# Patient Record
Sex: Male | Born: 1965 | Race: Black or African American | Hispanic: No | Marital: Single | Smoking: Never smoker
Health system: Southern US, Community
[De-identification: ages and names within clinical notes are randomized; demographics above are authoritative.]

## PROBLEM LIST (undated history)

## (undated) DIAGNOSIS — K219 Gastro-esophageal reflux disease without esophagitis: Secondary | ICD-10-CM

## (undated) DIAGNOSIS — Z9889 Other specified postprocedural states: Secondary | ICD-10-CM

## (undated) DIAGNOSIS — R109 Unspecified abdominal pain: Secondary | ICD-10-CM

## (undated) DIAGNOSIS — R9431 Abnormal electrocardiogram [ECG] [EKG]: Secondary | ICD-10-CM

## (undated) DIAGNOSIS — A048 Other specified bacterial intestinal infections: Secondary | ICD-10-CM

## (undated) DIAGNOSIS — M722 Plantar fascial fibromatosis: Secondary | ICD-10-CM

## (undated) DIAGNOSIS — E119 Type 2 diabetes mellitus without complications: Secondary | ICD-10-CM

## (undated) DIAGNOSIS — T7840XA Allergy, unspecified, initial encounter: Secondary | ICD-10-CM

## (undated) DIAGNOSIS — J329 Chronic sinusitis, unspecified: Secondary | ICD-10-CM

## (undated) DIAGNOSIS — R112 Nausea with vomiting, unspecified: Secondary | ICD-10-CM

## (undated) DIAGNOSIS — M1990 Unspecified osteoarthritis, unspecified site: Secondary | ICD-10-CM

## (undated) HISTORY — DX: Other specified bacterial intestinal infections: A04.8

## (undated) HISTORY — DX: Chronic sinusitis, unspecified: J32.9

## (undated) HISTORY — DX: Unspecified abdominal pain: R10.9

## (undated) HISTORY — PX: KNEE ARTHROSCOPY W/ MENISCAL REPAIR: SHX1877

## (undated) HISTORY — DX: Plantar fascial fibromatosis: M72.2

## (undated) HISTORY — DX: Allergy, unspecified, initial encounter: T78.40XA

## (undated) HISTORY — DX: Nausea with vomiting, unspecified: R11.2

## (undated) HISTORY — DX: Other specified postprocedural states: Z98.890

## (undated) HISTORY — DX: Abnormal electrocardiogram (ECG) (EKG): R94.31

## (undated) HISTORY — PX: WISDOM TOOTH EXTRACTION: SHX21

## (undated) HISTORY — DX: Unspecified osteoarthritis, unspecified site: M19.90

## (undated) HISTORY — DX: Gastro-esophageal reflux disease without esophagitis: K21.9

---

## 2008-02-10 ENCOUNTER — Ambulatory Visit: Payer: Self-pay | Admitting: Internal Medicine

## 2008-02-17 ENCOUNTER — Ambulatory Visit: Payer: Self-pay | Admitting: Internal Medicine

## 2008-12-23 ENCOUNTER — Encounter: Payer: Self-pay | Admitting: Cardiovascular Disease

## 2008-12-27 ENCOUNTER — Encounter: Payer: Self-pay | Admitting: Cardiovascular Disease

## 2008-12-28 ENCOUNTER — Ambulatory Visit: Payer: Self-pay | Admitting: Cardiovascular Disease

## 2008-12-28 DIAGNOSIS — K219 Gastro-esophageal reflux disease without esophagitis: Secondary | ICD-10-CM

## 2008-12-28 DIAGNOSIS — R9431 Abnormal electrocardiogram [ECG] [EKG]: Secondary | ICD-10-CM

## 2008-12-28 HISTORY — DX: Abnormal electrocardiogram (ECG) (EKG): R94.31

## 2009-01-11 ENCOUNTER — Ambulatory Visit: Payer: Self-pay

## 2009-01-11 ENCOUNTER — Ambulatory Visit: Payer: Self-pay | Admitting: Cardiovascular Disease

## 2009-01-11 ENCOUNTER — Ambulatory Visit (HOSPITAL_COMMUNITY): Admission: RE | Admit: 2009-01-11 | Discharge: 2009-01-11 | Payer: Self-pay | Admitting: Internal Medicine

## 2009-01-11 ENCOUNTER — Encounter: Payer: Self-pay | Admitting: Cardiovascular Disease

## 2012-04-17 ENCOUNTER — Emergency Department (HOSPITAL_BASED_OUTPATIENT_CLINIC_OR_DEPARTMENT_OTHER)
Admission: EM | Admit: 2012-04-17 | Discharge: 2012-04-17 | Disposition: A | Discharge status: Home / Self Care | Payer: 59 | Attending: Emergency Medicine | Admitting: Emergency Medicine

## 2012-04-17 ENCOUNTER — Emergency Department (HOSPITAL_BASED_OUTPATIENT_CLINIC_OR_DEPARTMENT_OTHER): Payer: 59

## 2012-04-17 ENCOUNTER — Encounter (HOSPITAL_BASED_OUTPATIENT_CLINIC_OR_DEPARTMENT_OTHER): Payer: Self-pay | Admitting: *Deleted

## 2012-04-17 DIAGNOSIS — M25529 Pain in unspecified elbow: Secondary | ICD-10-CM

## 2012-04-17 DIAGNOSIS — Y9389 Activity, other specified: Secondary | ICD-10-CM | POA: Insufficient documentation

## 2012-04-17 DIAGNOSIS — S139XXA Sprain of joints and ligaments of unspecified parts of neck, initial encounter: Secondary | ICD-10-CM | POA: Insufficient documentation

## 2012-04-17 DIAGNOSIS — S59909A Unspecified injury of unspecified elbow, initial encounter: Secondary | ICD-10-CM | POA: Insufficient documentation

## 2012-04-17 DIAGNOSIS — M25569 Pain in unspecified knee: Secondary | ICD-10-CM

## 2012-04-17 DIAGNOSIS — S6990XA Unspecified injury of unspecified wrist, hand and finger(s), initial encounter: Secondary | ICD-10-CM | POA: Insufficient documentation

## 2012-04-17 DIAGNOSIS — S8990XA Unspecified injury of unspecified lower leg, initial encounter: Secondary | ICD-10-CM | POA: Insufficient documentation

## 2012-04-17 DIAGNOSIS — S0003XA Contusion of scalp, initial encounter: Secondary | ICD-10-CM | POA: Insufficient documentation

## 2012-04-17 DIAGNOSIS — S161XXA Strain of muscle, fascia and tendon at neck level, initial encounter: Secondary | ICD-10-CM

## 2012-04-17 DIAGNOSIS — Y92410 Unspecified street and highway as the place of occurrence of the external cause: Secondary | ICD-10-CM | POA: Insufficient documentation

## 2012-04-17 DIAGNOSIS — T148XXA Other injury of unspecified body region, initial encounter: Secondary | ICD-10-CM

## 2012-04-17 MED ORDER — HYDROCODONE-ACETAMINOPHEN 5-325 MG PO TABS: 2.0000 | ORAL_TABLET | ORAL | Status: DC | PRN

## 2012-04-17 MED ORDER — CYCLOBENZAPRINE HCL 5 MG PO TABS: 5.0000 mg | ORAL_TABLET | Freq: Two times a day (BID) | ORAL | Status: DC | PRN
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
## 2012-04-17 NOTE — ED Provider Notes (Signed)
Medical screening examination/treatment/procedure(s) were performed by non-physician practitioner and as supervising physician I was immediately available for consultation/collaboration.   Yuvonne Lanahan, MD 04/17/12 2333 

## 2013-01-27 ENCOUNTER — Ambulatory Visit: Payer: Self-pay | Admitting: Podiatry

## 2013-02-23 ENCOUNTER — Encounter: Payer: Self-pay | Admitting: Internal Medicine

## 2013-07-08 ENCOUNTER — Encounter: Payer: Self-pay | Admitting: Internal Medicine

## 2013-07-28 ENCOUNTER — Ambulatory Visit (INDEPENDENT_AMBULATORY_CARE_PROVIDER_SITE_OTHER): Payer: PRIVATE HEALTH INSURANCE | Admitting: Family Medicine

## 2013-07-28 ENCOUNTER — Encounter: Payer: Self-pay | Admitting: Family Medicine

## 2013-07-28 VITALS — BP 124/80 | HR 85 | Temp 98.1°F | Ht 73.0 in | Wt 236.5 lb

## 2013-07-28 DIAGNOSIS — M170 Bilateral primary osteoarthritis of knee: Secondary | ICD-10-CM

## 2013-07-28 DIAGNOSIS — R109 Unspecified abdominal pain: Secondary | ICD-10-CM

## 2013-07-28 DIAGNOSIS — M1710 Unilateral primary osteoarthritis, unspecified knee: Secondary | ICD-10-CM

## 2013-07-28 DIAGNOSIS — K219 Gastro-esophageal reflux disease without esophagitis: Secondary | ICD-10-CM

## 2013-07-28 DIAGNOSIS — M722 Plantar fascial fibromatosis: Secondary | ICD-10-CM

## 2013-07-28 DIAGNOSIS — IMO0002 Reserved for concepts with insufficient information to code with codable children: Secondary | ICD-10-CM

## 2013-07-28 DIAGNOSIS — A048 Other specified bacterial intestinal infections: Secondary | ICD-10-CM

## 2013-07-28 NOTE — Progress Notes (Signed)
No chief complaint on file.   HPI:  Marcus Wilkins is here to establish care.  Last PCP and physical:  Has the following chronic problems and concerns today:  Patient Active Problem List   Diagnosis Date Noted  . Plantar fasciitis 07/28/2013  . Osteoarthritis of both knees 07/28/2013  . H. pylori infection 07/28/2013  . GERD 12/28/2008   R upper abd pain: -started several months ago, then started hurting in LRQ -on and off -burning pain -[facility] and reports then did labs and reported told infectionand ulcer and treated with a month of antibiotics -reports feeling better but still has occ burning in specific area and has "knot" there -denies: fevers, diarrhea, vomiting, nausea, vomiting, blood in stool, melena, weight loss -does have acid reflux -reports has colonoscopy set up for next month - he wants to see Dr. Leone PayorGessner for appt for this pain -when his abd pain is bad his chest hurts as well - hx stress test 1 year ago per his report  Health Maintenance: -will set up physical  ROS: See pertinent positives and negatives per HPI.  Past Medical History  Diagnosis Date  . Arthritis   . GERD (gastroesophageal reflux disease)   . Ulcer   . Allergy   . Plantar fasciitis   . ELECTROCARDIOGRAM, ABNORMAL 12/28/2008    Qualifier: Diagnosis of  By: Eden EmmsNishan, MD, Harrington ChallengerFACC, Peter Charles     Family History  Problem Relation Age of Onset  . Stroke Mother   . Heart disease Mother     valvular  . Stroke Father   . Diabetes Father   . Hypertension Father     History   Social History  . Marital Status: Single    Spouse Name: N/A    Number of Children: N/A  . Years of Education: N/A   Social History Main Topics  . Smoking status: Never Smoker   . Smokeless tobacco: None  . Alcohol Use: Yes     Comment: 1-2 drinks a few times per year  . Drug Use: No  . Sexual Activity: No   Other Topics Concern  . 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  GENERAL: vitals reviewed and listed above, alert, oriented, appears well hydrated and in no acute distress  HEENT: atraumatic, conjunttiva clear, no obvious abnormalities on inspection of external nose and ears  NECK: no obvious masses on inspection  LUNGS: clear to auscultation bilaterally, no wheezes, rales or rhonchi, good air movement  CV: HRRR, no peripheral edema  MS: moves all extremities without noticeable abnormality  PSYCH: pleasant and cooperative, no obvious depression or anxiety  ASSESSMENT AND PLAN:  Discussed the following assessment and plan:  GERD - Plan: Ambulatory referral to Gastroenterology  H. pylori infection - Plan: Ambulatory  referral to Gastroenterology  Abdominal pain - Plan: Ambulatory referral to Gastroenterology  Plantar fasciitis  Osteoarthritis of both knees  -We reviewed the PMH, PSH, FH, SH, Meds and Allergies. -We provided refills for any medications we will prescribe as needed. -We addressed current concerns per orders and patient instructions. -We have asked for records for pertinent exams, studies, vaccines and notes from previous providers. -We have advised patient to follow up per instructions below. -advised assistant to get records from urgent care to review -he will complete tx for likely h. Pylori, referral per his request -follow up 1 month for CPE and labs -offered tdap - declined today but wants to get with his  physical  -Patient advised to return or notify a doctor immediately if symptoms worsen or persist or new concerns arise.  Patient Instructions  -We placed a referral for you as discussed to the gastroenterologist. It usually takes about 1-2 weeks to process and schedule this referral. If you have not heard from us regarding this appointment in 2 weeks please contact our office.  -Set up appointment for physical exam and come fasting - drink plenty of water that morning     Terressa KoyanagiHannah R. Kylyn Sookram

## 2013-07-28 NOTE — Progress Notes (Signed)
Pre visit review using our clinic review tool, if applicable. No additional management support is needed unless otherwise documented below in the visit note. 

## 2013-07-28 NOTE — Patient Instructions (Signed)
-  We placed a referral for you as discussed to the gastroenterologist. It usually takes about 1-2 weeks to process and schedule this referral. If you have not heard from us regarding this appointment in 2 weeks please contact our office.  -Set up appointment for physical exam and come fasting - drink plenty of water that morning

## 2013-07-30 ENCOUNTER — Encounter: Payer: Self-pay | Admitting: Family Medicine

## 2013-07-31 ENCOUNTER — Ambulatory Visit: Payer: 59 | Admitting: Internal Medicine

## 2013-08-18 ENCOUNTER — Ambulatory Visit (AMBULATORY_SURGERY_CENTER): Payer: PRIVATE HEALTH INSURANCE

## 2013-08-18 VITALS — Ht 73.5 in | Wt 236.0 lb

## 2013-08-18 DIAGNOSIS — Z800 Family history of malignant neoplasm of digestive organs: Secondary | ICD-10-CM

## 2013-08-18 MED ORDER — NA SULFATE-K SULFATE-MG SULF 17.5-3.13-1.6 GM/177ML PO SOLN: ORAL | Status: DC

## 2013-08-18 NOTE — Progress Notes (Signed)
Per pt, no allergies to soy or egg products.Pt not taking any weight loss meds or using  O2 at home.Pt states he has had post-op nausea and vomiting 1 time

## 2013-08-21 ENCOUNTER — Other Ambulatory Visit (INDEPENDENT_AMBULATORY_CARE_PROVIDER_SITE_OTHER): Payer: PRIVATE HEALTH INSURANCE

## 2013-08-21 DIAGNOSIS — R17 Unspecified jaundice: Secondary | ICD-10-CM

## 2013-08-21 DIAGNOSIS — E785 Hyperlipidemia, unspecified: Secondary | ICD-10-CM

## 2013-08-21 DIAGNOSIS — Z0000 Encounter for general adult medical examination without abnormal findings: Secondary | ICD-10-CM

## 2013-08-21 LAB — TSH: TSH: 1.26 u[IU]/mL (ref 0.35–4.50)

## 2013-08-21 LAB — POCT URINALYSIS DIPSTICK
Bilirubin, UA: NEGATIVE
Blood, UA: NEGATIVE
Glucose, UA: NEGATIVE
Ketones, UA: NEGATIVE
LEUKOCYTES UA: NEGATIVE
NITRITE UA: NEGATIVE
PH UA: 6
PROTEIN UA: NEGATIVE
Spec Grav, UA: 1.025
UROBILINOGEN UA: 1

## 2013-08-21 LAB — BASIC METABOLIC PANEL
BUN: 13 mg/dL (ref 6–23)
CALCIUM: 9.3 mg/dL (ref 8.4–10.5)
CO2: 27 meq/L (ref 19–32)
Chloride: 103 mEq/L (ref 96–112)
Creatinine, Ser: 1 mg/dL (ref 0.4–1.5)
GFR: 102.84 mL/min (ref >60.00)
GLUCOSE: 86 mg/dL (ref 70–99)
POTASSIUM: 4.2 meq/L (ref 3.5–5.1)
SODIUM: 139 meq/L (ref 135–145)

## 2013-08-21 LAB — LIPID PANEL
CHOL/HDL RATIO: 5
Cholesterol: 179 mg/dL (ref 0–200)
HDL: 36.3 mg/dL — AB (ref >39.00)
LDL Cholesterol: 110 mg/dL — ABNORMAL HIGH (ref 0–99)
Triglycerides: 165 mg/dL — ABNORMAL HIGH (ref 0.0–149.0)
VLDL: 33 mg/dL (ref 0.0–40.0)

## 2013-08-21 LAB — CBC WITH DIFFERENTIAL/PLATELET
BASOS ABS: 0 10*3/uL (ref 0.0–0.1)
BASOS PCT: 0.4 % (ref 0.0–3.0)
Eosinophils Absolute: 0.4 10*3/uL (ref 0.0–0.7)
Eosinophils Relative: 6.2 % — ABNORMAL HIGH (ref 0.0–5.0)
HEMATOCRIT: 48.7 % (ref 39.0–52.0)
HEMOGLOBIN: 16.1 g/dL (ref 13.0–17.0)
LYMPHS ABS: 2.1 10*3/uL (ref 0.7–4.0)
LYMPHS PCT: 29.6 % (ref 12.0–46.0)
MCHC: 33 g/dL (ref 30.0–36.0)
MCV: 88.6 fl (ref 78.0–100.0)
MONOS PCT: 7.7 % (ref 3.0–12.0)
Monocytes Absolute: 0.6 10*3/uL (ref 0.1–1.0)
NEUTROS ABS: 4 10*3/uL (ref 1.4–7.7)
Neutrophils Relative %: 56.1 % (ref 43.0–77.0)
Platelets: 215 10*3/uL (ref 150.0–400.0)
RBC: 5.5 Mil/uL (ref 4.22–5.81)
RDW: 14.5 % (ref 11.5–15.5)
WBC: 7.2 10*3/uL (ref 4.0–10.5)

## 2013-08-21 LAB — HEPATIC FUNCTION PANEL
ALBUMIN: 3.9 g/dL (ref 3.5–5.2)
ALT: 30 U/L (ref 0–53)
AST: 23 U/L (ref 0–37)
Alkaline Phosphatase: 74 U/L (ref 39–117)
Bilirubin, Direct: 0.3 mg/dL (ref 0.0–0.3)
TOTAL PROTEIN: 7.1 g/dL (ref 6.0–8.3)
Total Bilirubin: 1.8 mg/dL — ABNORMAL HIGH (ref 0.2–1.2)

## 2013-08-21 LAB — PSA: PSA: 1.17 ng/mL (ref 0.10–4.00)

## 2013-08-28 ENCOUNTER — Ambulatory Visit (INDEPENDENT_AMBULATORY_CARE_PROVIDER_SITE_OTHER): Payer: PRIVATE HEALTH INSURANCE | Admitting: Family Medicine

## 2013-08-28 ENCOUNTER — Encounter: Payer: Self-pay | Admitting: Family Medicine

## 2013-08-28 VITALS — BP 126/82 | HR 67 | Temp 98.0°F | Ht 73.75 in | Wt 237.0 lb

## 2013-08-28 DIAGNOSIS — Z0000 Encounter for general adult medical examination without abnormal findings: Secondary | ICD-10-CM

## 2013-08-28 DIAGNOSIS — R17 Unspecified jaundice: Secondary | ICD-10-CM

## 2013-08-28 DIAGNOSIS — Z23 Encounter for immunization: Secondary | ICD-10-CM

## 2013-08-28 DIAGNOSIS — R1011 Right upper quadrant pain: Secondary | ICD-10-CM

## 2013-08-28 DIAGNOSIS — A048 Other specified bacterial intestinal infections: Secondary | ICD-10-CM

## 2013-08-28 DIAGNOSIS — E785 Hyperlipidemia, unspecified: Secondary | ICD-10-CM

## 2013-08-28 NOTE — Progress Notes (Signed)
No chief complaint on file.   HPI:  Annual Exam:  Follow up and/or new issues:  1) GERD/Hx H. Pylori Infection 06/2013: -referred to GI for ongoing symptoms after tx on 07/28/13 -reports:is doing better, has colonoscopy this Tuesday, intermittent pain in RUQ daily, mild-mod, sharp - not sure if related to meals - reports he is just going to get the colonscopy and talk to the doctor at his visit per his discussion with GI office -denies: fevers, malaise, anorexia, NVD, constipation, hematochezia, melena, did have elevated bili on labs, reflux, heartburn, trouble swallowing, alcohol use  2) HLD: -mild, discussed -he prefers to work on diet and exercise  HM: -diet and exercise: no CV, diet is healthier - cutting back on fast food and red meat -vaccines: getting tdap today -lipids/diabetes screening: done and cholesterol a little elevated -STI screening: declined  -see social hx for substance use  Review of Systems  Constitutional: Negative for fever, chills, weight loss and malaise/fatigue.  HENT: Negative for ear pain and hearing loss.   Eyes: Negative for blurred vision and pain.  Respiratory: Negative for cough and shortness of breath.   Cardiovascular: Negative for chest pain, palpitations and leg swelling.  Gastrointestinal: Negative for diarrhea, blood in stool and melena.  Genitourinary: Negative for dysuria.  Musculoskeletal: Negative for falls and joint pain.  Skin: Negative for rash.  Neurological: Negative for dizziness and loss of consciousness.  Endo/Heme/Allergies: Does not bruise/bleed easily.  Psychiatric/Behavioral: Negative for depression. The patient is not nervous/anxious.      Past Medical History  Diagnosis Date  . Arthritis   . GERD (gastroesophageal reflux disease)   . Ulcer   . Allergy   . Plantar fasciitis   . ELECTROCARDIOGRAM, ABNORMAL 12/28/2008    Qualifier: Diagnosis of  By: Eden EmmsNishan, MD, Harrington ChallengerFACC, Peter Charles   . Abdominal pain     right side  radiating to left side  . Post-operative nausea and vomiting     1 time    Past Surgical History  Procedure Laterality Date  . Knee arthroscopy w/ meniscal repair      bilateral  . Wisdom tooth extraction      Family History  Problem Relation Age of Onset  . Stroke Mother   . Heart disease Mother     valvular  . Colon cancer Mother   . Stroke Father   . Diabetes Father   . Hypertension Father     History   Social History  . Marital Status: Single    Spouse Name: N/A    Number of Children: N/A  . Years of Education: N/A   Social History Main Topics  . Smoking status: Never Smoker   . Smokeless tobacco: Never Used  . Alcohol Use: No     Comment: 1-2 drinks a few times per year  . Drug Use: No  . Sexual Activity: No   Other Topics Concern  . 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  EXAM:  Filed Vitals:   08/28/13 1446  BP: 126/82  Pulse: 67  Temp: 98 F (36.7 C)    Body mass index is 30.64 kg/(m^2).  GENERAL: vitals reviewed and listed above, alert, oriented, appears well hydrated and in no acute distress  HEENT: atraumatic, conjunttiva clear, no obvious abnormalities on inspection of external nose and ears  NECK: no obvious masses on inspection  LUNGS: clear to auscultation bilaterally, no wheezes, rales or rhonchi, good air movement  CV: HRRR, no peripheral edema  ABD: BS+, soft, NTTP, no rebound or guarding, no appreciable organomegaly  MS: moves all extremities without noticeable abnormality  PSYCH: pleasant and cooperative, no obvious depression or anxiety  ASSESSMENT AND PLAN:  Discussed the following assessment and plan:  Visit for preventive health examination  Hyperlipemia  RUQ pain  Elevated  bilirubin  H. pylori infection  -USPSTF level A and B recs discussed -LABS including PSA done and discussed today -offered tdap: getting today -advised he see GI for his intermittent persistent daily RUQ pain and elevated t. Bili (referral placed last visit) -  will have office staff check on status of this and see if they wish to see him prior to doing colonoscopy or reschedule colonoscopy in case EGD is warranted to avoid sedation twice -discussed options for HLD, pt will do trial of diet and exercise and follow up in 4-6 months for this -Patient advised to return or notify a doctor immediately if symptoms worsen or persist or new concerns arise.  There are no Patient Instructions on file for this visit.   Terressa Koyanagi

## 2013-08-28 NOTE — Progress Notes (Signed)
Pre visit review using our clinic review tool, if applicable. No additional management support is needed unless otherwise documented below in the visit note. 

## 2013-09-01 ENCOUNTER — Ambulatory Visit (AMBULATORY_SURGERY_CENTER): Payer: PRIVATE HEALTH INSURANCE | Admitting: Internal Medicine

## 2013-09-01 ENCOUNTER — Encounter: Payer: Self-pay | Admitting: Internal Medicine

## 2013-09-01 ENCOUNTER — Telehealth: Payer: Self-pay

## 2013-09-01 VITALS — BP 118/85 | HR 69 | Temp 97.6°F | Resp 26 | Ht 73.5 in | Wt 236.0 lb

## 2013-09-01 DIAGNOSIS — Z800 Family history of malignant neoplasm of digestive organs: Secondary | ICD-10-CM

## 2013-09-01 DIAGNOSIS — R1011 Right upper quadrant pain: Secondary | ICD-10-CM

## 2013-09-01 DIAGNOSIS — Z1211 Encounter for screening for malignant neoplasm of colon: Secondary | ICD-10-CM

## 2013-09-01 MED ORDER — SODIUM CHLORIDE 0.9 % IV SOLN: 500.0000 mL | INTRAVENOUS | Status: DC

## 2013-09-01 NOTE — Patient Instructions (Addendum)
Your colonoscopy was normal. Next routine colonoscopy in 5 years - 2020  My RN will arrange an ultrasound to evaluate your abdominal pain.  I appreciate the opportunity to care for you. Iva Boop, MD, [facility]   Discharge instructions given with verbal understanding. Normal exam. Office will schedule ultrasound. Resume previous medications. YOU HAD AN ENDOSCOPIC PROCEDURE TODAY AT THE [facility]: Refer to the procedure report that was given to you for any specific questions about what was found during the examination.  If the procedure report does not answer your questions, please call your gastroenterologist to clarify.  If you requested that your care partner not be given the details of your procedure findings, then the procedure report has been included in a sealed envelope for you to review at your convenience later.  YOU SHOULD EXPECT: Some feelings of bloating in the abdomen. Passage of more gas than usual.  Walking can help get rid of the air that was put into your GI tract during the procedure and reduce the bloating. If you had a lower endoscopy (such as a colonoscopy or flexible sigmoidoscopy) you may notice spotting of blood in your stool or on the toilet paper. If you underwent a bowel prep for your procedure, then you may not have a normal bowel movement for a few days.  DIET: Your first meal following the procedure should be a light meal and then it is ok to progress to your normal diet.  A half-sandwich or bowl of soup is an example of a good first meal.  Heavy or fried foods are harder to digest and may make you feel nauseous or bloated.  Likewise meals heavy in dairy and vegetables can cause extra gas to form and this can also increase the bloating.  Drink plenty of fluids but you should avoid alcoholic beverages for 24 hours.  ACTIVITY: Your care partner should take you home directly after the procedure.  You should plan to take it easy, moving slowly for the rest  of the day.  You can resume normal activity the day after the procedure however you should NOT DRIVE or use heavy machinery for 24 hours (because of the sedation medicines used during the test).    SYMPTOMS TO REPORT IMMEDIATELY: A gastroenterologist can be reached at any hour.  During normal business hours, 8:30 AM to 5:00 PM Monday through Friday, call [phone].  After hours and on weekends, please call the GI answering service at [phone] who will take a message and have the physician on call contact you.   Following lower endoscopy (colonoscopy or flexible sigmoidoscopy):  Excessive amounts of blood in the stool  Significant tenderness or worsening of abdominal pains  Swelling of the abdomen that is new, acute  Fever of 100F or higher  FOLLOW UP: If any biopsies were taken you will be contacted by phone or by letter within the next 1-3 weeks.  Call your gastroenterologist if you have not heard about the biopsies in 3 weeks.  Our staff will call the home number listed on your records the next business day following your procedure to check on you and address any questions or concerns that you may have at that time regarding the information given to you following your procedure. This is a courtesy call and so if there is no answer at the home number and we have not heard from you through the emergency physician on call, we will assume that you have returned to your  regular daily activities without incident.  SIGNATURES/CONFIDENTIALITY: You and/or your care partner have signed paperwork which will be entered into your electronic medical record.  These signatures attest to the fact that that the information above on your After Visit Summary has been reviewed and is understood.  Full responsibility of the confidentiality of this discharge information lies with you and/or your care-partner.

## 2013-09-01 NOTE — Progress Notes (Signed)
Pt stable to RR 

## 2013-09-01 NOTE — Op Note (Signed)
[facility] [address] [place], [address]   COLONOSCOPY PROCEDURE REPORT  PATIENT: Marcus Wilkins, Marcus Wilkins  MR#: [id number] BIRTHDATE: 1966/01/17 , 48  yrs. old GENDER: Male ENDOSCOPIST: Iva Boop, MD, [facility] PROCEDURE DATE:  09/01/2013 PROCEDURE:   Colonoscopy, screening First Screening Colonoscopy - Avg.  risk and is 50 yrs.  old or older - No.  Prior Negative Screening - Now for repeat screening. Above average risk  History of Adenoma - Now for follow-up colonoscopy & has been > or = to 3 yrs.  N/A  Polyps Removed Today? No.  Recommend repeat exam, <10 yrs? Yes.  High risk (family or personal hx). ASA CLASS:   Class I INDICATIONS:elevated risk screening and Patient's immediate family history of colon cancer. MEDICATIONS: propofol (Diprivan) 300mg  IV, MAC sedation, administered by CRNA, and These medications were titrated to patient response per physician's verbal order  DESCRIPTION OF PROCEDURE:   After the risks benefits and alternatives of the procedure were thoroughly explained, informed consent was obtained.  A digital rectal exam revealed no abnormalities of the rectum.   The LB [id number]  endoscope was introduced through the anus and advanced to the cecum, which was identified by both the appendix and ileocecal valve. No adverse events experienced.   The quality of the prep was excellent using Suprep  The instrument was then slowly withdrawn as the colon was fully examined.      COLON FINDINGS: A normal appearing cecum, ileocecal valve, and appendiceal orifice were identified.  The ascending, hepatic flexure, transverse, splenic flexure, descending, sigmoid colon and rectum appeared unremarkable.  No polyps or cancers were seen.   A right colon retroflexion was performed.  Retroflexed views revealed no abnormalities. The time to cecum=2 minutes 04 seconds. Withdrawal time=6 minutes 40 seconds.  The scope was withdrawn and the procedure  completed. COMPLICATIONS: There were no complications.  ENDOSCOPIC IMPRESSION: Normal colonoscopy - excellent prep - FHx colon cancer mom in 50's  RECOMMENDATIONS: 1.  Repeat Colonoscopy in 5 years. 2.   My office will arrange for abdominal ultrasound to evaluate intermittent RUQ pain   eSigned:  Iva Boop, MD, [facility] 09/01/2013 9:53 AM   cc: The Patient  and Kriste Basque, DO

## 2013-09-01 NOTE — Telephone Encounter (Signed)
I have left a message for the patient to call and discuss abdominal Ultrasound scheduled at [facility] for 09/08/13 8:00

## 2013-09-02 ENCOUNTER — Telehealth: Payer: Self-pay

## 2013-09-02 NOTE — Telephone Encounter (Signed)
Patient's father notified of the details of the [place] he will have the patient call back for any additional questions or concerns

## 2013-09-02 NOTE — Telephone Encounter (Signed)
Left a message at [phone] for the pt to call if any questions or concerns. Maw

## 2013-09-08 ENCOUNTER — Ambulatory Visit (HOSPITAL_COMMUNITY)
Admission: RE | Admit: 2013-09-08 | Discharge: 2013-09-08 | Disposition: A | Discharge status: Home / Self Care | Payer: 59 | Source: Ambulatory Visit | Attending: Internal Medicine | Admitting: Internal Medicine

## 2013-09-08 ENCOUNTER — Other Ambulatory Visit: Payer: Self-pay

## 2013-09-08 DIAGNOSIS — R1011 Right upper quadrant pain: Secondary | ICD-10-CM

## 2013-09-08 DIAGNOSIS — K8020 Calculus of gallbladder without cholecystitis without obstruction: Secondary | ICD-10-CM | POA: Insufficient documentation

## 2013-09-08 DIAGNOSIS — K819 Cholecystitis, unspecified: Secondary | ICD-10-CM

## 2013-09-08 NOTE — Progress Notes (Signed)
Quick Note:  US shows gallstones which may be cause of his sxs  I recommend he see a surgeon re: ? Symptomatic cholelithiasis  Please get him an appointment with Dr. Dwain SarnaWakefield ______

## 2013-09-29 ENCOUNTER — Encounter (INDEPENDENT_AMBULATORY_CARE_PROVIDER_SITE_OTHER): Payer: Self-pay | Admitting: General Surgery

## 2013-09-29 ENCOUNTER — Ambulatory Visit (INDEPENDENT_AMBULATORY_CARE_PROVIDER_SITE_OTHER): Payer: 59 | Admitting: General Surgery

## 2013-09-29 VITALS — BP 126/77 | HR 70 | Temp 97.0°F | Resp 16 | Ht 73.5 in | Wt 238.8 lb

## 2013-09-29 DIAGNOSIS — K8020 Calculus of gallbladder without cholecystitis without obstruction: Secondary | ICD-10-CM

## 2013-09-29 NOTE — Patient Instructions (Signed)

## 2013-09-29 NOTE — Progress Notes (Signed)
Patient ID: Marcus Wilkins, male   DOB: 1965-06-09, 48 y.o.   MRN: [id number]  Chief Complaint  Patient presents with  . Abdominal Pain    gallbladder    HPI Marcus Wilkins is a 48 y.o. male.  referred by Dr Verl Bangsarl GessnerHPI This is a 48 year old male who has had abdominal pain for close to about 3 years. This comes and goes. He does not really relate this to anything. It has become more frequent and it hurts more. His occurring several times per week. He points to his right upper quadrant when asked where it hurts. Usually only lasts for a number of minutes and then goes away on its own. He has undergone a recent colonoscopy. He has also undergone evaluation for this pain with an ultrasound that shows numerous stones in his gallbladder. He has no nausea or vomiting associated with this. He has no history of any scleral icterus. Past Medical History  Diagnosis Date  . Arthritis   . GERD (gastroesophageal reflux disease)   . Ulcer   . Allergy   . Plantar fasciitis   . ELECTROCARDIOGRAM, ABNORMAL 12/28/2008    Qualifier: Diagnosis of  By: Eden EmmsNishan, MD, Harrington ChallengerFACC, Peter Charles   . Abdominal pain     right side radiating to left side  . Post-operative nausea and vomiting     1 time    Past Surgical History  Procedure Laterality Date  . Knee arthroscopy w/ meniscal repair      bilateral  . Wisdom tooth extraction      Family History  Problem Relation Age of Onset  . Stroke Mother   . Heart disease Mother     valvular  . Colon cancer Mother   . Stroke Father   . Diabetes Father   . Hypertension Father     Social History History  Substance Use Topics  . Smoking status: Never Smoker   . Smokeless tobacco: Never Used  . Alcohol Use: No     Comment: 1-2 drinks a few times per year    No Known Allergies  No current outpatient prescriptions on file.   No current facility-administered medications for this visit.    Review of Systems Review of Systems  Constitutional: Negative for  fever, chills and unexpected weight change.  HENT: Negative for congestion, hearing loss, sore throat, trouble swallowing and voice change.   Eyes: Negative for visual disturbance.  Respiratory: Negative for cough and wheezing.   Cardiovascular: Negative for chest pain, palpitations and leg swelling.  Gastrointestinal: Positive for abdominal pain. Negative for nausea, vomiting, diarrhea, constipation, blood in stool, abdominal distention, anal bleeding and rectal pain.  Genitourinary: Negative for hematuria and difficulty urinating.  Musculoskeletal: Negative for arthralgias.  Skin: Negative for rash and wound.  Neurological: Negative for seizures, syncope, weakness and headaches.  Hematological: Negative for adenopathy. Does not bruise/bleed easily.  Psychiatric/Behavioral: Negative for confusion.    Blood pressure 126/77, pulse 70, temperature 97 F (36.1 C), temperature source Temporal, resp. rate 16, height 6' 1.5" (1.867 m), weight 238 lb 12.8 oz (108.319 kg).  Physical Exam Physical Exam  Vitals reviewed. Constitutional: He is oriented to person, place, and time. He appears well-developed and well-nourished.  Eyes: No scleral icterus.  Neck: Neck supple.  Cardiovascular: Normal rate, regular rhythm and normal heart sounds.   Pulmonary/Chest: Effort normal and breath sounds normal. He has no wheezes. He has no rales.  Abdominal: Soft. Bowel sounds are normal. There is no tenderness.  Lymphadenopathy:  He has no cervical adenopathy.  Neurological: He is alert and oriented to person, place, and time.    Data Reviewed EXAM:  ULTRASOUND ABDOMEN COMPLETE  COMPARISON: None.  FINDINGS:  Gallbladder:  Wall echo shadow sign indicating a gallbladder filled with stones.  Gallbladder wall was not thickened, measuring 2 mm. Sonographic  Murphy sign was negative.  Common bile duct:  Diameter: 4 mm  Liver:  Heterogeneous appearance of the liver with geographic areas of  decreased  echogenicity on a background of increased parenchymal  echogenicity.  IVC:  No abnormality visualized.  Pancreas:  Not visualized.  Spleen:  Size and appearance within normal limits.  Right Kidney:  Length: 11.5 cm. Echogenicity within normal limits. No mass or  hydronephrosis visualized.  Left Kidney:  Length: 11.9 cm. Echogenicity within normal limits. No mass or  hydronephrosis visualized.  Abdominal aorta:  No aneurysm identified. Proximal abdominal aorta obscured.  Other findings:  None.  IMPRESSION:  1. Cholelithiasis. No evidence of acute cholecystitis. No biliary  dilatation.  2. Heterogeneous appearance of the liver most suggestive of  steatosis with geographic areas of fatty sparing.   Assessment    symptomatic cholelithiasis   Plan    I do think symptoms are from gallbladder and recommended lap chole. I discussed the procedure in detail.   We discussed the risks and benefits of a laparoscopic cholecystectomy and possible cholangiogram including, but not limited to bleeding, infection, injury to surrounding structures such as the intestine or liver, bile leak, retained gallstones, need to convert to an open procedure, prolonged diarrhea, blood clots such as  DVT, common bile duct injury, anesthesia risks, and possible need for additional procedures.  The likelihood of improvement in symptoms and return to the patient's normal status is good. We discussed the typical post-operative recovery course.         Marcus Wilkins 09/29/2013, 1:38 PM

## 2014-01-28 ENCOUNTER — Encounter: Payer: PRIVATE HEALTH INSURANCE | Admitting: Family Medicine

## 2014-01-28 DIAGNOSIS — Z0289 Encounter for other administrative examinations: Secondary | ICD-10-CM

## 2014-01-28 NOTE — Progress Notes (Signed)
Error   This encounter was created in error - please disregard. 

## 2014-08-18 ENCOUNTER — Telehealth: Payer: Self-pay | Admitting: Family Medicine

## 2014-08-18 NOTE — Telephone Encounter (Signed)
Patient Name: Marcus Wilkins  DOB: February 03, 1966    Initial Comment Caller states partner has been having headaches, sharp pains    Nurse Assessment  Nurse: Scarlette ArStandifer, RN, Heather Date/Time (Eastern Time): 08/18/2014 12:34:53 PM  Confirm and document reason for call. If symptomatic, describe symptoms. ---Caller states partner has been having headaches for about a week, sharp pains on the left side of his temple that come and go and a constant headache too.  Has the patient traveled out of the country within the last 30 days? ---Not Applicable  Does the patient require triage? ---Yes  Related visit to physician within the last 2 weeks? ---No  Does the PT have any chronic conditions? (i.e. diabetes, asthma, etc.) ---Yes  List chronic conditions. ---gallbladder probs     Guidelines    Guideline Title Affirmed Question Affirmed Notes  Headache [1] MODERATE headache (e.g., interferes with normal activities) AND [2] present > 24 hours AND [3] unexplained (Exceptions: analgesics not tried, typical migraine, or headache part of viral illness)    Final Disposition User   See Physician within 24 Hours Standifer, RN, Research scientist (physical sciences)Heather    Comments  Appt. made with Dr. Selena BattenKim for tomorrow morning at 9:15 am.

## 2014-08-19 ENCOUNTER — Encounter: Payer: Self-pay | Admitting: Family Medicine

## 2014-08-19 ENCOUNTER — Ambulatory Visit (INDEPENDENT_AMBULATORY_CARE_PROVIDER_SITE_OTHER): Payer: PRIVATE HEALTH INSURANCE | Admitting: Family Medicine

## 2014-08-19 VITALS — BP 130/90 | Temp 98.0°F | Wt 243.6 lb

## 2014-08-19 DIAGNOSIS — J019 Acute sinusitis, unspecified: Secondary | ICD-10-CM

## 2014-08-19 DIAGNOSIS — R51 Headache: Secondary | ICD-10-CM | POA: Diagnosis not present

## 2014-08-19 DIAGNOSIS — T7840XA Allergy, unspecified, initial encounter: Secondary | ICD-10-CM

## 2014-08-19 DIAGNOSIS — R519 Headache, unspecified: Secondary | ICD-10-CM

## 2014-08-19 LAB — SEDIMENTATION RATE: Sed Rate: 3 mm/hr (ref 0–22)

## 2014-08-19 MED ORDER — AMOXICILLIN-POT CLAVULANATE 875-125 MG PO TABS: 1.0000 | ORAL_TABLET | Freq: Two times a day (BID) | ORAL | Status: DC

## 2014-08-19 MED ORDER — FLUTICASONE PROPIONATE 50 MCG/ACT NA SUSP: 2.0000 | Freq: Every day | NASAL | Status: DC

## 2014-08-19 NOTE — Progress Notes (Signed)
Pre visit review using our clinic review tool, if applicable. No additional management support is needed unless otherwise documented below in the visit note.  HPI:  Acute visit for:  Headaches/Sinus congestion: -started about 2 weeks ago -hx seasonal allergies - has had flare of this recently with increased nasal congestion, sinus pressure -pain in L temple daily, sharp lasts a few minutes -denies: acute vision changes, nausea, vomiting, fever, double vision, weakness, dizziness, has had some white thick mucus, changes in speech or hear -occ mild visual focus issues for some time  ROS: See pertinent positives and negatives per HPI.  Past Medical History  Diagnosis Date  . Arthritis   . GERD (gastroesophageal reflux disease)   . Ulcer   . Allergy   . Plantar fasciitis   . ELECTROCARDIOGRAM, ABNORMAL 12/28/2008    Qualifier: Diagnosis of  By: Eden Emms, MD, Harrington Challenger   . Abdominal pain     right side radiating to left side  . Post-operative nausea and vomiting     1 time    Past Surgical History  Procedure Laterality Date  . Knee arthroscopy w/ meniscal repair      bilateral  . Wisdom tooth extraction      Family History  Problem Relation Age of Onset  . Stroke Mother   . Heart disease Mother     valvular  . Colon cancer Mother   . Stroke Father   . Diabetes Father   . Hypertension Father     History   Social History  . Marital Status: Single    Spouse Name: N/A  . Number of Children: N/A  . Years of Education: N/A   Social History Main Topics  . Smoking status: Never Smoker   . Smokeless tobacco: Never Used  . Alcohol Use: No     Comment: 1-2 drinks a few times per year  . Drug Use: No  . Sexual Activity: No   Other Topics Concern  . None   Social History Narrative   Work or School: post Geophysicist/field seismologist Situation: lives with parents      Spiritual Beliefs: Christian      Lifestyle: no CV exercise; diet is good                     Current outpatient prescriptions:  .  amoxicillin-clavulanate (AUGMENTIN) 875-125 MG per tablet, Take 1 tablet by mouth 2 (two) times daily., Disp: 14 tablet, Rfl: 0 .  fluticasone (FLONASE) 50 MCG/ACT nasal spray, Place 2 sprays into both nostrils daily., Disp: 16 g, Rfl: 1  EXAM:  Filed Vitals:   08/19/14 1006  BP: 130/90  Temp: 98 F (36.7 C)    Body mass index is 31.7 kg/(m^2).  GENERAL: vitals reviewed and listed above, alert, oriented, appears well hydrated and in no acute distress  HEENT: atraumatic, conjunttiva clear, PERRLA, visual acuity grossly intact, no obvious abnormalities on inspection of external nose and ears, normal appearance of ear canals and TMs except for clear effusion on L, Thick white nasal congestion, mild post oropharyngeal erythema with PND, no tonsillar edema or exudate, no sinus TTP, no temp art bruit or TTP  NECK: no obvious masses on inspection, no meningeal signs, no carotid bruit  LUNGS: clear to auscultation bilaterally, no wheezes, rales or rhonchi, good air movement  CV: HRRR, no peripheral edema  MS: moves all extremities without noticeable abnormality  PSYCH: pleasant and cooperative, no obvious depression  or anxiety  NEURO: CN II-XII grossly intact, finger to nose normal, no facial asymmetry, normal gait, normal speech,thought processing grossly intact  ASSESSMENT AND PLAN:  Discussed the following assessment and plan:  Acute sinusitis, recurrence not specified, unspecified location - Plan: amoxicillin-clavulanate (AUGMENTIN) 875-125 MG per tablet  Nonintractable headache, unspecified chronicity pattern, unspecified headache type - Plan: Sedimentation Rate  Allergic reaction, initial encounter - Plan: fluticasone (FLONASE) 50 MCG/ACT nasal spray  -suspect his head pain related to the worsening sinus issues and likely sinusitis - discussed other causes of head pain -advised tx for his allergies and sinusitis -advised eye  exam and number given to call to set up optho eval -also advised labs and offered MRI given headaches; certainely would advise if HAs persist despite tx above - he opted to hold on MRI for now -return and ED precautions -advised close follow up -also noted he has not been in for some time, not fasting but will need basic labs and regular follow up and advised this -Patient advised to return or notify a doctor immediately if symptoms worsen or persist or new concerns arise.  Patient Instructions  BEFORE YOU LEAVE: -labs -follow up appointment in 2 weeks  Take the antibiotic as instructed  Flonase 2 sprays each nostril daily for 21 days  Zyrtec once nightly  Call for eye appointment per numbers provied  Seek care immediately if headaches worsening or new symptoms     Marcus Wilkins R.

## 2014-08-19 NOTE — Patient Instructions (Addendum)
BEFORE YOU LEAVE: -labs -follow up appointment in 2 weeks  Take the antibiotic as instructed  Flonase 2 sprays each nostril daily for 21 days  Zyrtec once nightly  Call for eye appointment per numbers provied  Seek care immediately if headaches worsening or new symptoms

## 2014-08-31 ENCOUNTER — Encounter: Payer: Self-pay | Admitting: Family Medicine

## 2014-08-31 ENCOUNTER — Ambulatory Visit (INDEPENDENT_AMBULATORY_CARE_PROVIDER_SITE_OTHER): Payer: PRIVATE HEALTH INSURANCE | Admitting: Family Medicine

## 2014-08-31 VITALS — BP 118/84 | HR 92 | Temp 98.1°F | Ht 73.5 in | Wt 244.9 lb

## 2014-08-31 DIAGNOSIS — R519 Headache, unspecified: Secondary | ICD-10-CM

## 2014-08-31 DIAGNOSIS — R51 Headache: Secondary | ICD-10-CM | POA: Diagnosis not present

## 2014-08-31 DIAGNOSIS — J309 Allergic rhinitis, unspecified: Secondary | ICD-10-CM | POA: Diagnosis not present

## 2014-08-31 DIAGNOSIS — J329 Chronic sinusitis, unspecified: Secondary | ICD-10-CM | POA: Diagnosis not present

## 2014-08-31 NOTE — Patient Instructions (Signed)
BEFORE YOU LEAVE: -follow up in about 3 months for a physical - come fasting, drink plenty of water  -We placed a referral for you as discussed for the MRI of the brain/head. It usually takes about 1-2 weeks to process and schedule this referral. If you have not heard from us regarding this appointment in 2 weeks please contact our office.  Start the zyrtec and follow up with your Ear, Nose and Throat doctor if sinus issues persist

## 2014-08-31 NOTE — Progress Notes (Signed)
HPI:  Follow up headaches/sinusitis/AR: -started about 1 month ago and at the time was struggling from allergies, sinusitis and eye strain -L temporal, sharp and brief pain - better but still happening a few times per day -treated sinusitis and allergies with the abx and advised flonase and zyrtec, ESR normal, advised eye exam and imaging which he declined at the time -reports: saw eye doctor - does have mild poor vision issues;did abx and nasal sprays - doing better but still with a few episodes of the stabbing and puling pain in L temp region and still with nasal congestion and PND -denies: fevers, vision changes, speech problems, weight loss, weakness, numbness, rash  ROS: See pertinent positives and negatives per HPI.  Past Medical History  Diagnosis Date  . Arthritis   . GERD (gastroesophageal reflux disease)   . Ulcer   . Allergy   . Plantar fasciitis   . ELECTROCARDIOGRAM, ABNORMAL 12/28/2008    Qualifier: Diagnosis of  By: Johnsie Cancel, MD, Rona Ravens   . Abdominal pain     right side radiating to left side  . Post-operative nausea and vomiting     1 time    Past Surgical History  Procedure Laterality Date  . Knee arthroscopy w/ meniscal repair      bilateral  . Wisdom tooth extraction      Family History  Problem Relation Age of Onset  . Stroke Mother   . Heart disease Mother     valvular  . Colon cancer Mother   . Stroke Father   . Diabetes Father   . Hypertension Father     History   Social History  . Marital Status: Single    Spouse Name: N/A  . Number of Children: N/A  . Years of Education: N/A   Social History Main Topics  . Smoking status: Never Smoker   . Smokeless tobacco: Never Used  . Alcohol Use: No     Comment: 1-2 drinks a few times per year  . Drug Use: No  . Sexual Activity: No   Other Topics Concern  . 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  fluticasone (FLONASE) 50 MCG/ACT nasal spray, Place 2 sprays into both nostrils daily., Disp: 16 g, Rfl: 1  EXAM:  Filed Vitals:   08/31/14 1021  BP: 118/84  Pulse: 92  Temp: 98.1 F (36.7 C)    Body mass index is 31.87 kg/(m^2).  GENERAL: vitals reviewed and listed above, alert, oriented, appears well hydrated and in no acute distress  HEENT: atraumatic, conjunttiva clear, PERRLA, no obvious abnormalities on inspection of external nose and ears, normal appearance of ear canals and TMs, clear nasal congestion, mild post oropharyngeal erythema with PND, no tonsillar edema or exudate, no sinus TTP  NECK: no obvious masses on inspection, no bruit, no meningeal signs  LUNGS: clear to auscultation bilaterally, no wheezes, rales or rhonchi, good air movement  CV: HRRR, no peripheral edema  MS: moves all extremities without noticeable abnormality  PSYCH: pleasant and cooperative, no obvious depression or anxiety  NEURO: CN II-XII grossly intact, finger to nose normal, gait normal, speech normal, visual  acuity grossly intact, thought processing grossly intact  ASSESSMENT AND PLAN:  Discussed the following assessment and plan:  Allergic rhinitis, unspecified allergic rhinitis type  Nonintractable headache, unspecified chronicity pattern, unspecified headache type - Plan: MR Brain W Wo Contrast  Recurrent sinusitis -advised since still having imaging - he agreed to MRI -if MRI ok would think this is SUNCT or primary stabbing headaches, discussed options for tx - but if MRI ok he decided he prefers no treatment -exam findings suggest allergic rhinosinusitis - advised to add antihistamine and see his ENT doc if symptoms persist in 2 weeks -needs CPE and advised to schedule -Patient advised to return or notify a doctor immediately if symptoms  worsen or persist or new concerns arise.  Patient Instructions  BEFORE YOU LEAVE: -follow up in about 3 months for a physical - come fasting, drink plenty of water  -We placed a referral for you as discussed for the MRI of the brain/head. It usually takes about 1-2 weeks to process and schedule this referral. If you have not heard from [place] regarding this appointment in 2 weeks please contact our office.  Start the zyrtec and follow up with your Ear, Nose and Throat doctor if sinus issues persist       Nuel Dejaynes R.

## 2014-08-31 NOTE — Progress Notes (Signed)
Pre visit review using our clinic review tool, if applicable. No additional management support is needed unless otherwise documented below in the visit note. 

## 2014-10-20 ENCOUNTER — Inpatient Hospital Stay: Admission: RE | Admit: 2014-10-20 | Payer: PRIVATE HEALTH INSURANCE | Source: Ambulatory Visit

## 2014-10-21 ENCOUNTER — Other Ambulatory Visit: Payer: PRIVATE HEALTH INSURANCE

## 2014-10-27 ENCOUNTER — Ambulatory Visit
Admission: RE | Admit: 2014-10-27 | Discharge: 2014-10-27 | Disposition: A | Discharge status: Home / Self Care | Payer: PRIVATE HEALTH INSURANCE | Source: Ambulatory Visit | Attending: Family Medicine | Admitting: Family Medicine

## 2014-10-27 DIAGNOSIS — R519 Headache, unspecified: Secondary | ICD-10-CM

## 2014-10-27 DIAGNOSIS — R51 Headache: Principal | ICD-10-CM

## 2014-10-27 MED ORDER — GADOBENATE DIMEGLUMINE 529 MG/ML IV SOLN
20.0000 mL | Freq: Once | INTRAVENOUS | Status: AC | PRN
  Administered 2014-10-27: 20 mL via INTRAVENOUS

## 2014-11-22 ENCOUNTER — Telehealth: Payer: Self-pay | Admitting: Family Medicine

## 2014-11-22 NOTE — Telephone Encounter (Signed)
Ok. Marcus Wilkins. I advised on 8/4 that he see ENT as did MRI at their request and he was to follow up with them. Thanks.

## 2014-11-22 NOTE — Telephone Encounter (Signed)
FYI Dr. Kim

## 2014-11-22 NOTE — Telephone Encounter (Signed)
Pt called to tell youi he is going to call [facility] , dr Haroldine Laws, for an appt to fup on his Sinuitis.  Pt has had the MRI

## 2014-12-22 ENCOUNTER — Encounter: Payer: Self-pay | Admitting: Family Medicine

## 2014-12-22 ENCOUNTER — Ambulatory Visit (INDEPENDENT_AMBULATORY_CARE_PROVIDER_SITE_OTHER): Payer: PRIVATE HEALTH INSURANCE | Admitting: Family Medicine

## 2014-12-22 VITALS — BP 122/82 | HR 77 | Temp 97.7°F | Ht 73.0 in | Wt 236.7 lb

## 2014-12-22 DIAGNOSIS — Z23 Encounter for immunization: Secondary | ICD-10-CM

## 2014-12-22 DIAGNOSIS — J329 Chronic sinusitis, unspecified: Secondary | ICD-10-CM | POA: Diagnosis not present

## 2014-12-22 DIAGNOSIS — Z0000 Encounter for general adult medical examination without abnormal findings: Secondary | ICD-10-CM | POA: Diagnosis not present

## 2014-12-22 DIAGNOSIS — E785 Hyperlipidemia, unspecified: Secondary | ICD-10-CM | POA: Diagnosis not present

## 2014-12-22 HISTORY — DX: Chronic sinusitis, unspecified: J32.9

## 2014-12-22 LAB — LIPID PANEL
CHOLESTEROL: 145 mg/dL (ref 0–200)
HDL: 36.1 mg/dL — ABNORMAL LOW (ref >39.00)
LDL CALC: 90 mg/dL (ref 0–99)
NonHDL: 108.64
Total CHOL/HDL Ratio: 4
Triglycerides: 95 mg/dL (ref 0.0–149.0)
VLDL: 19 mg/dL (ref 0.0–40.0)

## 2014-12-22 LAB — HEMOGLOBIN A1C: HEMOGLOBIN A1C: 6.1 % (ref 4.6–6.5)

## 2014-12-22 NOTE — Progress Notes (Signed)
HPI:  Here for CPE:  -Concerns and/or follow up today: none  -Diet: Poor  -Exercise: no regular exercise  -Diabetes and Dyslipidemia Screening: FASTING today  -Hx of HTN: no  -Vaccines: agreed to flu vaccine today  -sexual activity: yes, male partner, no new partners  -wants STI testing, Hep C screening (if born 33-1967): no, yes to one time hep c screen  -FH colon or prstate ca: see FH Last colon cancer screening: done Last prostate ca screening: done last year with PSA  -Alcohol, Tobacco, drug use: see social history  Review of Systems - no fevers, unintentional weight loss, vision loss, hearing loss, chest pain, sob, hemoptysis, melena, hematochezia, hematuria, genital discharge, changing or concerning skin lesions, bleeding, bruising, loc, thoughts of self harm or SI  Past Medical History  Diagnosis Date  . Osteoarthritis   . GERD (gastroesophageal reflux disease)     hx ulcer  . Allergy   . Plantar fasciitis   . ELECTROCARDIOGRAM, ABNORMAL 12/28/2008    Qualifier: Diagnosis of  By: Eden Emms, MD, Harrington Challenger   . Abdominal pain     right side radiating to left side  . Post-operative nausea and vomiting     1 time  . Sinusitis, chronic 12/22/2014    -hx sinus surgery, sees ENT   . H. pylori infection     Past Surgical History  Procedure Laterality Date  . Knee arthroscopy w/ meniscal repair      bilateral  . Wisdom tooth extraction      Family History  Problem Relation Age of Onset  . Stroke Mother   . Heart disease Mother     valvular  . Colon cancer Mother   . Stroke Father   . Diabetes Father   . Hypertension Father     Social History   Social History  . Marital Status: Single    Spouse Name: N/A  . Number of Children: N/A  . Years of Education: N/A   Social History Main Topics  . Smoking status: Never Smoker   . Smokeless tobacco: Never Used  . Alcohol Use: No     Comment: 1-2 drinks a few times per year  . Drug Use: No  .  Sexual Activity: No   Other Topics Concern  . 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  cefUROXime (CEFTIN) 250 MG tablet, TK 1 T PO  BID, Disp: , Rfl: 0 .  fluticasone (FLONASE) 50 MCG/ACT nasal spray, Place 2 sprays into both nostrils daily., Disp: 16 g, Rfl: 1  EXAM:  Filed Vitals:   12/22/14 1016  BP: 122/82  Pulse: 77  Temp: 97.7 F (36.5 C)  TempSrc: Oral  Height:  (1.854 m)  Weight: 236 lb 11.2 oz (107.366 kg)    Estimated body mass index is 31.24 kg/(m^2) as calculated from the following:   Height as of this encounter:  (1.854 m).   Weight as of this encounter: 236 lb 11.2 oz (107.366 kg).  GENERAL: vitals reviewed and listed below, alert, oriented, appears well hydrated and in no acute distress  HEENT: head atraumatic, PERRLA, normal appearance of eyes,  ears, nose and mouth. moist mucus membranes.  NECK: supple, no masses or lymphadenopathy  LUNGS: clear to auscultation bilaterally, no rales, rhonchi or wheeze  CV: HRRR, no peripheral edema or cyanosis, normal pedal pulses  ABDOMEN: bowel sounds normal, soft, non tender to palpation, no masses, no rebound or guarding  GU: declined  SKIN: no rash or abnormal lesions  MS: normal gait, moves all extremities normally  NEURO: CN II-XII grossly intact, normal muscle strength and sensation to light touch on extremities  PSYCH: normal affect, pleasant and cooperative  ASSESSMENT AND PLAN:  Discussed the following assessment and plan:  Visit for preventive health examination  -Discussed and advised all [place] preventive services health task force level A and B recommendations for age, sex and risks.  -Advised at least 150 minutes of exercise per week and a healthy diet low in saturated  fats and sweets and consisting of fresh fruits and vegetables, lean meats such as fish and white chicken and whole grains.  -FASTIN labs, studies and vaccines per orders this encounter   Patient advised to return to clinic immediately if symptoms worsen or persist or new concerns.  Patient Instructions  Before you leave: -flu shot -vaccines  -We have ordered labs or studies at this visit. It can take up to 1-2 weeks for results and processing. We will contact you with instructions IF your results are abnormal. Normal results will be released to your [facility]. If you have not heard from [place] or can not find your results in [facility] in 2 weeks please contact our office.  We recommend the following healthy lifestyle measures: - eat a healthy diet consisting of regular small portions of vegetables, fruits, beans, nuts, seeds, healthy meats such as white chicken and fish and whole grains.  - avoid sweets, white starchy foods, fried foods, fast food, processed foods, sodas, red meet and other fattening foods.  - get a least 150 minutes of aerobic exercise per week.       No Follow-up on file.   Kriste Basque R.

## 2014-12-22 NOTE — Patient Instructions (Signed)
Before you leave: -flu shot -vaccines  -We have ordered labs or studies at this visit. It can take up to 1-2 weeks for results and processing. We will contact you with instructions IF your results are abnormal. Normal results will be released to your [facility]. If you have not heard from [place] or can not find your results in [facility] in 2 weeks please contact our office.  We recommend the following healthy lifestyle measures: - eat a healthy diet consisting of regular small portions of vegetables, fruits, beans, nuts, seeds, healthy meats such as white chicken and fish and whole grains.  - avoid sweets, white starchy foods, fried foods, fast food, processed foods, sodas, red meet and other fattening foods.  - get a least 150 minutes of aerobic exercise per week.

## 2014-12-22 NOTE — Progress Notes (Signed)
Pre visit review using our clinic review tool, if applicable. No additional management support is needed unless otherwise documented below in the visit note. 

## 2015-01-24 ENCOUNTER — Encounter: Payer: Self-pay | Admitting: Pediatrics

## 2015-01-24 ENCOUNTER — Ambulatory Visit (INDEPENDENT_AMBULATORY_CARE_PROVIDER_SITE_OTHER): Payer: PRIVATE HEALTH INSURANCE | Admitting: Pediatrics

## 2015-01-24 VITALS — BP 130/75 | HR 80 | Temp 97.7°F | Resp 15 | Ht 72.0 in | Wt 236.0 lb

## 2015-01-24 DIAGNOSIS — J3089 Other allergic rhinitis: Secondary | ICD-10-CM | POA: Diagnosis not present

## 2015-01-24 DIAGNOSIS — J301 Allergic rhinitis due to pollen: Secondary | ICD-10-CM | POA: Diagnosis not present

## 2015-01-24 MED ORDER — FLUTICASONE PROPIONATE 50 MCG/ACT NA SUSP: 2.0000 | Freq: Every day | NASAL | Status: DC

## 2015-01-24 NOTE — Progress Notes (Signed)
[address] [place] [address] Dept: [phone]  New Patient Note  Patient ID: Marcus Wilkins, male    DOB: Jul 14, 1965  Age: 49 y.o. MRN: [id number] Date of Office Visit: 01/24/2015 Referring provider: Terressa Koyanagi, DO [address] [place], [place] [address]    Chief Complaint: Allergies  HPI Marcus Wilkins presents for an allergy evaluation. He is referred by Dr. Haroldine Laws. He may have chronic sinusitis. He has had nasal congestion for several years aggravated by exposure to dust ,weather changes and perfumes. About 2 months ago he had an MRI of his head which showed sinusitis. He was having headaches at the time.. In 2007 he had sinus surgery to correct a deviated nasal septum and to enlarge the ostiomeatal areas. He has 3 more days of doxycycline to take for sinus infection.  Review of Systems  Constitutional: Negative.   HENT: Positive for congestion.        Recent sinus infection with possible chronic sinusitis  Eyes: Negative.   Respiratory:       1 or 2 episodes of wheezing with colds only. No symptoms suggestive of asthma  Cardiovascular: Negative.   Skin: Negative.   Neurological:       Headache 2 months ago but not a chronic problem.  Endo/Heme/Allergies: Negative.   Psychiatric/Behavioral: Negative.     Outpatient Encounter Prescriptions as of 01/24/2015  Medication Sig  . doxycycline (VIBRAMYCIN) 100 MG capsule TK ONE C PO  BID  . fluticasone (FLONASE) 50 MCG/ACT nasal spray Place 2 sprays into both nostrils daily. (Patient not taking: Reported on 01/24/2015)  . [DISCONTINUED] cefUROXime (CEFTIN) 250 MG tablet TK 1 T PO  BID       Drug Allergies:  No Known Allergies  Family History: Christophe's family history includes Colon cancer in his mother; Diabetes in his father; Heart disease in his mother; Hypertension in his father; Stroke in his father and mother..  He works for the [employer] other employees. He does not have any  pets.  Physical Exam: BP 130/75 mmHg  Pulse 80  Temp(Src) 97.7 F (36.5 C)  Resp 15  Ht 6' (1.829 m)  Wt 236 lb (107.049 kg)  BMI 32.00 kg/m2   Physical Exam  Constitutional: He is oriented to person, place, and time. He appears well-developed and well-nourished.  HENT:  Eyes normal. Ears normal. Nose moderate swelling of the nasal turbinates with clear nasal discharge. Pharynx normal.  Neck: Neck supple. No thyromegaly present.  Cardiovascular:  S1 and S2 normal no murmurs  Pulmonary/Chest:  Clear to percussion and auscultation  Abdominal: Soft.  No tenderness or hepatosplenomegaly  Lymphadenopathy:    He has no cervical adenopathy.  Neurological: He is alert and oriented to person, place, and time.  Skin:  Clear  Psychiatric: He has a normal mood and affect. His behavior is normal. Thought content normal.    Diagnostics:  Allergy skin tests were extremely positive to grass pollen, weeds , tree pollens, dust mite, cat,dog and cockroach. There was less reactivity to some molds.  Assessment Assessment and Plan: 1. Allergic rhinitis due to pollen   2. Other allergic rhinitis     Meds ordered this encounter  Medications  . fluticasone (FLONASE) 50 MCG/ACT nasal spray    Sig: Place 2 sprays into both nostrils daily.    Dispense:  16 g    Refill:  5    Patient Instructions  Environmental control of dust mite and mold Fluticasone 2  sprays per nostril at night Cetirizine 10 mg once a day Because of the severity of his symptoms I added prednisone 20 mg twice a day for 3 days, 20 mg on day 4, 10 mg on th the fifth day Complete the course of doxycycline 100 mg once a day for the next 3 days He will call me he's not doing well on this treatment plan    Return in about 4 weeks (around 02/21/2015).   Thank you for the opportunity to care for this patient.  Please do not hesitate to contact me with questions.  Tonette BihariJ. A. Lorraine Terriquez, M.D.  [facility]  [place] [address] [place], [place] [address] [phone]

## 2015-01-24 NOTE — Patient Instructions (Addendum)
Environmental control of dust mite and mold Fluticasone 2 sprays per nostril at night Cetirizine 10 mg once a day Because of the severity of his symptoms I added prednisone 20 mg twice a day for 3 days, 20 mg on day 4, 10 mg on th the fifth day Complete the course of doxycycline 100 mg once a day for the next 3 days He will call me he's not doing well on this treatment plan

## 2015-02-21 ENCOUNTER — Ambulatory Visit (INDEPENDENT_AMBULATORY_CARE_PROVIDER_SITE_OTHER): Payer: PRIVATE HEALTH INSURANCE | Admitting: Pediatrics

## 2015-02-21 ENCOUNTER — Encounter: Payer: Self-pay | Admitting: Pediatrics

## 2015-02-21 VITALS — BP 128/70 | HR 84 | Resp 18

## 2015-02-21 DIAGNOSIS — J3089 Other allergic rhinitis: Secondary | ICD-10-CM

## 2015-02-21 DIAGNOSIS — J301 Allergic rhinitis due to pollen: Secondary | ICD-10-CM | POA: Diagnosis not present

## 2015-02-21 MED ORDER — EPINEPHRINE 0.3 MG/0.3ML IJ SOAJ: 0.3000 mg | Freq: Once | INTRAMUSCULAR | Status: DC

## 2015-02-21 NOTE — Progress Notes (Signed)
  [address] [place] [address] Dept: [phone]  FOLLOW UP NOTE  Patient ID: Marcus Wilkins Klima, male    DOB: [birth year]  Age: 49 y.o. MRN: [id number] Date of Office Visit: 02/21/2015  Assessment Chief Complaint: Sinus Problem and Headache  HPI Marcus Wilkins Howeth presents for follow-up of allergic rhinitis. He is not using fluticasone a daily basis. He is a very allergic individual. He has been having a stuffy nose for the last 2 days.  He has been using Zyrtec 10 mg once a day. His other medications are outlined in the chart   Drug Allergies:  No Known Allergies  Physical Exam: BP 128/70 mmHg  Pulse 84  Resp 18   Physical Exam  Constitutional: He is oriented to person, place, and time. He appears well-developed and well-nourished.  HENT:  Eyes normal. Ears normal. Nose normal. Pharynx normal.  Neck: Neck supple.  Cardiovascular:  S1 and S2 normal no murmurs  Pulmonary/Chest:  Clear to percussion and auscultation  Lymphadenopathy:    He has no cervical adenopathy.  Neurological: He is alert and oriented to person, place, and time.  Psychiatric: He has a normal mood and affect. His behavior is normal. Judgment and thought content normal.  Vitals reviewed.   Diagnostics:   none Assessment and Plan: 1. Allergic rhinitis due to pollen   2. Other allergic rhinitis     Meds ordered this encounter  No new medications                      Patient Instructions  Use fluticasone 2 sprays per nostril once a day on a daily basis If he has a cold he will do nasal saline irrigations at night followed by fluticasone 2 sprays per nostril once a day Cetirizine 10 mg once a day Allergy injections were discussed with him and he will let me know if he wants to go ahead with allergy injections.    Return if symptoms worsen or fail to improve.    Thank you for the opportunity to care for this patient.  Please do not hesitate to contact me with questions.  Tonette BihariJ. A.  Rosibel Giacobbe, M.D.  [facility] [place] [address] [place], [place] [address] [phone]

## 2015-02-21 NOTE — Patient Instructions (Signed)
Use fluticasone 2 sprays per nostril once a day on a daily basis If he has a cold he will do nasal saline irrigations at night followed by fluticasone 2 sprays per nostril once a day Cetirizine 10 mg once a day Allergy injections were discussed with him and he will let me know if he wants to go ahead with allergy injections.

## 2015-03-03 ENCOUNTER — Encounter: Payer: Self-pay | Admitting: Internal Medicine

## 2015-05-04 ENCOUNTER — Ambulatory Visit (INDEPENDENT_AMBULATORY_CARE_PROVIDER_SITE_OTHER): Payer: PRIVATE HEALTH INSURANCE | Admitting: Family Medicine

## 2015-05-04 ENCOUNTER — Encounter: Payer: Self-pay | Admitting: Family Medicine

## 2015-05-04 VITALS — BP 151/82 | HR 97 | Temp 97.8°F | Resp 16 | Ht 73.0 in | Wt 245.0 lb

## 2015-05-04 DIAGNOSIS — J0191 Acute recurrent sinusitis, unspecified: Secondary | ICD-10-CM | POA: Diagnosis not present

## 2015-05-04 DIAGNOSIS — R51 Headache: Secondary | ICD-10-CM

## 2015-05-04 DIAGNOSIS — IMO0001 Reserved for inherently not codable concepts without codable children: Secondary | ICD-10-CM

## 2015-05-04 DIAGNOSIS — M65312 Trigger thumb, left thumb: Secondary | ICD-10-CM

## 2015-05-04 DIAGNOSIS — R030 Elevated blood-pressure reading, without diagnosis of hypertension: Secondary | ICD-10-CM | POA: Diagnosis not present

## 2015-05-04 DIAGNOSIS — R519 Headache, unspecified: Secondary | ICD-10-CM

## 2015-05-04 MED ORDER — AMOXICILLIN-POT CLAVULANATE 875-125 MG PO TABS: 1.0000 | ORAL_TABLET | Freq: Two times a day (BID) | ORAL | Status: DC

## 2015-05-04 NOTE — Patient Instructions (Signed)
Saline nasal spray atleast 4 times per day, drink plenty of fluids.  Start augmentin for likely early sinus infection, but for chronic sinus issues - recommend flonase and follow up with your ear nose and throat specialist if needed.   Tylenol and over the counter brace for now if needed for thumb. See info below. If not improving in next few weeks, can refer you to hand specialist if needed.   Keep a record of your blood pressures outside of the office and if remaining over 140/90 - return here or your primary provider to discuss further.   Return to the clinic or go to the nearest emergency room if any of your symptoms worsen or new symptoms occur.  Trigger Finger Trigger finger (digital tendinitis and stenosing tenosynovitis) is a common disorder that causes an often painful catching of the fingers or thumb. It occurs as a clicking, snapping, or locking of a finger in the palm of the hand. This is caused by a problem with the tendons that flex or bend the fingers sliding smoothly through their sheaths. The condition may occur in any finger or a couple fingers at the same time.  The finger may lock with the finger curled or suddenly straighten out with a snap. This is more common in patients with rheumatoid arthritis and diabetes. Left untreated, the condition may get worse to the point where the finger becomes locked in flexion, like making a fist, or less commonly locked with the finger straightened out. CAUSES   Inflammation and scarring that lead to swelling around the tendon sheath.  Repeated or forceful movements.  Rheumatoid arthritis, an autoimmune disease that affects joints.  Gout.  Diabetes mellitus. SIGNS AND SYMPTOMS  Soreness and swelling of your finger.  A painful clicking or snapping as you bend and straighten your finger. DIAGNOSIS  Your health care provider will do a physical exam of your finger to diagnose trigger finger. TREATMENT   Splinting for 6-8 weeks may be  helpful.  Nonsteroidal anti-inflammatory medicines (NSAIDs) can help to relieve the pain and inflammation.  Cortisone injections, along with splinting, may speed up recovery. Several injections may be required. Cortisone may give relief after one injection.  Surgery is another treatment that may be used if conservative treatments do not work. Surgery can be minor, without incisions (a cut does not have to be made), and can be done with a needle through the skin.  Other surgical choices involve an open procedure in which the surgeon opens the hand through a small incision and cuts the pulley so the tendon can again slide smoothly. Your hand will still work fine. HOME CARE INSTRUCTIONS  Apply ice to the injured area, twice per day:  Put ice in a plastic bag.  Place a towel between your skin and the bag.  Leave the ice on for 20 minutes, 3-4 times a day.  Rest your hand often. MAKE SURE YOU:   Understand these instructions.  Will watch your condition.  Will get help right away if you are not doing well or get worse.   This information is not intended to replace advice given to you by your health care provider. Make sure you discuss any questions you have with your health care provider.   Document Released: 12/31/2003 Document Revised: 11/12/2012 Document Reviewed: 08/12/2012 Elsevier Interactive Patient Education 2016 [employer]    Sinusitis, Adult Sinusitis is redness, soreness, and inflammation of the paranasal sinuses. Paranasal sinuses are air pockets within the bones of  your face. They are located beneath your eyes, in the middle of your forehead, and above your eyes. In healthy paranasal sinuses, mucus is able to drain out, and air is able to circulate through them by way of your nose. However, when your paranasal sinuses are inflamed, mucus and air can become trapped. This can allow bacteria and other germs to grow and cause infection. Sinusitis can develop quickly and  last only a short time (acute) or continue over a long period (chronic). Sinusitis that lasts for more than 12 weeks is considered chronic. CAUSES Causes of sinusitis include:  Allergies.  Structural abnormalities, such as displacement of the cartilage that separates your nostrils (deviated septum), which can decrease the air flow through your nose and sinuses and affect sinus drainage.  Functional abnormalities, such as when the small hairs (cilia) that line your sinuses and help remove mucus do not work properly or are not present. SIGNS AND SYMPTOMS Symptoms of acute and chronic sinusitis are the same. The primary symptoms are pain and pressure around the affected sinuses. Other symptoms include:  Upper toothache.  Earache.  Headache.  Bad breath.  Decreased sense of smell and taste.  A cough, which worsens when you are lying flat.  Fatigue.  Fever.  Thick drainage from your nose, which often is green and may contain pus (purulent).  Swelling and warmth over the affected sinuses. DIAGNOSIS Your health care provider will perform a physical exam. During your exam, your health care provider may perform any of the following to help determine if you have acute sinusitis or chronic sinusitis:  Look in your nose for signs of abnormal growths in your nostrils (nasal polyps).  Tap over the affected sinus to check for signs of infection.  View the inside of your sinuses using an imaging device that has a light attached (endoscope). If your health care provider suspects that you have chronic sinusitis, one or more of the following tests may be recommended:  Allergy tests.  Nasal culture. A sample of mucus is taken from your nose, sent to a lab, and screened for bacteria.  Nasal cytology. A sample of mucus is taken from your nose and examined by your health care provider to determine if your sinusitis is related to an allergy. TREATMENT Most cases of acute sinusitis are related  to a viral infection and will resolve on their own within 10 days. Sometimes, medicines are prescribed to help relieve symptoms of both acute and chronic sinusitis. These may include pain medicines, decongestants, nasal steroid sprays, or saline sprays. However, for sinusitis related to a bacterial infection, your health care provider will prescribe antibiotic medicines. These are medicines that will help kill the bacteria causing the infection. Rarely, sinusitis is caused by a fungal infection. In these cases, your health care provider will prescribe antifungal medicine. For some cases of chronic sinusitis, surgery is needed. Generally, these are cases in which sinusitis recurs more than 3 times per year, despite other treatments. HOME CARE INSTRUCTIONS  Drink plenty of water. Water helps thin the mucus so your sinuses can drain more easily.  Use a humidifier.  Inhale steam 3-4 times a day (for example, sit in the bathroom with the shower running).  Apply a warm, moist washcloth to your face 3-4 times a day, or as directed by your health care provider.  Use saline nasal sprays to help moisten and clean your sinuses.  Take medicines only as directed by your health care provider.  If you were  prescribed either an antibiotic or antifungal medicine, finish it all even if you start to feel better. SEEK IMMEDIATE MEDICAL CARE IF:  You have increasing pain or severe headaches.  You have nausea, vomiting, or drowsiness.  You have swelling around your face.  You have vision problems.  You have a stiff neck.  You have difficulty breathing.   This information is not intended to replace advice given to you by your health care provider. Make sure you discuss any questions you have with your health care provider.   Document Released: 03/12/2005 Document Revised: 04/02/2014 Document Reviewed: 03/27/2011 Elsevier Interactive Patient Education [employer]

## 2015-05-04 NOTE — Progress Notes (Signed)
Subjective:    Patient ID: Marcus Wilkins, male    DOB: 1965/06/09, 50 y.o.   MRN: [id number] By signing my name below, I, Marcus Wilkins, attest that this documentation has been prepared under the direction and in the presence of Meredith Staggers, MD.  Electronically Signed: Littie Wilkins, Medical Scribe. 05/04/2015. 11:51 AM.  HPI HPI Comments: Marcus Wilkins is a 50 y.o. male with a history of GERD, allergic rhinitis, and chronic sinusitis who presents to the [facility] complaining of gradual onset sinus congestion that started about a week ago.   Sinus congestion: Patient reports having associated rhinorrhea with greenish/yellow discharge, headache, and sinus pressure. He believes he may have a sinus infection. He takes Flonase as needed, about once a week. He does not use any other nasal sprays or decongestants. Patient last saw ENT about 3-4 months ago and was not put on any daily medications. His PSHx includes sinus surgery (he had a deviated septum) a few years ago. Patient states he was placed on doxycycline for his last sinus infection. He has taken amoxicillin before.  Left thumb pain: Patient also complains of left thumb pain that started over a month ago. He notes there is a catch in the thumb when trying to bend it. There was a recent change in his type of work, requiring him to grasp with both thumbs. He has not tried anything for the pain. He is right hand dominant.  Elevated blood pressure: His blood pressure is 151/82 in the office today. Patient denies history of hypertension. He notes that he received a shot in his foot earlier today.  PCP: Dr. Selena Batten  Patient recently returned from a trip to [place] this past week.  Patient Active Problem List   Diagnosis Date Noted  . Allergic rhinitis due to pollen 01/24/2015  . Other allergic rhinitis 01/24/2015  . Sinusitis, chronic 12/22/2014  . Plantar fasciitis 07/28/2013  . Osteoarthritis of both knees 07/28/2013  .  GERD 12/28/2008   Past Medical History  Diagnosis Date  . Osteoarthritis   . GERD (gastroesophageal reflux disease)     hx ulcer  . Allergy   . Plantar fasciitis   . ELECTROCARDIOGRAM, ABNORMAL 12/28/2008    Qualifier: Diagnosis of  By: Eden Emms, MD, Harrington Challenger   . Abdominal pain     right side radiating to left side  . Post-operative nausea and vomiting     1 time  . Sinusitis, chronic 12/22/2014    -hx sinus surgery, sees ENT   . H. pylori infection    Past Surgical History  Procedure Laterality Date  . Knee arthroscopy w/ meniscal repair      bilateral  . Wisdom tooth extraction     No Known Allergies Prior to Admission medications   Medication Sig Start Date End Date Taking? Authorizing Provider  cetirizine (ZYRTEC) 10 MG tablet Take 10 mg by mouth daily.    Historical Provider, MD  doxycycline (VIBRAMYCIN) 100 MG capsule TK ONE C PO  BID 01/15/15   Historical Provider, MD  EPINEPHrine 0.3 mg/0.3 mL IJ SOAJ injection Inject 0.3 mLs (0.3 mg total) into the muscle once. 02/21/15   Fletcher Anon, MD  fenofibrate (TRICOR) 145 MG tablet Take 145 mg by mouth daily.    Historical Provider, MD  fluticasone (FLONASE) 50 MCG/ACT nasal spray Place 2 sprays into both nostrils daily. 08/19/14   Terressa Koyanagi, DO  fluticasone (FLONASE) 50 MCG/ACT nasal spray Place 2  sprays into both nostrils daily. 01/24/15   Fletcher Anon, MD  omeprazole (PRILOSEC) 20 MG capsule Take 20 mg by mouth daily.    Historical Provider, MD   Social History   Social History  . Marital Status: Single    Spouse Name: N/A  . Number of Children: N/A  . Years of Education: N/A   Occupational History  . Not on file.   Social History Main Topics  . Smoking status: Never Smoker   . Smokeless tobacco: Never Used  . Alcohol Use: No     Comment: 1-2 drinks a few times per year  . Drug Use: No  . Sexual Activity: No   Other Topics Concern  . Not on file   Social History Narrative   Work or School:  post Producer, television/film/video      Home Situation: lives with parents      Spiritual Beliefs: Christian      Lifestyle: no CV exercise; diet is good                    Review of Systems  Constitutional: Negative for fatigue and unexpected weight change.  HENT: Positive for congestion, rhinorrhea and sinus pressure.   Eyes: Negative for visual disturbance.  Respiratory: Negative for cough, chest tightness and shortness of breath.   Cardiovascular: Negative for chest pain, palpitations and leg swelling.  Gastrointestinal: Negative for abdominal pain and blood in stool.  Neurological: Positive for headaches. Negative for dizziness and light-headedness.       Objective:   Physical Exam  Constitutional: He is oriented to person, place, and time. He appears well-developed and well-nourished.  HENT:  Head: Normocephalic and atraumatic.  Right Ear: Tympanic membrane, external ear and ear canal normal.  Left Ear: Tympanic membrane, external ear and ear canal normal.  Nose: No rhinorrhea. Right sinus exhibits no maxillary sinus tenderness and no frontal sinus tenderness. Left sinus exhibits no maxillary sinus tenderness and no frontal sinus tenderness.  Mouth/Throat: Oropharynx is clear and moist and mucous membranes are normal. No oropharyngeal exudate or posterior oropharyngeal erythema.  Slight crusting, yellow discharge, and dried blood on bilateral septums. No active discharge. No active bleeding.  Eyes: Conjunctivae and EOM are normal. Pupils are equal, round, and reactive to light.  Neck: Neck supple. No JVD present. Carotid bruit is not present.  Cardiovascular: Normal rate, regular rhythm, normal heart sounds and intact distal pulses.   No murmur heard. Pulmonary/Chest: Effort normal and breath sounds normal. He has no wheezes. He has no rhonchi. He has no rales.  Abdominal: Soft. There is no tenderness.  Musculoskeletal: He exhibits tenderness. He exhibits no edema.  Full ROM left  wrist. No scaphoid tenderness. Wrist non-tender. Negative Finkelstein. Slight triggering at the left base of the thumb. Minimal tenderness over the volar first MCP.  Lymphadenopathy:    He has no cervical adenopathy.  Neurological: He is alert and oriented to person, place, and time.  Skin: Skin is warm and dry. No rash noted.  Psychiatric: He has a normal mood and affect. His behavior is normal.  Vitals reviewed.   Filed Vitals:   05/04/15 1117 05/04/15 1121  BP: 150/98 151/82  Pulse: 97   Temp: 97.8 F (36.6 C)   TempSrc: Oral   Resp: 16   Height: 6\' 1"  (1.854 m)   Weight: 245 lb (111.131 kg)          Assessment & Plan:   BENJAMIM HARNISH is a  50 y.o. male Acute recurrent sinusitis, unspecified location - Plan: amoxicillin-clavulanate (AUGMENTIN) 875-125 MG tablet  -Likely chronic sinusitis component with allergies. Now with secondary acute sinusitis, and may be either ethmoid or sphenoid based on location of headache and unable to reproduce pain on exam.   -Start with Augmentin twice a day, side effects discussed, RTC precautions discussed.  -For chronic sinusitis, Flonase nasal spray, other allergen treatment, and recommended follow-up with ENT if symptoms persist.  Trigger thumb of left hand  -Early symptoms, likely due to change in type of activity at work. Can try over-the-counter brace, change in activity to minimize repetitive grasping using that left thumb, and if persistently over the next few weeks, can refer to hand specialist for possible injection  Headache, unspecified headache type  -May be combination of elevated blood pressure and sinusitis. We'll treat sinusitis, monitor blood pressures outside of office, avoid NSAIDs, and follow-up with [place] or PCP if symptoms persist. OTC/ER precautions discussed  Elevated blood pressure  -As above, monitor pressure out of office. Follow-up here or PCP if persistently elevated over 140/90.  Meds ordered this encounter    Medications  . amoxicillin-clavulanate (AUGMENTIN) 875-125 MG tablet    Sig: Take 1 tablet by mouth 2 (two) times daily.    Dispense:  20 tablet    Refill:  0   Patient Instructions  Saline nasal spray atleast 4 times per day, drink plenty of fluids.  Start augmentin for likely early sinus infection, but for chronic sinus issues - recommend flonase and follow up with your ear nose and throat specialist if needed.   Tylenol and over the counter brace for now if needed for thumb. See info below. If not improving in next few weeks, can refer you to hand specialist if needed.   Keep a record of your blood pressures outside of the office and if remaining over 140/90 - return here or your primary provider to discuss further.   Return to the clinic or go to the nearest emergency room if any of your symptoms worsen or new symptoms occur.  Trigger Finger Trigger finger (digital tendinitis and stenosing tenosynovitis) is a common disorder that causes an often painful catching of the fingers or thumb. It occurs as a clicking, snapping, or locking of a finger in the palm of the hand. This is caused by a problem with the tendons that flex or bend the fingers sliding smoothly through their sheaths. The condition may occur in any finger or a couple fingers at the same time.  The finger may lock with the finger curled or suddenly straighten out with a snap. This is more common in patients with rheumatoid arthritis and diabetes. Left untreated, the condition may get worse to the point where the finger becomes locked in flexion, like making a fist, or less commonly locked with the finger straightened out. CAUSES   Inflammation and scarring that lead to swelling around the tendon sheath.  Repeated or forceful movements.  Rheumatoid arthritis, an autoimmune disease that affects joints.  Gout.  Diabetes mellitus. SIGNS AND SYMPTOMS  Soreness and swelling of your finger.  A painful clicking or snapping  as you bend and straighten your finger. DIAGNOSIS  Your health care provider will do a physical exam of your finger to diagnose trigger finger. TREATMENT   Splinting for 6-8 weeks may be helpful.  Nonsteroidal anti-inflammatory medicines (NSAIDs) can help to relieve the pain and inflammation.  Cortisone injections, along with splinting, may speed up recovery. Several  injections may be required. Cortisone may give relief after one injection.  Surgery is another treatment that may be used if conservative treatments do not work. Surgery can be minor, without incisions (a cut does not have to be made), and can be done with a needle through the skin.  Other surgical choices involve an open procedure in which the surgeon opens the hand through a small incision and cuts the pulley so the tendon can again slide smoothly. Your hand will still work fine. HOME CARE INSTRUCTIONS  Apply ice to the injured area, twice per day:  Put ice in a plastic bag.  Place a towel between your skin and the bag.  Leave the ice on for 20 minutes, 3-4 times a day.  Rest your hand often. MAKE SURE YOU:   Understand these instructions.  Will watch your condition.  Will get help right away if you are not doing well or get worse.   This information is not intended to replace advice given to you by your health care provider. Make sure you discuss any questions you have with your health care provider.   Document Released: 12/31/2003 Document Revised: 11/12/2012 Document Reviewed: 08/12/2012 Elsevier Interactive Patient Education 2016 Elsevier Inc.    Sinusitis, Adult Sinusitis is redness, soreness, and inflammation of the paranasal sinuses. Paranasal sinuses are air pockets within the bones of your face. They are located beneath your eyes, in the middle of your forehead, and above your eyes. In healthy paranasal sinuses, mucus is able to drain out, and air is able to circulate through them by way of your nose.  However, when your paranasal sinuses are inflamed, mucus and air can become trapped. This can allow bacteria and other germs to grow and cause infection. Sinusitis can develop quickly and last only a short time (acute) or continue over a long period (chronic). Sinusitis that lasts for more than 12 weeks is considered chronic. CAUSES Causes of sinusitis include:  Allergies.  Structural abnormalities, such as displacement of the cartilage that separates your nostrils (deviated septum), which can decrease the air flow through your nose and sinuses and affect sinus drainage.  Functional abnormalities, such as when the small hairs (cilia) that line your sinuses and help remove mucus do not work properly or are not present. SIGNS AND SYMPTOMS Symptoms of acute and chronic sinusitis are the same. The primary symptoms are pain and pressure around the affected sinuses. Other symptoms include:  Upper toothache.  Earache.  Headache.  Bad breath.  Decreased sense of smell and taste.  A cough, which worsens when you are lying flat.  Fatigue.  Fever.  Thick drainage from your nose, which often is green and may contain pus (purulent).  Swelling and warmth over the affected sinuses. DIAGNOSIS Your health care provider will perform a physical exam. During your exam, your health care provider may perform any of the following to help determine if you have acute sinusitis or chronic sinusitis:  Look in your nose for signs of abnormal growths in your nostrils (nasal polyps).  Tap over the affected sinus to check for signs of infection.  View the inside of your sinuses using an imaging device that has a light attached (endoscope). If your health care provider suspects that you have chronic sinusitis, one or more of the following tests may be recommended:  Allergy tests.  Nasal culture. A sample of mucus is taken from your nose, sent to a lab, and screened for bacteria.  Nasal cytology. A  sample of mucus is taken from your nose and examined by your health care provider to determine if your sinusitis is related to an allergy. TREATMENT Most cases of acute sinusitis are related to a viral infection and will resolve on their own within 10 days. Sometimes, medicines are prescribed to help relieve symptoms of both acute and chronic sinusitis. These may include pain medicines, decongestants, nasal steroid sprays, or saline sprays. However, for sinusitis related to a bacterial infection, your health care provider will prescribe antibiotic medicines. These are medicines that will help kill the bacteria causing the infection. Rarely, sinusitis is caused by a fungal infection. In these cases, your health care provider will prescribe antifungal medicine. For some cases of chronic sinusitis, surgery is needed. Generally, these are cases in which sinusitis recurs more than 3 times per year, despite other treatments. HOME CARE INSTRUCTIONS  Drink plenty of water. Water helps thin the mucus so your sinuses can drain more easily.  Use a humidifier.  Inhale steam 3-4 times a day (for example, sit in the bathroom with the shower running).  Apply a warm, moist washcloth to your face 3-4 times a day, or as directed by your health care provider.  Use saline nasal sprays to help moisten and clean your sinuses.  Take medicines only as directed by your health care provider.  If you were prescribed either an antibiotic or antifungal medicine, finish it all even if you start to feel better. SEEK IMMEDIATE MEDICAL CARE IF:  You have increasing pain or severe headaches.  You have nausea, vomiting, or drowsiness.  You have swelling around your face.  You have vision problems.  You have a stiff neck.  You have difficulty breathing.   This information is not intended to replace advice given to you by your health care provider. Make sure you discuss any questions you have with your health care  provider.   Document Released: 03/12/2005 Document Revised: 04/02/2014 Document Reviewed: 03/27/2011 Elsevier Interactive Patient Education [employer]     I personally performed the services described in this documentation, which was scribed in my presence. The recorded information has been reviewed and considered, and addended by me as needed.

## 2015-09-29 ENCOUNTER — Telehealth: Payer: Self-pay | Admitting: Podiatry

## 2015-09-29 NOTE — Telephone Encounter (Signed)
lvm for pt to call back to schedule an appt °

## 2015-10-19 ENCOUNTER — Encounter: Payer: Self-pay | Admitting: Podiatry

## 2015-10-19 ENCOUNTER — Ambulatory Visit (INDEPENDENT_AMBULATORY_CARE_PROVIDER_SITE_OTHER): Payer: PRIVATE HEALTH INSURANCE | Admitting: Podiatry

## 2015-10-19 ENCOUNTER — Ambulatory Visit (INDEPENDENT_AMBULATORY_CARE_PROVIDER_SITE_OTHER): Payer: PRIVATE HEALTH INSURANCE

## 2015-10-19 VITALS — BP 159/99 | HR 90 | Resp 16 | Ht 74.0 in | Wt 245.0 lb

## 2015-10-19 DIAGNOSIS — M79671 Pain in right foot: Secondary | ICD-10-CM | POA: Diagnosis not present

## 2015-10-19 DIAGNOSIS — M2041 Other hammer toe(s) (acquired), right foot: Secondary | ICD-10-CM | POA: Diagnosis not present

## 2015-10-19 DIAGNOSIS — M216X9 Other acquired deformities of unspecified foot: Secondary | ICD-10-CM

## 2015-10-19 NOTE — Patient Instructions (Signed)
Pre-Operative Instructions  Congratulations, you have decided to take an important step to improving your quality of life.  You can be assured that the doctors of [facility] will be with you every step of the way.  1. Plan to be at the surgery center/hospital at least 1 (one) hour prior to your scheduled time unless otherwise directed by the surgical center/hospital staff.  You must have a responsible adult accompany you, remain during the surgery and drive you home.  Make sure you have directions to the surgical center/hospital and know how to get there on time. 2. For hospital based surgery you will need to obtain a history and physical form from your family physician within 1 month prior to the date of surgery- we will give you a form for you primary physician.  3. We make every effort to accommodate the date you request for surgery.  There are however, times where surgery dates or times have to be moved.  We will contact you as soon as possible if a change in schedule is required.   4. No Aspirin/Ibuprofen for one week before surgery.  If you are on aspirin, any non-steroidal anti-inflammatory medications (Mobic, Aleve, Ibuprofen) you should stop taking it 7 days prior to your surgery.  You make take Tylenol  For pain prior to surgery.  5. Medications- If you are taking daily heart and blood pressure medications, seizure, reflux, allergy, asthma, anxiety, pain or diabetes medications, make sure the surgery center/hospital is aware before the day of surgery so they may notify you which medications to take or avoid the day of surgery. 6. No food or drink after midnight the night before surgery unless directed otherwise by surgical center/hospital staff. 7. No alcoholic beverages 24 hours prior to surgery.  No smoking 24 hours prior to or 24 hours after surgery. 8. Wear loose pants or shorts- loose enough to fit over bandages, boots, and casts. 9. No slip on shoes, sneakers are best. 10. Bring  your boot with you to the surgery center/hospital.  Also bring crutches or a walker if your physician has prescribed it for you.  If you do not have this equipment, it will be provided for you after surgery. 11. If you have not been contracted by the surgery center/hospital by the day before your surgery, call to confirm the date and time of your surgery. 12. Leave-time from work may vary depending on the type of surgery you have.  Appropriate arrangements should be made prior to surgery with your employer. 13. Prescriptions will be provided immediately following surgery by your doctor.  Have these filled as soon as possible after surgery and take the medication as directed. 14. Remove nail polish on the operative foot. 15. Wash the night before surgery.  The night before surgery wash the foot and leg well with the antibacterial soap provided and water paying special attention to beneath the toenails and in between the toes.  Rinse thoroughly with water and dry well with a towel.  Perform this wash unless told not to do so by your physician.  Enclosed: 1 Ice pack (please put in freezer the night before surgery)   1 Hibiclens skin cleaner   Pre-op Instructions  If you have any questions regarding the instructions, do not hesitate to call our office.  [place]: [address] [place], [place] [address] [phone]  [place]: [address] [place], [place] [address] [phone]  [place]: [address]  [place], [place] [address] [phone]   Dr.   Norman Regal DPM, Dr. Matthew Wagoner DPM, Dr. M. Todd Hyatt DPM, Dr. Titorya Stover DPM 

## 2015-10-19 NOTE — Progress Notes (Signed)
Subjective:     Patient ID: Marcus Wilkins, male   DOB: 1965-11-04, 50 y.o.   MRN: [id number]  HPI patient presents on referral with a significant elevation of the second digit right that he states it's gotten worse over the last few months and significant discomfort in the second metatarsophalangeal joint with fluid buildup within the joint surface. Patient states that this is becoming more of an issue over the last few months and he cannot walk comfortably and he's tried numerous cortisone injections paddings and other modalities and he wants it fixed   Review of Systems  All other systems reviewed and are negative.      Objective:   Physical Exam  Constitutional: He is oriented to person, place, and time.  Cardiovascular: Intact distal pulses.   Musculoskeletal: Normal range of motion.  Neurological: He is oriented to person, place, and time.  Skin: Skin is warm.  Nursing note and vitals reviewed.  neurovascular status intact muscle strength adequate range of motion within normal limits with patient found to have inflammation and pain around the second MPJ right with significant elevation of the digit and fluid buildup within the joint surface. Patient's found have good digital perfusion is well oriented 3 and has no equinus condition     Assessment:     Inflammatory capsulitis second MPJ right with rigid contracture of the toe and pain dorsally on the toe with probability for flexor plate stretch or tear    Plan:     H&P and x-rays reviewed. Spent a great of time educating him on deformity and nature of the digital contracture. Patient wants surgery and was sent over by another physician for this and wants to do the consent form today so he does not have another co-pay. At this point I allowed him to read going over all possible alternative treatments and complications. Patient understands this and is willing to accept risk for digital fusion shortening osteotomy and signs consent form  after extensive review and he does understand that recovery can take approximately one year. I did give him a shorter boot to try to take stress off his foot and advised on padding until we can do the surgery which hopefully be in the next few weeks. Patient is encouraged to call with any questions prior to procedure  X-ray report indicate significant elevation of the second digit with rigid contracture of the second metatarsophalangeal joint

## 2015-10-19 NOTE — Progress Notes (Signed)
   Subjective:    Patient ID: Marcus Wilkins, male    DOB: Sep 02, 1965, 50 y.o.   MRN: [id number]  HPI Chief Complaint  Patient presents with  . Toe Pain    Right foot, 2nd toe; pt stated, "Saw Dr. Elijah Birk 05/04/15 and was told had a pinched nerve and a fracture on second toe; was put into a boot, which helped; has had a series of three cortisone shots"      Review of Systems  All other systems reviewed and are negative.      Objective:   Physical Exam        Assessment & Plan:

## 2015-12-08 ENCOUNTER — Telehealth: Payer: Self-pay | Admitting: *Deleted

## 2015-12-08 NOTE — Telephone Encounter (Signed)
"  I'm returning your call."  Yes, Dr. Charlsie Merlesegal is going to be out the week of October 17.  We are going to need to reschedule your surgery Dr. Charlsie Merlesegal is going to be out that day.  "Okay, let's schedule it for the following week."  I'll get it scheduled for October 24.  Thank you so much.

## 2015-12-08 NOTE — Telephone Encounter (Signed)
I left patient a message to call me back.  I need to reschedule his surgery.

## 2016-01-17 ENCOUNTER — Encounter: Payer: Self-pay | Admitting: Podiatry

## 2016-01-17 ENCOUNTER — Telehealth: Payer: Self-pay | Admitting: *Deleted

## 2016-01-17 DIAGNOSIS — M21541 Acquired clubfoot, right foot: Secondary | ICD-10-CM | POA: Diagnosis not present

## 2016-01-17 DIAGNOSIS — M2041 Other hammer toe(s) (acquired), right foot: Secondary | ICD-10-CM | POA: Diagnosis not present

## 2016-01-17 NOTE — Telephone Encounter (Signed)
"  My husband is scheduled for surgery today.  We are lost.  Can you help us get there?"  Where are you?  "We are at the corner of [address] and [address]  You need to keep straight, cross over [address]  "Is the address [address]?"  Yes, that is the address.  "Okay, thank you."  I called and informed Aram BeechamCynthia at the surgical center that patient was lost.  I gave him instructions and he is on the way.

## 2016-01-20 NOTE — Progress Notes (Signed)
DOS 10.24.2017 Fusion with Pin 2nd Toe Right; Shortening Osteotomy with Screw 2nd Right

## 2016-01-25 ENCOUNTER — Ambulatory Visit (INDEPENDENT_AMBULATORY_CARE_PROVIDER_SITE_OTHER): Payer: PRIVATE HEALTH INSURANCE

## 2016-01-25 ENCOUNTER — Encounter: Payer: Self-pay | Admitting: Podiatry

## 2016-01-25 ENCOUNTER — Ambulatory Visit (INDEPENDENT_AMBULATORY_CARE_PROVIDER_SITE_OTHER): Payer: PRIVATE HEALTH INSURANCE | Admitting: Podiatry

## 2016-01-25 VITALS — BP 138/94 | HR 89 | Temp 98.0°F | Resp 16

## 2016-01-25 DIAGNOSIS — M79673 Pain in unspecified foot: Secondary | ICD-10-CM | POA: Diagnosis not present

## 2016-01-25 DIAGNOSIS — Z9889 Other specified postprocedural states: Secondary | ICD-10-CM

## 2016-01-25 MED ORDER — TRAMADOL HCL 50 MG PO TABS: 50.0000 mg | ORAL_TABLET | Freq: Three times a day (TID) | ORAL | 0 refills | Status: DC | PRN

## 2016-01-29 NOTE — Progress Notes (Signed)
Subjective: Patient presents one week status post surgery with Dr. Charlsie Merlesregal. Patient states that he is doing well. Patient experiences pain 6-7/10. Patient states that he is tolerating the pain.  Objective: Incision site is well coapted. Sutures are intact. No sign of infection.  Assessment: Status post right foot surgery. Date of surgery October 2014 1117  Plan of care: Patient is doing well. Today prescription for tramadol was dispensed. Patient is to return to clinic in 1 week. X-rays were reviewed.

## 2016-02-08 ENCOUNTER — Ambulatory Visit (INDEPENDENT_AMBULATORY_CARE_PROVIDER_SITE_OTHER): Payer: PRIVATE HEALTH INSURANCE

## 2016-02-08 ENCOUNTER — Ambulatory Visit (INDEPENDENT_AMBULATORY_CARE_PROVIDER_SITE_OTHER): Payer: PRIVATE HEALTH INSURANCE | Admitting: Podiatry

## 2016-02-08 DIAGNOSIS — M216X9 Other acquired deformities of unspecified foot: Secondary | ICD-10-CM

## 2016-02-08 DIAGNOSIS — Z9889 Other specified postprocedural states: Secondary | ICD-10-CM

## 2016-02-09 NOTE — Progress Notes (Signed)
Subjective:     Patient ID: Marcus OfferJeff C Wilkins, male   DOB: [birth year], 50 y.o.   MRN: [id number]  HPI patient states she's doing well with his right foot with digit in good alignment but he did traumatize it and is worried he may have damage the patient   Review of Systems     Objective:   Physical Exam Neurovascular status intact negative Homans sign noted with well healed surgical site right second metatarsal second digit with good alignment pin in place and no sign of movement    Assessment:     Doing well post osteotomy second metatarsal right digital fusion right    Plan:     Reviewed x-rays and explained removed stitches with wound edges well coapted with no drainage or redness noted. I applied dressing to lower the second toe and instructed on compression continued elevation and immobilization and reappoint 2 weeks for pin removal or earlier if any issues should occur  X-ray report indicates the pin is in place with no signs of movement screws in place with good alignment

## 2016-02-22 ENCOUNTER — Ambulatory Visit (INDEPENDENT_AMBULATORY_CARE_PROVIDER_SITE_OTHER): Payer: PRIVATE HEALTH INSURANCE | Admitting: Podiatry

## 2016-02-22 ENCOUNTER — Ambulatory Visit (INDEPENDENT_AMBULATORY_CARE_PROVIDER_SITE_OTHER): Payer: PRIVATE HEALTH INSURANCE

## 2016-02-22 DIAGNOSIS — M216X9 Other acquired deformities of unspecified foot: Secondary | ICD-10-CM | POA: Diagnosis not present

## 2016-02-22 DIAGNOSIS — Z9889 Other specified postprocedural states: Secondary | ICD-10-CM

## 2016-02-22 NOTE — Progress Notes (Signed)
Subjective:     Patient ID: Marcus Wilkins, male   DOB: [birth year], 50 y.o.   MRN: [id number]  HPI patient states she's doing real well with his right foot with diminished discomfort good structure and pins in place   Review of Systems     Objective:   Physical Exam Neurovascular status intact negative Homans sign noted with digits and good alignment right with wound edges well coapted and no drainage or other pathology noted with no other structural issues    Assessment:     Doing well post foot surgery right with pins in place and good alignment    Plan:     Reviewed condition and x-rays and at this point I did remove the pins from the second and third toe right applied sterile dressings the end of the toe gave instructions on padding and lowering of the toes dispensed anklet and begin gradual soft shoe usage in the next week  X-ray report indicates that the osteotomy and fixation of the digits looks good with good alignment noted

## 2016-03-01 ENCOUNTER — Encounter: Payer: Self-pay | Admitting: Podiatry

## 2016-03-01 ENCOUNTER — Ambulatory Visit (INDEPENDENT_AMBULATORY_CARE_PROVIDER_SITE_OTHER): Payer: PRIVATE HEALTH INSURANCE | Admitting: Podiatry

## 2016-03-01 DIAGNOSIS — L03031 Cellulitis of right toe: Secondary | ICD-10-CM

## 2016-03-01 DIAGNOSIS — L02611 Cutaneous abscess of right foot: Secondary | ICD-10-CM

## 2016-03-01 MED ORDER — CEPHALEXIN 500 MG PO CAPS: 500.0000 mg | ORAL_CAPSULE | Freq: Two times a day (BID) | ORAL | 1 refills | Status: DC

## 2016-03-04 NOTE — Progress Notes (Signed)
Subjective:     Patient ID: Marcus Wilkins, male   DOB: Nov 25, 1965, 50 y.o.   MRN: [id number]  HPI patient states I'm not having pain but I just wanted to get this area checked as I had some drainage after the stitches removed at last visit but lately it's just been crust   Review of Systems     Objective:   Physical Exam Neurovascular status intact muscle strength adequate with a small approximate 10 mm length area of crusted tissue on the distal portion of the incision site with several millimeters of width. It is localized in nature there is no drainage noted currently and no proximal edema erythema or drainage noted at this time    Assessment:     Localized dehiscence process right with no indications of proximal spread or indications of cellulitic event    Plan:     Advised on soaks and at this crush should come off with healthy tissue underneath. As precautionary measure placed on cephalexin 500 mg twice a day and I gave strict instructions if any drainage were to occur or any redness or any systemic signs of infection which were explained that the patient is to contact us immediately  X-ray report indicates good healing of the osteotomy sites screw in place digits and good alignment with no ostial lysis noted

## 2016-03-20 ENCOUNTER — Ambulatory Visit (INDEPENDENT_AMBULATORY_CARE_PROVIDER_SITE_OTHER): Payer: PRIVATE HEALTH INSURANCE | Admitting: Adult Health

## 2016-03-20 ENCOUNTER — Encounter: Payer: Self-pay | Admitting: Adult Health

## 2016-03-20 VITALS — BP 136/90 | Temp 97.6°F | Ht 74.0 in | Wt 253.1 lb

## 2016-03-20 DIAGNOSIS — I10 Essential (primary) hypertension: Secondary | ICD-10-CM

## 2016-03-20 NOTE — Progress Notes (Signed)
Pre visit review using our clinic review tool, if applicable. No additional management support is needed unless otherwise documented below in the visit note. 

## 2016-03-20 NOTE — Progress Notes (Signed)
Subjective:    Patient ID: Marcus Wilkins, male    DOB: 1965/11/09, 50 y.o.   MRN: [id number]  HPI   50 year old male who presents to the office today for one month of blurred vision and headaches. He reports that he feels as though his blood pressure has been elevated. He has started monitoring his blood pressures at home and have been in the 150's systolic over 90-100's.   He reports that he has not been exercising and is not eating healthy.   He reports that his last vision exam was a year ago and " it was normal".   His headaches are intermittent and appear when his blood pressure is elevated   BP Readings from Last 3 Encounters:  03/20/16 136/90  01/25/16 (!) 138/94  10/19/15 (!) 159/99     Review of Systems  Constitutional: Negative.   HENT: Negative.   Eyes: Positive for visual disturbance.  Respiratory: Negative.   Cardiovascular: Negative.   Neurological: Positive for headaches. Negative for dizziness, speech difficulty and numbness.  All other systems reviewed and are negative.  Past Medical History:  Diagnosis Date  . Abdominal pain    right side radiating to left side  . Allergy   . ELECTROCARDIOGRAM, ABNORMAL 12/28/2008   Qualifier: Diagnosis of  By: Eden EmmsNishan, MD, Harrington ChallengerFACC, Peter Charles   . GERD (gastroesophageal reflux disease)    hx ulcer  . H. pylori infection   . Osteoarthritis   . Plantar fasciitis   . Post-operative nausea and vomiting    1 time  . Sinusitis, chronic 12/22/2014   -hx sinus surgery, sees ENT     Social History   Social History  . Marital status: Single    Spouse name: N/A  . Number of children: N/A  . Years of education: N/A   Occupational History  . Not on file.   Social History Main Topics  . Smoking status: Never Smoker  . Smokeless tobacco: Never Used  . Alcohol use No     Comment: 1-2 drinks a few times per year  . Drug use: No  . Sexual activity: No   Other Topics Concern  . Not on file   Social History  Narrative   Work or School: post Producer, television/film/videooffice supervisor      Home Situation: lives with parents      Spiritual Beliefs: Christian      Lifestyle: no CV exercise; diet is good                   Past Surgical History:  Procedure Laterality Date  . KNEE ARTHROSCOPY W/ MENISCAL REPAIR     bilateral  . WISDOM TOOTH EXTRACTION      Family History  Problem Relation Age of Onset  . Stroke Mother   . Heart disease Mother     valvular  . Colon cancer Mother   . Stroke Father   . Diabetes Father   . Hypertension Father     No Known Allergies  Current Outpatient Prescriptions on File Prior to Visit  Medication Sig Dispense Refill  . cetirizine (ZYRTEC) 10 MG tablet Take 10 mg by mouth daily.    . fenofibrate (TRICOR) 145 MG tablet Take 145 mg by mouth daily. Reported on 05/04/2015    . fluticasone (FLONASE) 50 MCG/ACT nasal spray Place 2 sprays into both nostrils daily. 16 g 5  . omeprazole (PRILOSEC) 20 MG capsule Take 20 mg by mouth daily. Reported on 05/04/2015    .  traMADol (ULTRAM) 50 MG tablet Take 1 tablet (50 mg total) by mouth every 8 (eight) hours as needed. 60 tablet 0   No current facility-administered medications on file prior to visit.     BP 136/90   Temp 97.6 F (36.4 C) (Oral)   Ht 6\' 2"  (1.88 m)   Wt 253 lb 2 oz (114.8 kg)   BMI 32.50 kg/m       Objective:   Physical Exam  Constitutional: He is oriented to person, place, and time. He appears well-developed and well-nourished. No distress.  Eyes: Conjunctivae and EOM are normal. Pupils are equal, round, and reactive to light. Right eye exhibits no discharge. Left eye exhibits no discharge. No scleral icterus.  Cardiovascular: Normal rate, regular rhythm, normal heart sounds and intact distal pulses.  Exam reveals no gallop and no friction rub.   No murmur heard. Pulmonary/Chest: Effort normal and breath sounds normal. No respiratory distress. He has no wheezes. He has no rales. He exhibits no tenderness.    Neurological: He is alert and oriented to person, place, and time.  Skin: Skin is warm and dry. No rash noted. He is not diaphoretic. No erythema. No pallor.  Psychiatric: He has a normal mood and affect. His behavior is normal. Judgment and thought content normal.  Nursing note and vitals reviewed.     Assessment & Plan:  1. Essential hypertension - We discussed starting medication for hypertension. He would like an opportunity to work on diet and exercise before starting medication. He is going to follow up with Dr. Selena BattenKim for his CPE and to continue monitoring his blood pressure.  - Follow up as needed  Shirline Freesory Nefi Musich, NP

## 2016-03-21 ENCOUNTER — Ambulatory Visit (INDEPENDENT_AMBULATORY_CARE_PROVIDER_SITE_OTHER): Payer: Self-pay | Admitting: Podiatry

## 2016-03-21 ENCOUNTER — Ambulatory Visit (INDEPENDENT_AMBULATORY_CARE_PROVIDER_SITE_OTHER): Payer: PRIVATE HEALTH INSURANCE

## 2016-03-21 ENCOUNTER — Encounter: Payer: Self-pay | Admitting: Podiatry

## 2016-03-21 DIAGNOSIS — M216X9 Other acquired deformities of unspecified foot: Secondary | ICD-10-CM

## 2016-03-21 NOTE — Progress Notes (Signed)
Subjective:     Patient ID: Marcus Wilkins, male   DOB: 09-14-1965, 50 y.o.   MRN: [id number]  HPI patient presents stating that he's doing well with no active drainage noted and crusted tissue on the second digit right with good alignment and positional component   Review of Systems     Objective:   Physical Exam Neurovascular status intact muscle strength adequate with good alignment of the digit second right with mild dorsal edema and no drainage with a slight crusted tissue which is healing on the second toe    Assessment:     Doing well post osteotomy digital correction    Plan:     X-rays reviewed and gradual increase in activity levels encouraged. Reappoint 4 weeks or earlier if needed  X-ray report indicates good healing with screw in place good alignment and no indications of movement

## 2016-04-03 ENCOUNTER — Encounter: Payer: Self-pay | Admitting: Family Medicine

## 2016-04-03 ENCOUNTER — Ambulatory Visit (INDEPENDENT_AMBULATORY_CARE_PROVIDER_SITE_OTHER): Payer: PRIVATE HEALTH INSURANCE | Admitting: Family Medicine

## 2016-04-03 VITALS — BP 112/84 | HR 100 | Temp 97.8°F | Ht 74.0 in | Wt 245.2 lb

## 2016-04-03 DIAGNOSIS — J0100 Acute maxillary sinusitis, unspecified: Secondary | ICD-10-CM

## 2016-04-03 MED ORDER — AMOXICILLIN-POT CLAVULANATE 875-125 MG PO TABS: 1.0000 | ORAL_TABLET | Freq: Two times a day (BID) | ORAL | 0 refills | Status: DC

## 2016-04-03 NOTE — Patient Instructions (Signed)
Take the antibiotic (Augmentin) as instructed.  Tessalon for cough if needed.  I hope you fell better soon.  Seek care if worsening, new concerns or not improving on treatment.

## 2016-04-03 NOTE — Progress Notes (Signed)
Pre visit review using our clinic review tool, if applicable. No additional management support is needed unless otherwise documented below in the visit note. 

## 2016-04-03 NOTE — Progress Notes (Signed)
HPI:  URI/Sinusitis: -started: several weeks ago and worsening -symptoms:nasal congestion, sore throat, cough, thick sinus congestion and pain -denies:fever, SOB, NVD, tooth pain -sick contacts/travel/risks: no reported flu, strep or tick exposure  ROS: See pertinent positives and negatives per HPI.  Past Medical History:  Diagnosis Date  . Abdominal pain    right side radiating to left side  . Allergy   . ELECTROCARDIOGRAM, ABNORMAL 12/28/2008   Qualifier: Diagnosis of  By: Eden EmmsNishan, MD, Harrington ChallengerFACC, Peter Charles   . GERD (gastroesophageal reflux disease)    hx ulcer  . H. pylori infection   . Osteoarthritis   . Plantar fasciitis   . Post-operative nausea and vomiting    1 time  . Sinusitis, chronic 12/22/2014   -hx sinus surgery, sees ENT     Past Surgical History:  Procedure Laterality Date  . KNEE ARTHROSCOPY W/ MENISCAL REPAIR     bilateral  . WISDOM TOOTH EXTRACTION      Family History  Problem Relation Age of Onset  . Stroke Mother   . Heart disease Mother     valvular  . Colon cancer Mother   . Stroke Father   . Diabetes Father   . Hypertension Father     Social History   Social History  . Marital status: Single    Spouse name: N/A  . Number of children: N/A  . Years of education: N/A   Social History Main Topics  . Smoking status: Never Smoker  . Smokeless tobacco: Never Used  . Alcohol use No     Comment: 1-2 drinks a few times per year  . Drug use: No  . Sexual activity: No   Other Topics Concern  . 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  cetirizine (ZYRTEC) 10 MG tablet, Take 10 mg by mouth daily., Disp: , Rfl:  .  fluticasone (FLONASE) 50 MCG/ACT nasal spray, Place 2 sprays into both nostrils daily., Disp: 16 g, Rfl: 5 .   omeprazole (PRILOSEC) 20 MG capsule, Take 20 mg by mouth daily. Reported on 05/04/2015, Disp: , Rfl:  .  amoxicillin-clavulanate (AUGMENTIN) 875-125 MG tablet, Take 1 tablet by mouth 2 (two) times daily., Disp: 20 tablet, Rfl: 0  EXAM:  Vitals:   04/03/16 1652  BP: 112/84  Pulse: 100  Temp: 97.8 F (36.6 C)    Body mass index is 31.48 kg/m.  GENERAL: vitals reviewed and listed above, alert, oriented, appears well hydrated and in no acute distress  HEENT: atraumatic, conjunttiva clear, no obvious abnormalities on inspection of external nose and ears, normal appearance of ear canals and TMs, thick nasal congestion, mild post oropharyngeal erythema with PND, no tonsillar edema or exudate, no sinus TTP  NECK: no obvious masses on inspection  LUNGS: clear to auscultation bilaterally, no wheezes, rales or rhonchi, good air movement  CV: HRRR, no peripheral edema  MS: moves all extremities without noticeable abnormality  PSYCH: pleasant and cooperative, no obvious depression or anxiety  ASSESSMENT AND PLAN:  Discussed the following assessment and plan:  Acute maxillary sinusitis, recurrence not specified   We discussed potential etiologies, with sinusitis being most likely.  We discussed treatment side effects, likely course, potential complications and signs of developing a serious illness. -of course, we advised to return or notify a doctor immediately if symptoms worsen or persist or new concerns arise.    Patient Instructions  Take the antibiotic (Augmentin) as instructed.  Tessalon for cough if needed.  I hope you fell better soon.  Seek care if worsening, new concerns or not improving on treatment.   Kriste Basque R., DO

## 2016-04-18 ENCOUNTER — Ambulatory Visit (INDEPENDENT_AMBULATORY_CARE_PROVIDER_SITE_OTHER): Payer: PRIVATE HEALTH INSURANCE | Admitting: Podiatry

## 2016-04-18 ENCOUNTER — Ambulatory Visit (INDEPENDENT_AMBULATORY_CARE_PROVIDER_SITE_OTHER): Payer: 59

## 2016-04-18 DIAGNOSIS — M216X9 Other acquired deformities of unspecified foot: Secondary | ICD-10-CM

## 2016-04-18 NOTE — Progress Notes (Signed)
Subjective:     Patient ID: Marcus OfferJeff C Wilkins, male   DOB: [birth year], 51 y.o.   MRN: [id number]  HPI patient states the right foot is doing well with minimal discomfort and pain currently   Review of Systems     Objective:   Physical Exam Neurovascular status intact negative Homans sign noted digit in good alignment second toe metatarsal doing well with mild edema noted    Assessment:     Doing well post osteotomy digital fusion right    Plan:     H&P condition reviewed and recommended continued motion exercises and gradual return to all activity. Patient is working and should have no trouble continuing this activity   X-ray report indicated osteotomies healing well digit in good alignment with no sign of movement

## 2016-05-16 NOTE — Progress Notes (Signed)
HPI:  Here for CPE:  -Concerns and/or follow up today: PMH Obesity, new dx Hypertension, Hyperglycemia, GERD, OA and seasonal allergies here for CPE. He did not want to take BP medications per notes from visit with my colleague and is working on lifestyle changes instead. BP has improved and he feels well. Reports occ issues with "gall stones". He reports saw GI for this in the past and he did see a surgeon but held off on surgery, still not interested in surgery. Has flares here and there and had mild flare of RUQ pain in last 1 week.   -Diabetes and Dyslipidemia Screening: not fasting  -Vaccines: UTD except for flu vaccine  -wants STI testing, Hep C screening (if born [birth year][address]1967): no  -FH colon or prstate ca: see FH Last colon cancer screening: UTD, due in 2020  Last prostate ca screening: discussed risks/benefits - he declined DRE but did opt for PSA  -Alcohol, Tobacco, drug use: see social history  Review of Systems - no fevers, unintentional weight loss, vision loss, hearing loss, chest pain, sob, hemoptysis, melena, hematochezia, hematuria, genital discharge, changing or concerning skin lesions, bleeding, bruising, loc, thoughts of self harm or SI  Past Medical History:  Diagnosis Date  . Abdominal pain    right side radiating to left side  . Allergy   . ELECTROCARDIOGRAM, ABNORMAL 12/28/2008   Qualifier: Diagnosis of  By: Eden EmmsNishan, MD, Harrington ChallengerFACC, Peter Charles   . GERD (gastroesophageal reflux disease)    hx ulcer  . H. pylori infection   . Osteoarthritis   . Plantar fasciitis   . Post-operative nausea and vomiting    1 time  . Sinusitis, chronic 12/22/2014   -hx sinus surgery, sees ENT     Past Surgical History:  Procedure Laterality Date  . KNEE ARTHROSCOPY W/ MENISCAL REPAIR     bilateral  . WISDOM TOOTH EXTRACTION      Family History  Problem Relation Age of Onset  . Stroke Mother   . Heart disease Mother     valvular  . Colon cancer Mother   . Stroke Father    . Diabetes Father   . Hypertension Father     Social History   Social History  . Marital status: Single    Spouse name: N/A  . Number of children: N/A  . Years of education: N/A   Social History Main Topics  . Smoking status: Never Smoker  . Smokeless tobacco: Never Used  . Alcohol use No     Comment: 1-2 drinks a few times per year  . Drug use: No  . Sexual activity: No   Other Topics Concern  . 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  cetirizine (ZYRTEC) 10 MG tablet, Take 10 mg by mouth daily., Disp: , Rfl:  .  fluticasone (FLONASE) 50 MCG/ACT nasal spray, Place 2 sprays into both nostrils daily., Disp: 16 g, Rfl: 5 .  omeprazole (PRILOSEC) 20 MG capsule, Take 20 mg by mouth daily. Reported on 05/04/2015, Disp: , Rfl:   EXAM:  Vitals:   05/17/16 0700  BP: 116/82  Pulse: 85  Temp: 98.3 F (36.8  C)  TempSrc: Oral  Weight: 241 lb 8 oz (109.5 kg)  Height: 6' (1.829 m)    Estimated body mass index is 32.75 kg/m as calculated from the following:   Height as of this encounter: 6' (1.829 m).   Weight as of this encounter: 241 lb 8 oz (109.5 kg).  GENERAL: vitals reviewed and listed below, alert, oriented, appears well hydrated and in no acute distress  HEENT: head atraumatic, PERRLA, normal appearance of eyes, ears, nose and mouth. moist mucus membranes.  NECK: supple, no masses or lymphadenopathy  LUNGS: clear to auscultation bilaterally, no rales, rhonchi or wheeze  CV: HRRR, no peripheral edema or cyanosis, normal pedal pulses  ABDOMEN: bowel sounds normal, soft, non tender to palpation, no masses, no rebound or guarding  GU/DRE: Declined  SKIN: no rash or abnormal lesions  MS: normal gait, moves all extremities normally  NEURO: normal  gait, speech and thought processing grossly intact, muscle tone grossly intact throughout  PSYCH: normal affect, pleasant and cooperative  ASSESSMENT AND PLAN:  Discussed the following assessment and plan:  Encounter for preventive health examination - Plan: PSA -Discussed and advised all [place] preventive services health task force level A and B recommendations for age, sex and risks. --Advised at least 150 minutes of exercise per week and a healthy diet with avoidance of (less then 1 serving per week) processed foods, white starches, red meat, fast foods and sweets and consisting of: * 5-9 servings of fresh fruits and vegetables (not corn or potatoes) *nuts and seeds, beans *olives and olive oil *lean meats such as fish and white chicken  *whole grains -labs, studies and vaccines per orders this encounter  Osteoarthritis of both knees, unspecified osteoarthritis type -discussed treatment options  Gallstones - Plan: Comprehensive metabolic panel -discussed surgery is advised if symptomatic and advised follow up with surgeon, will check LFTs  Hyperlipidemia, unspecified hyperlipidemia type - Plan: HDL cholesterol, Cholesterol, total Hyperglycemia - Plan: Hemoglobin A1c BMI 32.0-32.9,adult Elevated BP without diagnosis of hypertension -BP improved, he doesn't want to take medications -lifestyle recs -labs -follow up 4 months  Patient advised to return to clinic immediately if symptoms worsen or persist or new concerns.  Patient Instructions  BEFORE YOU LEAVE: -flu shot -follow up: 4-6 months -labs  We have ordered labs or studies at this visit. It can take up to 1-2 weeks for results and processing. IF results require follow up or explanation, we will call you with instructions. Clinically stable results will be released to your [facility]. If you have not heard from [place] or cannot find your results in [facility] in 2 weeks please contact our office at [phone].   If you are not yet  signed up for [facility], please SIGN UP TODAY. We now offer online scheduling, same day appointments and extended hours. WHEN YOU DON'T FEEL YOUR BEST.Marland KitchenMarland KitchenWE ARE HERE TO HELP.   We recommend the following healthy lifestyle for LIFE: 1) Small portions.   Tip: eat off of a salad plate instead of a dinner plate.  Tip: It is ok to feel hungry after a meal - that likely means you ate an appropriate portion.  Tip: if you need more or a snack choose fruits, veggies and/or a handful of nuts or seeds.  2) Eat a healthy clean diet.  * Tip: Avoid (less then 1 serving per week): processed foods, sweets, sweetened drinks, white starches (rice, flour, bread, potatoes, pasta, etc), red meat, fast foods, butter  *Tip: CHOOSE instead   *  5-9 servings per day of fresh or frozen fruits and vegetables (but not corn, potatoes, bananas, canned or dried fruit)   *nuts and seeds, beans   *olives and olive oil   *small portions of lean meats such as fish and white chicken    *small portions of whole grains  3)Get at least 150 minutes of sweaty aerobic exercise per week.  4)Reduce stress - consider counseling, meditation and relaxation to balance other aspects of your life.           No Follow-up on file.   Kriste Basque R., DO

## 2016-05-17 ENCOUNTER — Encounter: Payer: Self-pay | Admitting: Family Medicine

## 2016-05-17 ENCOUNTER — Ambulatory Visit (INDEPENDENT_AMBULATORY_CARE_PROVIDER_SITE_OTHER): Payer: PRIVATE HEALTH INSURANCE | Admitting: Family Medicine

## 2016-05-17 VITALS — BP 116/82 | HR 85 | Temp 98.3°F | Ht 72.0 in | Wt 241.5 lb

## 2016-05-17 DIAGNOSIS — Z6832 Body mass index (BMI) 32.0-32.9, adult: Secondary | ICD-10-CM

## 2016-05-17 DIAGNOSIS — R030 Elevated blood-pressure reading, without diagnosis of hypertension: Secondary | ICD-10-CM

## 2016-05-17 DIAGNOSIS — Z6833 Body mass index (BMI) 33.0-33.9, adult: Secondary | ICD-10-CM | POA: Insufficient documentation

## 2016-05-17 DIAGNOSIS — R739 Hyperglycemia, unspecified: Secondary | ICD-10-CM | POA: Diagnosis not present

## 2016-05-17 DIAGNOSIS — Z0000 Encounter for general adult medical examination without abnormal findings: Secondary | ICD-10-CM

## 2016-05-17 DIAGNOSIS — M170 Bilateral primary osteoarthritis of knee: Secondary | ICD-10-CM | POA: Diagnosis not present

## 2016-05-17 DIAGNOSIS — K8020 Calculus of gallbladder without cholecystitis without obstruction: Secondary | ICD-10-CM | POA: Diagnosis not present

## 2016-05-17 DIAGNOSIS — E785 Hyperlipidemia, unspecified: Secondary | ICD-10-CM

## 2016-05-17 DIAGNOSIS — Z23 Encounter for immunization: Secondary | ICD-10-CM

## 2016-05-17 LAB — COMPREHENSIVE METABOLIC PANEL
ALK PHOS: 94 U/L (ref 39–117)
ALT: 27 U/L (ref 0–53)
AST: 22 U/L (ref 0–37)
Albumin: 4.1 g/dL (ref 3.5–5.2)
BUN: 9 mg/dL (ref 6–23)
CO2: 29 mEq/L (ref 19–32)
Calcium: 9.5 mg/dL (ref 8.4–10.5)
Chloride: 104 mEq/L (ref 96–112)
Creatinine, Ser: 0.98 mg/dL (ref 0.40–1.50)
GFR: 102.93 mL/min (ref >60.00)
Glucose, Bld: 102 mg/dL — ABNORMAL HIGH (ref 70–99)
POTASSIUM: 4.2 meq/L (ref 3.5–5.1)
SODIUM: 140 meq/L (ref 135–145)
TOTAL PROTEIN: 7.1 g/dL (ref 6.0–8.3)
Total Bilirubin: 1.4 mg/dL — ABNORMAL HIGH (ref 0.2–1.2)

## 2016-05-17 LAB — HDL CHOLESTEROL: HDL: 35.6 mg/dL — ABNORMAL LOW (ref >39.00)

## 2016-05-17 LAB — HEMOGLOBIN A1C: Hgb A1c MFr Bld: 7.1 % — ABNORMAL HIGH (ref 4.6–6.5)

## 2016-05-17 LAB — CHOLESTEROL, TOTAL: Cholesterol: 147 mg/dL (ref 0–200)

## 2016-05-17 NOTE — Patient Instructions (Signed)
BEFORE YOU LEAVE: -flu shot -follow up: 4-6 months -labs  We have ordered labs or studies at this visit. It can take up to 1-2 weeks for results and processing. IF results require follow up or explanation, we will call you with instructions. Clinically stable results will [facility]. If you have not heard from us or cannot find your results in [facility] in 2 weeks please contact our office at [phone].   If you are not [facility], please SIGN UP TODAY. We now offer online scheduling, same day appointments and extended hours. WHEN YOU DON'T FEEL YOUR BEST.Marland Kitchen.Marland Kitchen.WE ARE HERE TO HELP.   We recommend the following healthy lifestyle for LIFE: 1) Small portions.   Tip: eat off of a salad plate instead of a dinner plate.  Tip: It is ok to feel hungry after a meal - that likely means you ate an appropriate portion.  Tip: if you need more or a snack choose fruits, veggies and/or a handful of nuts or seeds.  2) Eat a healthy clean diet.  * Tip: Avoid (less then 1 serving per week): processed foods, sweets, sweetened drinks, white starches (rice, flour, bread, potatoes, pasta, etc), red meat, fast foods, butter  *Tip: CHOOSE instead   * 5-9 servings per day of fresh or frozen fruits and vegetables (but not corn, potatoes, bananas, canned or dried fruit)   *nuts and seeds, beans   *olives and olive oil   *small portions of lean meats such as fish and white chicken    *small portions of whole grains  3)Get at least 150 minutes of sweaty aerobic exercise per week.  4)Reduce stress - consider counseling, meditation and relaxation to balance other aspects of your life.

## 2016-05-17 NOTE — Progress Notes (Signed)
Pre visit review using our clinic review tool, if applicable. No additional management support is needed unless otherwise documented below in the visit note. 

## 2016-05-18 LAB — PSA: PSA: 1.37 ng/mL (ref 0.10–4.00)

## 2016-05-21 ENCOUNTER — Ambulatory Visit (INDEPENDENT_AMBULATORY_CARE_PROVIDER_SITE_OTHER): Payer: 59

## 2016-05-21 ENCOUNTER — Ambulatory Visit (INDEPENDENT_AMBULATORY_CARE_PROVIDER_SITE_OTHER): Payer: 59 | Admitting: Podiatry

## 2016-05-21 DIAGNOSIS — M7750 Other enthesopathy of unspecified foot: Secondary | ICD-10-CM

## 2016-05-21 DIAGNOSIS — M216X9 Other acquired deformities of unspecified foot: Secondary | ICD-10-CM | POA: Diagnosis not present

## 2016-05-21 DIAGNOSIS — M779 Enthesopathy, unspecified: Secondary | ICD-10-CM

## 2016-05-21 MED ORDER — DICLOFENAC SODIUM 75 MG PO TBEC: 75.0000 mg | DELAYED_RELEASE_TABLET | Freq: Two times a day (BID) | ORAL | 2 refills | Status: DC

## 2016-05-21 NOTE — Progress Notes (Signed)
Subjective:     Patient ID: Marcus Wilkins, male   DOB: 12-25-1965, 51 y.o.   MRN: [id number]  HPI patient states she's doing pretty well but still having some pain especially underneath the big toe joint with the second doing pretty well   Review of Systems     Objective:   Physical Exam Neurovascular status intact negative Homans sign noted with improvement of the second MPJ right with mild discomfort noted but quite a bit better with discomfort around the first MPJ plantar base and slightly distal    Assessment:     Doing well post surgery right with good healing of the osteotomy position of component of the toe with mild symptoms within the capsule of the right first MPJ    Plan:     H&P condition reviewed and x-rays taken. I've recommended for this we will make an accommodation is orthotic and it was done by her pad orthotist who was able to do a procedure to try to take pressure off the first MPJ. Reappoint to reevaluate again in 1 month or earlier if any

## 2016-06-06 ENCOUNTER — Telehealth: Payer: Self-pay | Admitting: Family Medicine

## 2016-06-06 NOTE — Telephone Encounter (Signed)
Pt returning your call

## 2016-06-07 NOTE — Telephone Encounter (Signed)
See results note. 

## 2016-06-17 NOTE — Progress Notes (Signed)
HPI:  Follow up Diaetes: -last Hgba1c 7.1 -PMH HTN, refused treatment, podiatrist started NSAID -he prefers to treat the diabetes with lifestyle as well - really does not want to take medications -admits can improve on exercise -diet weaknesses: 2 sodas daily, lots of corn, potatoes, bad fats  Mildly elevated t. Bili: -hx gallstones -intermittent RUQ pain -saw GI in the past and surgeon but declined surgery  ROS: See pertinent positives and negatives per HPI.  Past Medical History:  Diagnosis Date  . Abdominal pain    right side radiating to left side  . Allergy   . ELECTROCARDIOGRAM, ABNORMAL 12/28/2008   Qualifier: Diagnosis of  By: Eden EmmsNishan, MD, Harrington ChallengerFACC, Peter Charles   . GERD (gastroesophageal reflux disease)    hx ulcer  . H. pylori infection   . Osteoarthritis   . Plantar fasciitis   . Post-operative nausea and vomiting    1 time  . Sinusitis, chronic 12/22/2014   -hx sinus surgery, sees ENT     Past Surgical History:  Procedure Laterality Date  . KNEE ARTHROSCOPY W/ MENISCAL REPAIR     bilateral  . WISDOM TOOTH EXTRACTION      Family History  Problem Relation Age of Onset  . Stroke Mother   . Heart disease Mother     valvular  . Colon cancer Mother   . Stroke Father   . Diabetes Father   . Hypertension Father     Social History   Social History  . Marital status: Single    Spouse name: N/A  . Number of children: N/A  . Years of education: N/A   Social History Main Topics  . Smoking status: Never Smoker  . Smokeless tobacco: Never Used  . Alcohol use No     Comment: 1-2 drinks a few times per year  . Drug use: No  . Sexual activity: No   Other Topics Concern  . 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  cetirizine (ZYRTEC) 10  MG tablet, Take 10 mg by mouth daily., Disp: , Rfl:  .  diclofenac (VOLTAREN) 75 MG EC tablet, Take 1 tablet (75 mg total) by mouth 2 (two) times daily., Disp: 50 tablet, Rfl: 2 .  fluticasone (FLONASE) 50 MCG/ACT nasal spray, Place 2 sprays into both nostrils daily., Disp: 16 g, Rfl: 5 .  omeprazole (PRILOSEC) 20 MG capsule, Take 20 mg by mouth daily. Reported on 05/04/2015, Disp: , Rfl:   EXAM:  Vitals:   06/18/16 1144  BP: 118/80  Pulse: 75  Temp: 97.9 F (36.6 C)    Body mass index is 32.9 kg/m.  GENERAL: vitals reviewed and listed above, alert, oriented, appears well hydrated and in no acute distress  HEENT: atraumatic, conjunttiva clear, no obvious abnormalities on inspection of external nose and ears  NECK: no obvious masses on inspection  LUNGS: clear to auscultation bilaterally, no wheezes, rales or rhonchi, good air movement  CV: HRRR, no peripheral edema  MS: moves all extremities without noticeable abnormality  PSYCH: pleasant and cooperative, no obvious depression or anxiety  ASSESSMENT AND PLAN:  Discussed the following assessment and plan:  BMI 32.0-32.9,adult  Diabetes mellitus without complication (HCC)  Hyperlipidemia, unspecified hyperlipidemia type  Gallstones  Serum total bilirubin elevated  -discussed labs/implications/recommended further evaluation/treatment -he plans to work on improving diet and exercise - lifestyle discussion at length -he refused medications for now -he plans to consider following up with surgeon regarding GB -labs at follow up in 3 months -Patient advised to return or notify a doctor immediately if symptoms worsen or persist or new concerns arise.  Patient Instructions  BEFORE YOU LEAVE: -follow up: in June as scheduled   We recommend the following healthy lifestyle for LIFE: 1) Small portions.   Tip: eat off of a salad plate instead of a dinner plate.  Tip: It is ok to feel hungry after a meal.  Tip: if you need  more or a snack choose fruits, veggies and/or a handful of nuts or seeds.  2) Eat a healthy clean diet.  * Tip: Avoid (less then 1 serving per week): processed foods, sweets, sweetened drinks, white starches (rice, flour, corn, bread, potatoes, pasta, etc), red meat, fast foods, butter  *Tip: CHOOSE instead   * 5-9 servings per day of fresh or frozen fruits and vegetables (but not corn, potatoes, bananas, canned or dried fruit)   *nuts and seeds, beans   *olives and olive oil   *small portions of lean meats such as fish and white chicken    *small portions of whole grains  3)Get at least 150 minutes of sweaty aerobic exercise per week.  4)Reduce stress - consider counseling, meditation and relaxation to balance other aspects of your life.     Kriste Basque R., DO

## 2016-06-18 ENCOUNTER — Encounter: Payer: Self-pay | Admitting: Family Medicine

## 2016-06-18 ENCOUNTER — Ambulatory Visit (INDEPENDENT_AMBULATORY_CARE_PROVIDER_SITE_OTHER): Payer: PRIVATE HEALTH INSURANCE | Admitting: Family Medicine

## 2016-06-18 VITALS — BP 118/80 | HR 75 | Temp 97.9°F | Ht 72.0 in | Wt 242.6 lb

## 2016-06-18 DIAGNOSIS — E785 Hyperlipidemia, unspecified: Secondary | ICD-10-CM | POA: Diagnosis not present

## 2016-06-18 DIAGNOSIS — Z6832 Body mass index (BMI) 32.0-32.9, adult: Secondary | ICD-10-CM

## 2016-06-18 DIAGNOSIS — E119 Type 2 diabetes mellitus without complications: Secondary | ICD-10-CM

## 2016-06-18 DIAGNOSIS — K8020 Calculus of gallbladder without cholecystitis without obstruction: Secondary | ICD-10-CM | POA: Diagnosis not present

## 2016-06-18 DIAGNOSIS — R17 Unspecified jaundice: Secondary | ICD-10-CM | POA: Insufficient documentation

## 2016-06-18 NOTE — Progress Notes (Signed)
Pre visit review using our clinic review tool, if applicable. No additional management support is needed unless otherwise documented below in the visit note. 

## 2016-06-18 NOTE — Patient Instructions (Addendum)
BEFORE YOU LEAVE: -follow up: in June as scheduled   We recommend the following healthy lifestyle for LIFE: 1) Small portions.   Tip: eat off of a salad plate instead of a dinner plate.  Tip: It is ok to feel hungry after a meal.  Tip: if you need more or a snack choose fruits, veggies and/or a handful of nuts or seeds.  2) Eat a healthy clean diet.  * Tip: Avoid (less then 1 serving per week): processed foods, sweets, sweetened drinks, white starches (rice, flour, corn, bread, potatoes, pasta, etc), red meat, fast foods, butter  *Tip: CHOOSE instead   * 5-9 servings per day of fresh or frozen fruits and vegetables (but not corn, potatoes, bananas, canned or dried fruit)   *nuts and seeds, beans   *olives and olive oil   *small portions of lean meats such as fish and white chicken    *small portions of whole grains  3)Get at least 150 minutes of sweaty aerobic exercise per week.  4)Reduce stress - consider counseling, meditation and relaxation to balance other aspects of your life.

## 2016-07-16 ENCOUNTER — Ambulatory Visit (INDEPENDENT_AMBULATORY_CARE_PROVIDER_SITE_OTHER): Payer: Self-pay | Admitting: Podiatry

## 2016-07-16 ENCOUNTER — Encounter: Payer: Self-pay | Admitting: Podiatry

## 2016-07-16 ENCOUNTER — Ambulatory Visit (INDEPENDENT_AMBULATORY_CARE_PROVIDER_SITE_OTHER): Payer: PRIVATE HEALTH INSURANCE

## 2016-07-16 DIAGNOSIS — M2041 Other hammer toe(s) (acquired), right foot: Secondary | ICD-10-CM

## 2016-07-16 DIAGNOSIS — Z9889 Other specified postprocedural states: Secondary | ICD-10-CM

## 2016-07-16 NOTE — Progress Notes (Signed)
Subjective:    Patient ID: Marcus Wilkins, male   DOB: 51 y.o.   MRN: [id number]   HPI patient states I'm doing pretty well with my right foot and I'm not in any pain currently but at times it still swells    ROS      Objective:  Physical Exam Neurovascular status intact negative Homans sign noted with well aligned second digit right with wound edges well coapted and no discomfort when I pressed into the metatarsophalangeal joint    Assessment:   Improvement occurring with chronic pathology with mild elevation of the toe but excellent structural correction and minimal discomfort      Plan:     H&P and x-ray reviewed with patient and at this point I went ahead and I'm allowing him to return to normal shoe gear. I explained what to do if any swelling should occur or any issues and patient will be seen back if there is any problems  X-ray report indicates the osteotomy is healing well with good positional alignment and no signs of movement

## 2016-09-13 NOTE — Progress Notes (Signed)
HPI:  Marcus Wilkins is a pleasant 51 y.o. here for follow up. Chronic medical problems summarized below were reviewed for changes and stability and were updated as needed below. Hx of poor compliance and refused pharmacological tx for his conditions. Poor diet and sedentary in the past. Reports was doing well, then started night shifts the last 1 month and diet poor again and no exercise. Starches and sweet tea are weakness in his diet. He still is reluctant to treat with pharmacological interventions.  Denies CP, SOB, DOE, wounds, fatigue, HA, vision changes, treatment intolerance or new symptoms.  Follow up Diaetes: -last Hgba1c 7.1 -refused pharmacological treatment, podiatrist started NSAID -poor diet, no exercise -due for foot exam and eye exam -agrees to pneumonia vaccine  HTN/Obesity: -poor diet and no exercise -refused medications in the past -agreed to losartan 09/14/16 after repeat discussion risks/benefits  ROS: See pertinent positives and negatives per HPI.  Past Medical History:  Diagnosis Date  . Abdominal pain    right side radiating to left side  . Allergy   . ELECTROCARDIOGRAM, ABNORMAL 12/28/2008   Qualifier: Diagnosis of  By: Eden Emms, MD, Harrington Challenger   . GERD (gastroesophageal reflux disease)    hx ulcer  . H. pylori infection   . Osteoarthritis   . Plantar fasciitis   . Post-operative nausea and vomiting    1 time  . Sinusitis, chronic 12/22/2014   -hx sinus surgery, sees ENT     Past Surgical History:  Procedure Laterality Date  . KNEE ARTHROSCOPY W/ MENISCAL REPAIR     bilateral  . WISDOM TOOTH EXTRACTION      Family History  Problem Relation Age of Onset  . Stroke Mother   . Heart disease Mother        valvular  . Colon cancer Mother   . Stroke Father   . Diabetes Father   . Hypertension Father     Social History   Social History  . Marital status: Single    Spouse name: N/A  . Number of children: N/A  . Years of education: N/A    Social History Main Topics  . Smoking status: Never Smoker  . Smokeless tobacco: Never Used  . Alcohol use No     Comment: 1-2 drinks a few times per year  . Drug use: No  . Sexual activity: No   Other Topics Concern  . 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  cetirizine (ZYRTEC) 10 MG tablet, Take 10 mg by mouth daily., Disp: , Rfl:  .  diclofenac (VOLTAREN) 75 MG EC tablet, Take 1 tablet (75 mg total) by mouth 2 (two) times daily., Disp: 50 tablet, Rfl: 2 .  fluticasone (FLONASE) 50 MCG/ACT nasal spray, Place 2 sprays into both nostrils daily., Disp: 16 g, Rfl: 5 .  omeprazole (PRILOSEC) 20 MG capsule, Take 20 mg by mouth daily. Reported on 05/04/2015, Disp: , Rfl:  .  losartan (COZAAR) 50 MG tablet, Take 1 tablet (50 mg total) by mouth daily., Disp: 90 tablet, Rfl: 3  EXAM:  Vitals:   09/14/16 0845  BP: (!) 132/96  Pulse: 82  Temp: 98.8 F (37.1 C)    Body mass index is 33.55 kg/m.  GENERAL: vitals reviewed and listed above, alert, oriented, appears well hydrated and in no acute distress  HEENT: atraumatic, conjunttiva clear, no obvious abnormalities on inspection of external nose and ears  NECK: no obvious masses on inspection  LUNGS: clear to auscultation bilaterally, no wheezes, rales or rhonchi, good air movement  CV: HRRR, no peripheral edema  MS: moves all extremities without noticeable abnormality  PSYCH: pleasant and cooperative, no obvious depression or anxiety  FOOT EXAM: done  ASSESSMENT AND PLAN:  Discussed the following assessment and plan:  Diabetes mellitus without complication (HCC) - Plan: Hemoglobin A1c, Microalbumin / creatinine urine ratio -labs -implications, complications uncontrolled disease and treatment options  discussed at length -he voices really wants to change diet and feels he is able to do this on his own -foot exam done -advised eye exam  Hyperlipidemia associated with type 2 diabetes mellitus (HCC) -labs, lifestyle recs  Hypertension associated with diabetes (HCC) - Plan: Basic metabolic panel, CBC, losartan (COZAAR) 50 MG tablet -discussed implications and risks, he agreed to start losartan after discussion options and risks -labs -lifestyle recs  BMI 33.0-33.9,adult -lifestyle recs  Need for 23-polyvalent pneumococcal polysaccharide vaccine - Plan: Pneumococcal polysaccharide vaccine 23-valent greater than or equal to 2yo subcutaneous/IM  -Patient advised to return or notify a doctor immediately if symptoms worsen or persist or new concerns arise.  Patient Instructions  BEFORE YOU LEAVE: -pneumococcal 23 vaccine -follow up: 1 month blood pressure and diet -lab  Start the losartan for the blood pressure and kidneys - take 50 mg once daily.  Eat a healthy clean low sugar diet.   Schedule a diabetic eye exam.  Get at least 150 minutes of aerobic exercise every week.  We have ordered labs or studies at this visit. It can take up to 1-2 weeks for results and processing. IF results require follow up or explanation, we will call you with instructions. Clinically stable results will be released to your [facility]. If you have not heard from us or cannot find your results in [facility] in 2 weeks please contact our office at [phone].  If you are not yet signed up for [facility], please consider signing up.            Kriste BasqueKIM, Myra Weng R., DO

## 2016-09-14 ENCOUNTER — Encounter: Payer: Self-pay | Admitting: Family Medicine

## 2016-09-14 ENCOUNTER — Ambulatory Visit (INDEPENDENT_AMBULATORY_CARE_PROVIDER_SITE_OTHER): Payer: PRIVATE HEALTH INSURANCE | Admitting: Family Medicine

## 2016-09-14 VITALS — BP 132/96 | HR 82 | Temp 98.8°F | Wt 247.4 lb

## 2016-09-14 DIAGNOSIS — Z6833 Body mass index (BMI) 33.0-33.9, adult: Secondary | ICD-10-CM | POA: Diagnosis not present

## 2016-09-14 DIAGNOSIS — Z23 Encounter for immunization: Secondary | ICD-10-CM | POA: Diagnosis not present

## 2016-09-14 DIAGNOSIS — I152 Hypertension secondary to endocrine disorders: Secondary | ICD-10-CM | POA: Insufficient documentation

## 2016-09-14 DIAGNOSIS — E119 Type 2 diabetes mellitus without complications: Secondary | ICD-10-CM

## 2016-09-14 DIAGNOSIS — I10 Essential (primary) hypertension: Secondary | ICD-10-CM

## 2016-09-14 DIAGNOSIS — E785 Hyperlipidemia, unspecified: Secondary | ICD-10-CM

## 2016-09-14 DIAGNOSIS — E1169 Type 2 diabetes mellitus with other specified complication: Secondary | ICD-10-CM

## 2016-09-14 DIAGNOSIS — E1159 Type 2 diabetes mellitus with other circulatory complications: Secondary | ICD-10-CM

## 2016-09-14 LAB — CBC
HCT: 49.6 % (ref 39.0–52.0)
HEMOGLOBIN: 16.3 g/dL (ref 13.0–17.0)
MCHC: 32.9 g/dL (ref 30.0–36.0)
MCV: 88 fl (ref 78.0–100.0)
PLATELETS: 197 10*3/uL (ref 150.0–400.0)
RBC: 5.64 Mil/uL (ref 4.22–5.81)
RDW: 15 % (ref 11.5–15.5)
WBC: 8.6 10*3/uL (ref 4.0–10.5)

## 2016-09-14 LAB — BASIC METABOLIC PANEL
BUN: 10 mg/dL (ref 6–23)
CHLORIDE: 101 meq/L (ref 96–112)
CO2: 30 meq/L (ref 19–32)
Calcium: 10.1 mg/dL (ref 8.4–10.5)
Creatinine, Ser: 1.01 mg/dL (ref 0.40–1.50)
GFR: 99.29 mL/min (ref >60.00)
GLUCOSE: 119 mg/dL — AB (ref 70–99)
POTASSIUM: 4.3 meq/L (ref 3.5–5.1)
SODIUM: 140 meq/L (ref 135–145)

## 2016-09-14 LAB — MICROALBUMIN / CREATININE URINE RATIO
CREATININE, U: 259.4 mg/dL
Microalb Creat Ratio: 0.4 mg/g (ref 0.0–30.0)
Microalb, Ur: 1 mg/dL (ref 0.0–1.9)

## 2016-09-14 LAB — HEMOGLOBIN A1C: Hgb A1c MFr Bld: 6.8 % — ABNORMAL HIGH (ref 4.6–6.5)

## 2016-09-14 MED ORDER — LOSARTAN POTASSIUM 50 MG PO TABS: 50.0000 mg | ORAL_TABLET | Freq: Every day | ORAL | 3 refills | Status: DC

## 2016-09-14 NOTE — Patient Instructions (Addendum)
BEFORE YOU LEAVE: -pneumococcal 23 vaccine -follow up: 1 month blood pressure and diet -lab  Start the losartan for the blood pressure and kidneys - take 50 mg once daily.  Eat a healthy clean low sugar diet.   Schedule a diabetic eye exam.  Get at least 150 minutes of aerobic exercise every week.  We have ordered labs or studies at this visit. It can take up to 1-2 weeks for results and processing. IF results require follow up or explanation, we will call you with instructions. Clinically stable results will be released to your [facility]. If you have not heard from us or cannot find your results in [facility] in 2 weeks please contact our office at [phone].  If you are not yet signed up for [facility], please consider signing up.

## 2016-10-15 NOTE — Progress Notes (Signed)
HPI:  Started losartan last visit. Wanted to follow up on diet and BP today. Wanted to do lifestyle changes instead of medications for his diabetes and obesity. Wt last visit 247 --> 237! Eating better - cutting back on sugar and getting more exercise. Tolerating losartan well. Has had URI this last 4-5 days - nasal cong, cough, drainage, sore throat, ear discomfort, sub fevers - now improving some. No SOB, NVD. Due for eye exam, BMP.  Follow up Diaetes: -hgba1c 09/14/16 6.8 -refused pharmacological treatment, podiatrist started NSAID -improving diet and exercising  HTN/Obesity: -see above -refused medications in the past -agreed to losartan 09/14/16 after repeat discussion risks/benefits   ROS: See pertinent positives and negatives per HPI.  Past Medical History:  Diagnosis Date  . Abdominal pain    right side radiating to left side  . Allergy   . ELECTROCARDIOGRAM, ABNORMAL 12/28/2008   Qualifier: Diagnosis of  By: Eden Emms, MD, Harrington Challenger   . GERD (gastroesophageal reflux disease)    hx ulcer  . H. pylori infection   . Osteoarthritis   . Plantar fasciitis   . Post-operative nausea and vomiting    1 time  . Sinusitis, chronic 12/22/2014   -hx sinus surgery, sees ENT     Past Surgical History:  Procedure Laterality Date  . KNEE ARTHROSCOPY W/ MENISCAL REPAIR     bilateral  . WISDOM TOOTH EXTRACTION      Family History  Problem Relation Age of Onset  . Stroke Mother   . Heart disease Mother        valvular  . Colon cancer Mother   . Stroke Father   . Diabetes Father   . Hypertension Father     Social History   Social History  . Marital status: Single    Spouse name: N/A  . Number of children: N/A  . Years of education: N/A   Social History Main Topics  . Smoking status: Never Smoker  . Smokeless tobacco: Never Used  . Alcohol use No     Comment: 1-2 drinks a few times per year  . Drug use: No  . Sexual activity: No   Other Topics Concern   . 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  cetirizine (ZYRTEC) 10 MG tablet, Take 10 mg by mouth daily., Disp: , Rfl:  .  diclofenac (VOLTAREN) 75 MG EC tablet, Take 1 tablet (75 mg total) by mouth 2 (two) times daily., Disp: 50 tablet, Rfl: 2 .  fluticasone (FLONASE) 50 MCG/ACT nasal spray, Place 2 sprays into both nostrils daily., Disp: 16 g, Rfl: 5 .  losartan (COZAAR) 50 MG tablet, Take 1 tablet (50 mg total) by mouth daily., Disp: 90 tablet, Rfl: 3 .  omeprazole (PRILOSEC) 20 MG capsule, Take 20 mg by mouth daily. Reported on 05/04/2015, Disp: , Rfl:  .  benzonatate (TESSALON) 100 MG capsule, Take 1 capsule (100 mg total) by mouth 2 (two) times daily as needed for cough., Disp: 20 capsule, Rfl: 0  EXAM:  Vitals:   10/16/16 1010  BP: 118/82  Pulse: 89  Temp: 98.4 F (36.9 C)    Body  mass index is 32.2 kg/m.  GENERAL: vitals reviewed and listed above, alert, oriented, appears well hydrated and in no acute distress  HEENT: atraumatic, conjunttiva clear, no obvious abnormalities on inspection of external nose and ears, normal appearance of ear canals and TMs, clear nasal congestion, mild post oropharyngeal erythema with PND, no tonsillar edema or exudate, no sinus TTP  NECK: no obvious masses on inspection  LUNGS: clear to auscultation bilaterally, no wheezes, rales or rhonchi, good air movement  CV: HRRR, no peripheral edema  MS: moves all extremities without noticeable abnormality  PSYCH: pleasant and cooperative, no obvious depression or anxiety  ASSESSMENT AND PLAN:  Discussed the following assessment and plan:  Viral upper respiratory tract infection -we discussed possible serious and likely etiologies, workup and treatment, treatment risks and  return precautions - likely VURI -after this discussion, Marcus Wilkins opted for tessalon for cough, home treatments -follow up advised prn -of course, we advised Marcus Wilkins  to return or notify a doctor immediately if symptoms worsen or persist or new concerns arise.  Diabetes mellitus without complication (HCC) BMI 32.0-32.9,adult Hypertension associated with diabetes (HCC) - Plan: Basic metabolic panel Hyperlipidemia associated with type 2 diabetes mellitus (HCC) -congratulated on changes -continue losartan -BMP -lifestyle recs per handout  -Patient advised to return or notify a doctor immediately if symptoms worsen or persist or new concerns arise.  Patient Instructions  BEFORE YOU LEAVE: -follow up: 3 months -lab to check kidneys on new medication  Continue losartan and take every morning.  Continue healthy diet and exercise - see below.  Can use the tessalon for cough as instructed.  I hope you are feeling better soon! Seek care immediately if worsening, new concerns or you are not improving with treatment.   We recommend the following healthy lifestyle for LIFE: 1) Small portions.   Tip: eat off of a salad plate instead of a dinner plate.  Tip: It is ok to feel hungry after a meal - that likely means you ate an appropriate portion.  Tip: if you need more or a snack choose fruits, veggies and/or a handful of nuts or seeds.  2) Eat a healthy clean diet.   TRY TO EAT: -at least 5-7 servings of low sugar vegetables per day (not corn, potatoes or bananas.) -berries are the best choice if you wish to eat fruit.   -lean meets (fish, chicken or [place]key breasts) -vegan proteins for some meals - beans or tofu, whole grains, nuts and seeds -Replace bad fats with good fats - good fats include: fish, nuts and seeds, canola oil, olive oil -small amounts of low fat or non fat dairy -small amounts of100 % whole grains - check the lables  AVOID: -SUGAR, sweets, anything with added sugar, corn  syrup or sweeteners -if you must have a sweetener, small amounts of stevia may be best -sweetened beverages -simple starches (rice, bread, potatoes, pasta, chips, etc - small amounts of 100% whole grains are ok) -red meat, pork, butter -fried foods, fast food, processed food, excessive dairy, eggs and coconut.  3)Get at least 150 minutes of sweaty aerobic exercise per week.  4)Reduce stress - consider counseling, meditation and relaxation to balance other aspects of your life.     Kriste BasqueKIM, HANNAH R., DO

## 2016-10-16 ENCOUNTER — Ambulatory Visit (INDEPENDENT_AMBULATORY_CARE_PROVIDER_SITE_OTHER): Payer: PRIVATE HEALTH INSURANCE | Admitting: Family Medicine

## 2016-10-16 ENCOUNTER — Encounter: Payer: Self-pay | Admitting: Family Medicine

## 2016-10-16 VITALS — BP 118/82 | HR 89 | Temp 98.4°F | Ht 72.0 in | Wt 237.4 lb

## 2016-10-16 DIAGNOSIS — E1159 Type 2 diabetes mellitus with other circulatory complications: Secondary | ICD-10-CM

## 2016-10-16 DIAGNOSIS — I10 Essential (primary) hypertension: Secondary | ICD-10-CM

## 2016-10-16 DIAGNOSIS — Z6832 Body mass index (BMI) 32.0-32.9, adult: Secondary | ICD-10-CM | POA: Diagnosis not present

## 2016-10-16 DIAGNOSIS — E1169 Type 2 diabetes mellitus with other specified complication: Secondary | ICD-10-CM | POA: Diagnosis not present

## 2016-10-16 DIAGNOSIS — J069 Acute upper respiratory infection, unspecified: Secondary | ICD-10-CM | POA: Diagnosis not present

## 2016-10-16 DIAGNOSIS — E119 Type 2 diabetes mellitus without complications: Secondary | ICD-10-CM | POA: Diagnosis not present

## 2016-10-16 DIAGNOSIS — E785 Hyperlipidemia, unspecified: Secondary | ICD-10-CM | POA: Diagnosis not present

## 2016-10-16 MED ORDER — BENZONATATE 100 MG PO CAPS: 100.0000 mg | ORAL_CAPSULE | Freq: Two times a day (BID) | ORAL | 0 refills | Status: DC | PRN

## 2016-10-16 NOTE — Patient Instructions (Signed)
BEFORE YOU LEAVE: -follow up: 3 months -lab to check kidneys on new medication  Continue losartan and take every morning.  Continue healthy diet and exercise - see below.  Can use the tessalon for cough as instructed.  I hope you are feeling better soon! Seek care immediately if worsening, new concerns or you are not improving with treatment.   We recommend the following healthy lifestyle for LIFE: 1) Small portions.   Tip: eat off of a salad plate instead of a dinner plate.  Tip: It is ok to feel hungry after a meal - that likely means you ate an appropriate portion.  Tip: if you need more or a snack choose fruits, veggies and/or a handful of nuts or seeds.  2) Eat a healthy clean diet.   TRY TO EAT: -at least 5-7 servings of low sugar vegetables per day (not corn, potatoes or bananas.) -berries are the best choice if you wish to eat fruit.   -lean meets (fish, chicken or [place] breasts) -vegan proteins for some meals - beans or tofu, whole grains, nuts and seeds -Replace bad fats with good fats - good fats include: fish, nuts and seeds, canola oil, olive oil -small amounts of low fat or non fat dairy -small amounts of100 % whole grains - check the lables  AVOID: -SUGAR, sweets, anything with added sugar, corn syrup or sweeteners -if you must have a sweetener, small amounts of stevia may be best -sweetened beverages -simple starches (rice, bread, potatoes, pasta, chips, etc - small amounts of 100% whole grains are ok) -red meat, pork, butter -fried foods, fast food, processed food, excessive dairy, eggs and coconut.  3)Get at least 150 minutes of sweaty aerobic exercise per week.  4)Reduce stress - consider counseling, meditation and relaxation to balance other aspects of your life.

## 2017-01-13 NOTE — Progress Notes (Deleted)
HPI:  Marcus Wilkins is a pleasant 51 y.o. here for follow up. Chronic medical problems summarized below were reviewed for changes. ***. Denies CP, SOB, DOE, treatment intolerance or new symptoms. Due for eye exam, flu vaccine, labs: bmp, cbc  Diaetes: -hgba1c 09/14/16 6.8 -refused pharmacological treatment, podiatrist started NSAID -improving diet and exercising  HTN/Obesity: -see above -refused medications in the past -agreed to losartan 09/14/16 after repeat discussion risks/benefits  ROS: See pertinent positives and negatives per HPI.  Past Medical History:  Diagnosis Date  . Abdominal pain    right side radiating to left side  . Allergy   . ELECTROCARDIOGRAM, ABNORMAL 12/28/2008   Qualifier: Diagnosis of  By: Eden EmmsNishan, MD, Harrington ChallengerFACC, Peter Charles   . GERD (gastroesophageal reflux disease)    hx ulcer  . H. pylori infection   . Osteoarthritis   . Plantar fasciitis   . Post-operative nausea and vomiting    1 time  . Sinusitis, chronic 12/22/2014   -hx sinus surgery, sees ENT     Past Surgical History:  Procedure Laterality Date  . KNEE ARTHROSCOPY W/ MENISCAL REPAIR     bilateral  . WISDOM TOOTH EXTRACTION      Family History  Problem Relation Age of Onset  . Stroke Mother   . Heart disease Mother        valvular  . Colon cancer Mother   . Stroke Father   . Diabetes Father   . Hypertension Father     Social History   Social History  . Marital status: Single    Spouse name: N/A  . Number of children: N/A  . Years of education: N/A   Social History Main Topics  . Smoking status: Never Smoker  . Smokeless tobacco: Never Used  . Alcohol use No     Comment: 1-2 drinks a few times per year  . Drug use: No  . Sexual activity: No   Other Topics Concern  . Not on file   Social History Narrative   Work or School: post Producer, television/film/videooffice supervisor      Home Situation: lives with parents      Spiritual Beliefs: Christian      Lifestyle: no CV exercise; diet is good                     Current Outpatient Prescriptions:  .  benzonatate (TESSALON) 100 MG capsule, Take 1 capsule (100 mg total) by mouth 2 (two) times daily as needed for cough., Disp: 20 capsule, Rfl: 0 .  cetirizine (ZYRTEC) 10 MG tablet, Take 10 mg by mouth daily., Disp: , Rfl:  .  diclofenac (VOLTAREN) 75 MG EC tablet, Take 1 tablet (75 mg total) by mouth 2 (two) times daily., Disp: 50 tablet, Rfl: 2 .  fluticasone (FLONASE) 50 MCG/ACT nasal spray, Place 2 sprays into both nostrils daily., Disp: 16 g, Rfl: 5 .  losartan (COZAAR) 50 MG tablet, Take 1 tablet (50 mg total) by mouth daily., Disp: 90 tablet, Rfl: 3 .  omeprazole (PRILOSEC) 20 MG capsule, Take 20 mg by mouth daily. Reported on 05/04/2015, Disp: , Rfl:   EXAM:  There were no vitals filed for this visit.  There is no height or weight on file to calculate BMI.  GENERAL: vitals reviewed and listed above, alert, oriented, appears well hydrated and in no acute distress  HEENT: atraumatic, conjunttiva clear, no obvious abnormalities on inspection of external nose and ears  NECK: no obvious masses on  inspection  LUNGS: clear to auscultation bilaterally, no wheezes, rales or rhonchi, good air movement  CV: HRRR, no peripheral edema  MS: moves all extremities without noticeable abnormality  PSYCH: pleasant and cooperative, no obvious depression or anxiety  ASSESSMENT AND PLAN:  Discussed the following assessment and plan:  No diagnosis found.  -Patient advised to return or notify a doctor immediately if symptoms worsen or persist or new concerns arise.  There are no Patient Instructions on file for this visit.  Colin Benton R., DO

## 2017-01-14 ENCOUNTER — Ambulatory Visit: Payer: POS | Admitting: Family Medicine

## 2017-01-14 DIAGNOSIS — Z0289 Encounter for other administrative examinations: Secondary | ICD-10-CM

## 2017-03-12 IMAGING — MR MR HEAD WO/W CM
12 series · 44 of 48 positions shown · IV contrast (multihance)
Comparison: None.

CLINICAL DATA: Temporal headache for 2 weeks which sharp and
stabbing pain, new.

EXAM:
MRI HEAD WITHOUT AND WITH CONTRAST
TECHNIQUE: Multiplanar, multiecho pulse sequences of the brain and surrounding
structures were obtained without and with intravenous contrast.
CONTRAST:  20mL MULTIHANCE GADOBENATE DIMEGLUMINE 529 MG/ML IV SOLN

[Series 2: t1_se_sag · sagittal · 5.0mm · 0.47mm/px · 2 of 21 slices shown]
[im 1/21]
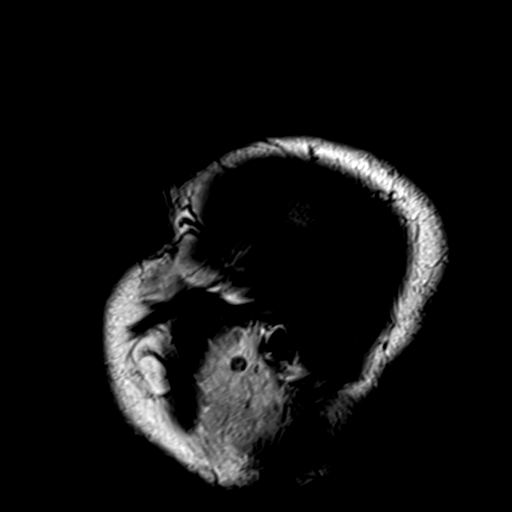
[im 21/21]
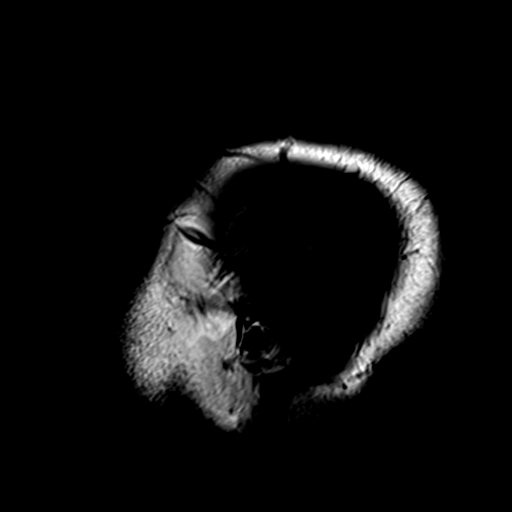

[Series 3: ep2d_diff_(id)_trace · axial · 3.0mm · 1.80mm/px · z∈[-2,+135]mm · 8 of 99 slices shown]
[im 1/99]
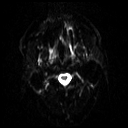
[im 15/99]
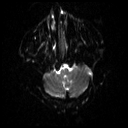
[im 29/99]
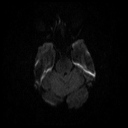
[im 43/99]
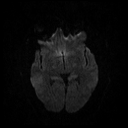
[im 57/99]
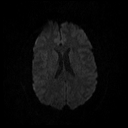
[im 71/99]
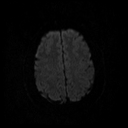
[im 85/99]
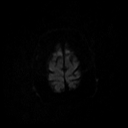
[im 99/99]
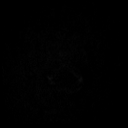

[Series 4: ep2d_diff_(id)_trace_adc · axial · 3.0mm · 1.80mm/px · z∈[-2,+135]mm · 4 of 50 slices shown]
[im 1/50]
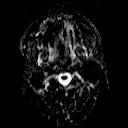
[im 17/50]
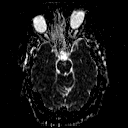
[im 33/50]
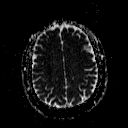
[im 50/50]
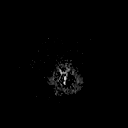

[Series 6: swi_images · axial · 2.0mm · 0.90mm/px · z∈[-8,+141]mm · 6 of 80 slices shown]
[im 1/80]
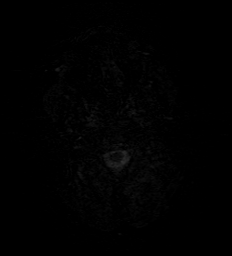
[im 16/80]
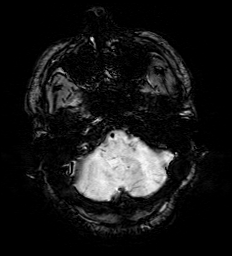
[im 32/80]
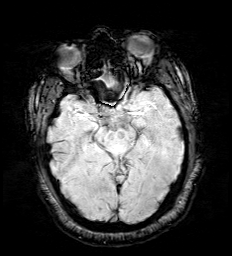
[im 48/80]
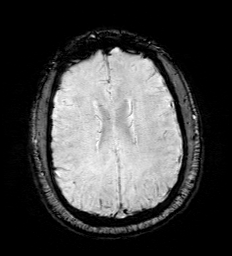
[im 64/80]
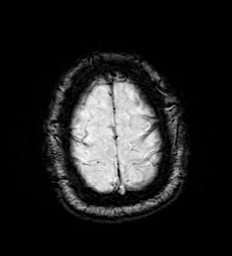
[im 80/80]
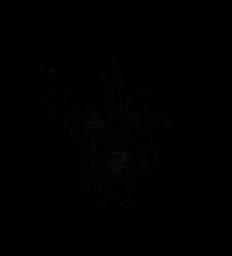

[Series 7: ep2d_diff cor for · coronal · 5.0mm · 0.90mm/px · 5 of 65 slices shown (1 of 2)]
[im 1/65]
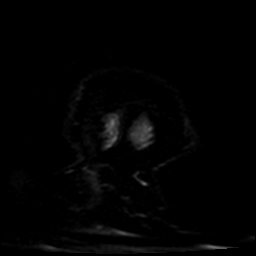
[im 17/65]
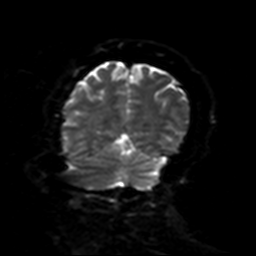
[im 33/65]
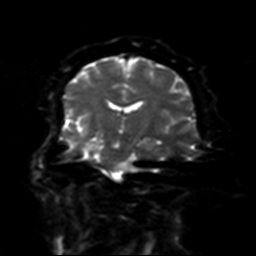
[im 49/65]
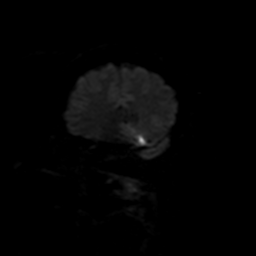
[im 65/65]
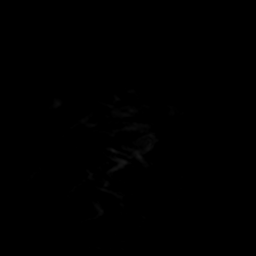

[Series 8: ep2d_diff cor for · coronal · 5.0mm · 0.90mm/px · 3 of 34 slices shown (2 of 2)]
[im 1/34]
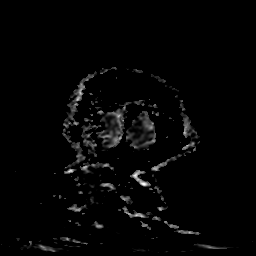
[im 17/34]
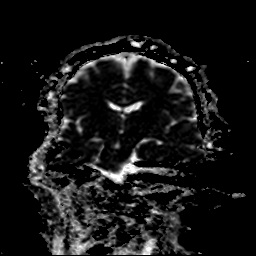
[im 34/34]
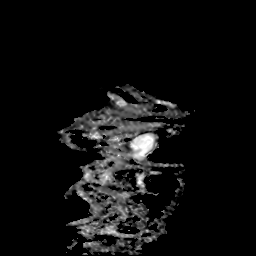

[Series 9: FLAIR · axial · 5.0mm · 0.45mm/px · z∈[+6,+133]mm · 2 of 23 slices shown]
[im 1/23]
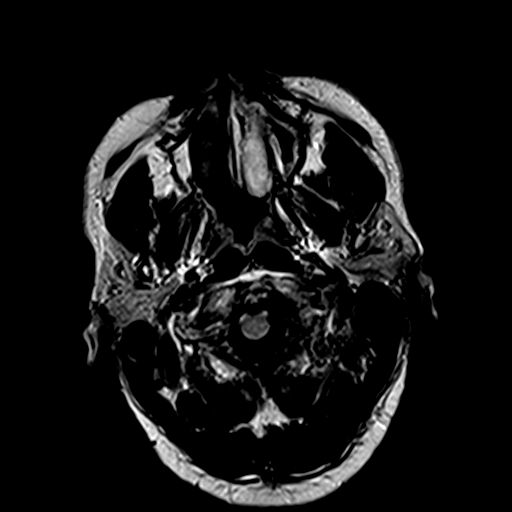
[im 23/23]
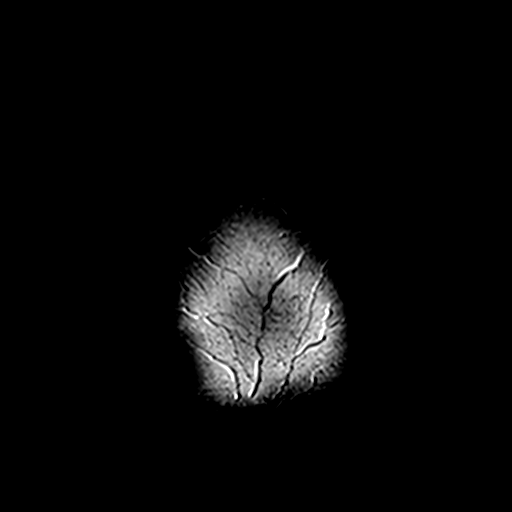

[Series 10: t2_tse_tra_512 · axial · 5.0mm · 0.60mm/px · z∈[+7,+133]mm · 2 of 23 slices shown]
[im 1/23]
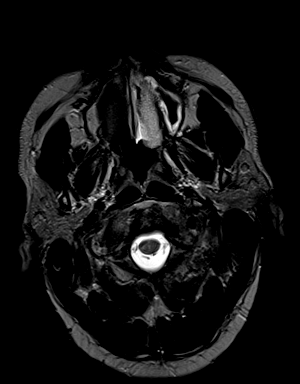
[im 23/23]
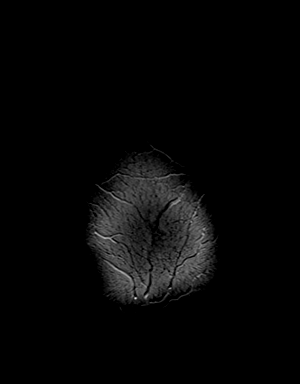

[Series 11: t1_mpr_tra · axial · 2.0mm · 0.45mm/px · z∈[-3,+142]mm · 6 of 80 slices shown]
[im 1/80]
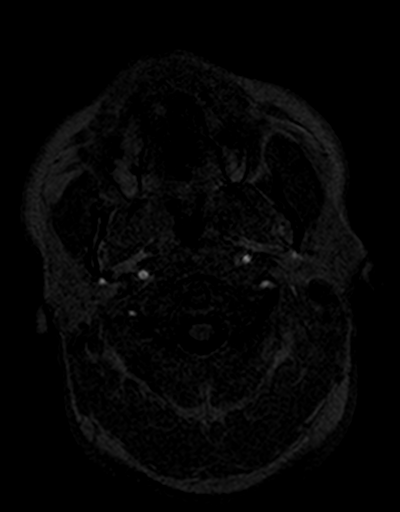
[im 16/80]
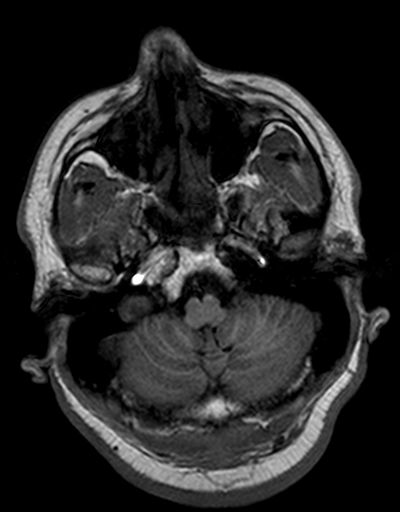
[im 32/80]
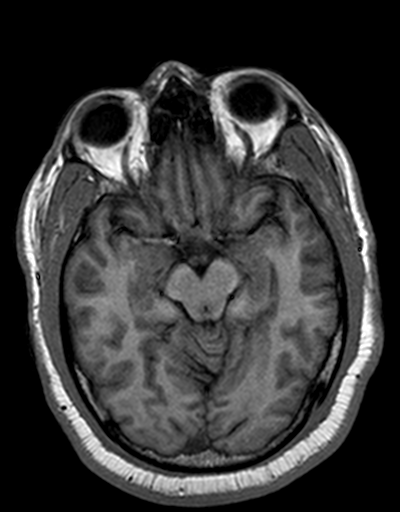
[im 48/80]
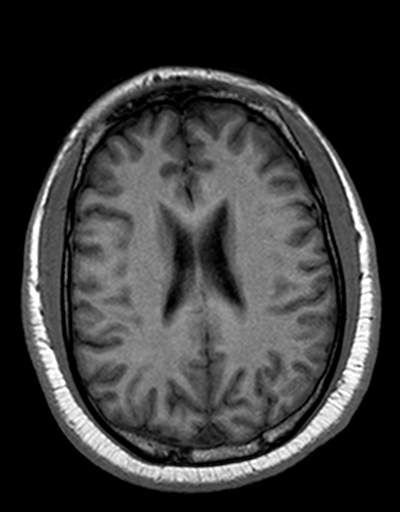
[im 64/80]
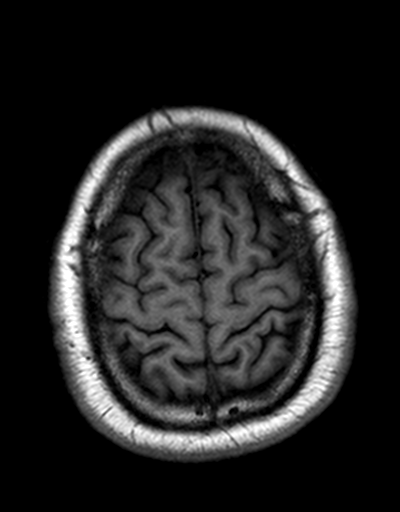
[im 80/80]
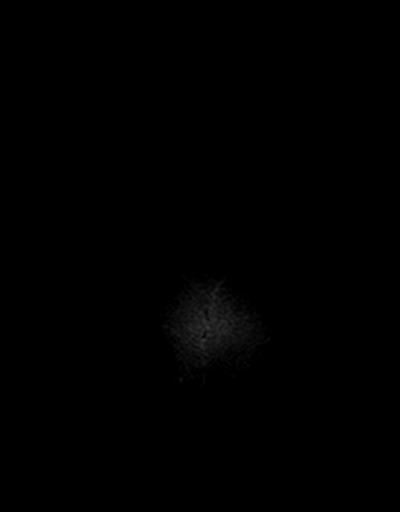

[Series 12: T2 · coronal · 5.0mm · 0.45mm/px · 2 of 28 slices shown]
[im 1/28]
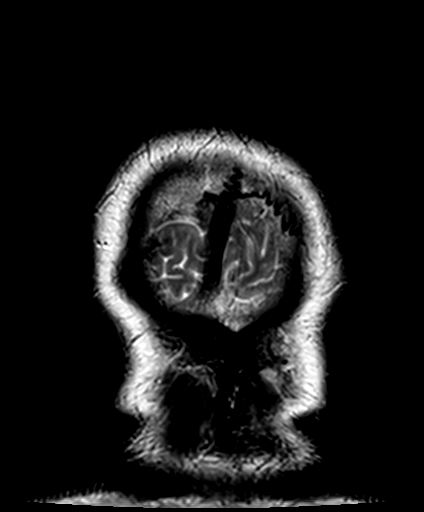
[im 28/28]
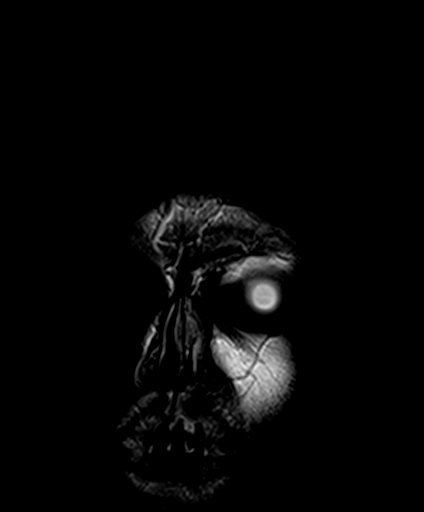

[Series 13: post cor · coronal · 5.0mm · 0.72mm/px · 2 of 28 slices shown]
[im 1/28]
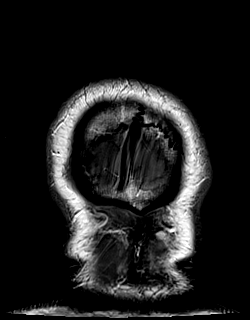
[im 28/28]
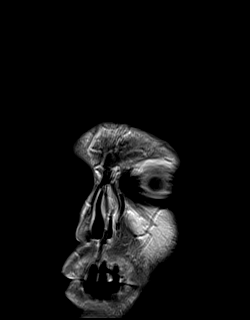

[Series 14: post t1_mpr_tra · axial · 2.0mm · 0.45mm/px · z∈[-3,+25]mm · 2 of 80 slices shown]
[im 1/80]
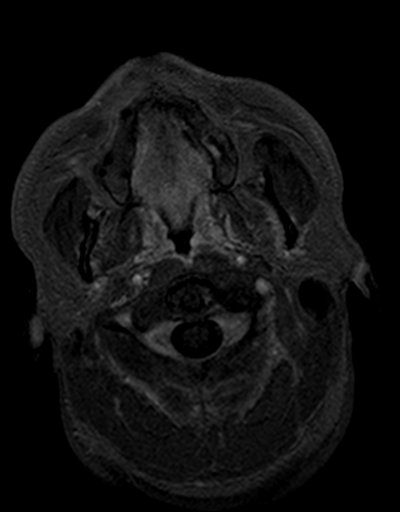
[im 16/80]
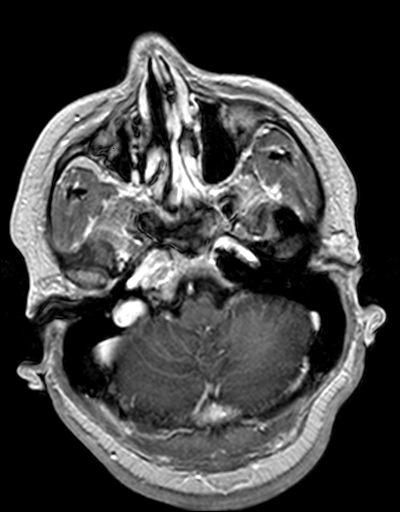

[44 of 48 positions shown; findings below may reference images not displayed]

FINDINGS: Calvarium and upper cervical spine: No focal marrow signal
abnormality. Symmetric skullbase foramina.

Orbits: Right enophthalmos from maxillary sinus atelectasis.

Sinuses and Mastoids: Pansinus moderate mucosal thickening with
right maxillary sinus atelectasis. Mastoid and middle ears are
clear.

Brain: No acute abnormality such as acute infarct, hemorrhage,
hydrocephalus, or mass lesion. No evidence of large vessel
occlusion.

Few T2 and FLAIR hyperintense foci in the bilateral cerebral white
matter, acceptable for age and consistent with remote nonspecific
insult.

No abnormal intracranial enhancement. Prominent but symmetric and
likely within normal limits choroidal vessels in the lateral
ventricles.
IMPRESSION: 1. No intracranial finding to explain headache.
2. Chronic moderate sinusitis. Right maxillary sinus atelectasis
causes mild right enophthalmos.

## 2017-03-28 ENCOUNTER — Ambulatory Visit (INDEPENDENT_AMBULATORY_CARE_PROVIDER_SITE_OTHER): Payer: PRIVATE HEALTH INSURANCE

## 2017-03-28 ENCOUNTER — Ambulatory Visit (INDEPENDENT_AMBULATORY_CARE_PROVIDER_SITE_OTHER): Payer: PRIVATE HEALTH INSURANCE | Admitting: Podiatry

## 2017-03-28 ENCOUNTER — Encounter: Payer: Self-pay | Admitting: Podiatry

## 2017-03-28 DIAGNOSIS — M2041 Other hammer toe(s) (acquired), right foot: Secondary | ICD-10-CM

## 2017-03-28 DIAGNOSIS — M779 Enthesopathy, unspecified: Secondary | ICD-10-CM

## 2017-03-28 NOTE — Progress Notes (Signed)
Subjective:   Patient ID: Marcus OfferJeff C Wilkins, male   DOB: 52 y.o.   MRN: [id number]   HPI Patient presents stating she is getting some pain in the right foot and he was concerned that he may have done something after having had surgery in 2017   ROS      Objective:  Physical Exam  Neurovascular status intact with patient's right foot.  Healed well with slight distal lateral change of the second digit right but only mild discomfort in the forefoot when palpated     Assessment:  Inflammatory condition with probable trauma but no indications of acute process     Plan:  H&P condition reviewed and recommended continued wider type shoes and reviewed that if symptoms were to persist may require a more aggressive treatment option

## 2017-08-23 ENCOUNTER — Encounter: Payer: Self-pay | Admitting: Urgent Care

## 2017-08-23 ENCOUNTER — Ambulatory Visit: Payer: PRIVATE HEALTH INSURANCE | Admitting: Urgent Care

## 2017-08-23 VITALS — BP 156/85 | HR 94 | Temp 98.7°F | Resp 17 | Ht 72.0 in | Wt 245.0 lb

## 2017-08-23 DIAGNOSIS — R030 Elevated blood-pressure reading, without diagnosis of hypertension: Secondary | ICD-10-CM

## 2017-08-23 DIAGNOSIS — R0982 Postnasal drip: Secondary | ICD-10-CM

## 2017-08-23 DIAGNOSIS — R05 Cough: Secondary | ICD-10-CM

## 2017-08-23 DIAGNOSIS — J069 Acute upper respiratory infection, unspecified: Secondary | ICD-10-CM | POA: Diagnosis not present

## 2017-08-23 DIAGNOSIS — I10 Essential (primary) hypertension: Secondary | ICD-10-CM

## 2017-08-23 DIAGNOSIS — R059 Cough, unspecified: Secondary | ICD-10-CM

## 2017-08-23 DIAGNOSIS — R070 Pain in throat: Secondary | ICD-10-CM | POA: Diagnosis not present

## 2017-08-23 MED ORDER — HYDROCODONE-HOMATROPINE 5-1.5 MG/5ML PO SYRP: 5.0000 mL | ORAL_SOLUTION | Freq: Every evening | ORAL | 0 refills | Status: DC | PRN

## 2017-08-23 MED ORDER — BENZONATATE 100 MG PO CAPS: 100.0000 mg | ORAL_CAPSULE | Freq: Two times a day (BID) | ORAL | 0 refills | Status: DC | PRN

## 2017-08-23 MED ORDER — CETIRIZINE HCL 10 MG PO TABS: 10.0000 mg | ORAL_TABLET | Freq: Every day | ORAL | 11 refills | Status: DC

## 2017-08-23 NOTE — Patient Instructions (Addendum)
Viral Respiratory Infection A respiratory infection is an illness that affects part of the respiratory system, such as the lungs, nose, or throat. Most respiratory infections are caused by either viruses or bacteria. A respiratory infection that is caused by a virus is called a viral respiratory infection. Common types of viral respiratory infections include:  A cold.  The flu (influenza).  A respiratory syncytial virus (RSV) infection.  How do I know if I have a viral respiratory infection? Most viral respiratory infections cause:  A stuffy or runny nose.  Yellow or green nasal discharge.  A cough.  Sneezing.  Fatigue.  Achy muscles.  A sore throat.  Sweating or chills.  A fever.  A headache.  How are viral respiratory infections treated? If influenza is diagnosed early, it may be treated with an antiviral medicine that shortens the length of time a person has symptoms. Symptoms of viral respiratory infections may be treated with over-the-counter and prescription medicines, such as:  Expectorants. These make it easier to cough up mucus.  Decongestant nasal sprays.  Health care providers do not prescribe antibiotic medicines for viral infections. This is because antibiotics are designed to kill bacteria. They have no effect on viruses. How do I know if I should stay home from work or school? To avoid exposing others to your respiratory infection, stay home if you have:  A fever.  A persistent cough.  A sore throat.  A runny nose.  Sneezing.  Muscles aches.  Headaches.  Fatigue.  Weakness.  Chills.  Sweating.  Nausea.  Follow these instructions at home:  Rest as much as possible.  Take over-the-counter and prescription medicines only as told by your health care provider.  Drink enough fluid to keep your urine clear or pale yellow. This helps prevent dehydration and helps loosen up mucus.  Gargle with a salt-water mixture 3-4 times per day or  as needed. To make a salt-water mixture, completely dissolve -1 tsp of salt in 1 cup of warm water.  Use nose drops made from salt water to ease congestion and soften raw skin around your nose.  Do not drink alcohol.  Do not use tobacco products, including cigarettes, chewing tobacco, and e-cigarettes. If you need help quitting, ask your health care provider. Contact a health care provider if:  Your symptoms last for 10 days or longer.  Your symptoms get worse over time.  You have a fever.  You have severe sinus pain in your face or forehead.  The glands in your jaw or neck become very swollen. Get help right away if:  You feel pain or pressure in your chest.  You have shortness of breath.  You faint or feel like you will faint.  You have severe and persistent vomiting.  You feel confused or disoriented. This information is not intended to replace advice given to you by your health care provider. Make sure you discuss any questions you have with your health care provider. Document Released: 12/20/2004 Document Revised: 08/18/2015 Document Reviewed: 08/18/2014 Elsevier Interactive Patient Education  2018 Elsevier Inc.     IF you received an x-ray today, you will receive an invoice from [facility]. Please contact [facility] at [phone] with questions or concerns regarding your invoice.   IF you received labwork today, you will receive an invoice from [facility]. Please contact [facility] at [phone] with questions or concerns regarding your invoice.   Our billing staff will not be able to assist you with questions regarding bills from   these companies.  You will be contacted with the lab results as soon as they are available. The fastest way to get your results is to activate your My Chart account. Instructions are located on the last page of this paperwork. If you have not heard from us regarding the results in 2 weeks, please contact this office.       

## 2017-08-23 NOTE — Progress Notes (Signed)
    MRN: [id number] DOB: 10/23/1965  Subjective:   Marcus Wilkins is a 52 y.o. male presenting for 1 week history of productive cough, post-nasal drainage, subjective fever, sinus congestion, scratchy and sore throat. Reports heavy breathing, some shob mostly when he coughs. Has tried Mucinex, Education officer, communityAlka-seltzer. Denies sinus pain, ear pain, chest pain, n/v, abdominal pain. Denies smoking cigarettes. Admits history of allergies, does not take anything for this but is supposed to be on Flonase, Zyrtec. Denies history of asthma.  Marcus Wilkins is not currently taking any medications.  Also has No Known Allergies.  Marcus Wilkins  has a past medical history of Abdominal pain, Allergy, ELECTROCARDIOGRAM, ABNORMAL (12/28/2008), GERD (gastroesophageal reflux disease), H. pylori infection, Osteoarthritis, Plantar fasciitis, Post-operative nausea and vomiting, and Sinusitis, chronic (12/22/2014). Also  has a past surgical history that includes Knee arthroscopy w/ meniscal repair and Wisdom tooth extraction.  Objective:   Vitals: BP (!) 156/85   Pulse 94   Temp 98.7 F (37.1 C) (Oral)   Resp 17   Ht 6' (1.829 m)   Wt 245 lb (111.1 kg)   SpO2 98%   BMI 33.23 kg/m   BP Readings from Last 3 Encounters:  08/23/17 (!) 156/85  10/16/16 118/82  09/14/16 (!) 132/96   Physical Exam  Constitutional: He is oriented to person, place, and time. He appears well-developed and well-nourished.  HENT:  Right Ear: Tympanic membrane normal.  Left Ear: Tympanic membrane normal.  Nose: Mucosal edema present. No rhinorrhea or sinus tenderness.  Mouth/Throat: No oropharyngeal exudate or posterior oropharyngeal erythema.  Post-nasal drainage.  Eyes: No scleral icterus.  Cardiovascular: Normal rate, regular rhythm and intact distal pulses. Exam reveals no gallop and no friction rub.  No murmur heard. Pulmonary/Chest: No respiratory distress. He has no wheezes. He has no rales.  Neurological: He is alert and oriented to person, place, and  time.  Skin: Skin is warm and dry.  Psychiatric: He has a normal mood and affect.   Assessment and Plan :   Viral upper respiratory tract infection  Post-nasal drainage  Cough  Throat pain  Essential hypertension  Elevated blood pressure reading  Will manage supportively for viral type illness. Counseled patient on potential for adverse effects with medications prescribed today, patient verbalized understanding.  Patient is to follow-up with his PCP regarding his high blood pressure, history of diabetes and hyperlipidemia.  He is not currently on medications right now and counseled that he may want to revisit this with his PCP.  Patient verbalized understanding.  Wallis BambergMario Elvenia Godden, PA-C [facility] [phone] 08/23/2017  11:45 AM

## 2017-08-29 ENCOUNTER — Encounter: Payer: Self-pay | Admitting: Family Medicine

## 2017-08-29 ENCOUNTER — Ambulatory Visit: Payer: POS | Admitting: Family Medicine

## 2017-08-29 VITALS — BP 120/80 | HR 94 | Temp 98.4°F | Ht 72.0 in | Wt 240.8 lb

## 2017-08-29 DIAGNOSIS — E1169 Type 2 diabetes mellitus with other specified complication: Secondary | ICD-10-CM | POA: Diagnosis not present

## 2017-08-29 DIAGNOSIS — I10 Essential (primary) hypertension: Secondary | ICD-10-CM

## 2017-08-29 DIAGNOSIS — E119 Type 2 diabetes mellitus without complications: Secondary | ICD-10-CM

## 2017-08-29 DIAGNOSIS — E1159 Type 2 diabetes mellitus with other circulatory complications: Secondary | ICD-10-CM | POA: Diagnosis not present

## 2017-08-29 DIAGNOSIS — E785 Hyperlipidemia, unspecified: Secondary | ICD-10-CM

## 2017-08-29 DIAGNOSIS — R05 Cough: Secondary | ICD-10-CM | POA: Diagnosis not present

## 2017-08-29 DIAGNOSIS — R059 Cough, unspecified: Secondary | ICD-10-CM

## 2017-08-29 NOTE — Patient Instructions (Signed)
BEFORE YOU LEAVE: -xray sheet -labs -follow up: 3 months  Go get the xray.  Schedule your diabetic eye exam.  Continue the Losartan.  We have ordered labs or studies at this visit. It can take up to 1-2 weeks for results and processing. IF results require follow up or explanation, we will call you with instructions. Clinically stable results will be released to your [facility]. If you have not heard from us or cannot find your results in [facility] in 2 weeks please contact our office at [phone].  If you are not yet signed up for [facility], please consider signing up.   We recommend the following healthy lifestyle for LIFE: 1) Small portions. But, make sure to get regular (at least 3 per day), healthy meals and small healthy snacks if needed.  2) Eat a healthy clean diet.   TRY TO EAT: -at least 5-7 servings of low sugar, colorful, and nutrient rich vegetables per day (not corn, potatoes or bananas.) -berries are the best choice if you wish to eat fruit (only eat small amounts if trying to reduce weight)  -lean meets (fish, white meat of chicken or [place]urkey) -vegan proteins for some meals - beans or tofu, whole grains, nuts and seeds -Replace bad fats with good fats - good fats include: fish, nuts and seeds, canola oil, olive oil -small amounts of low fat or non fat dairy -small amounts of100 % whole grains - check the lables -drink plenty of water  AVOID: -SUGAR, sweets, anything with added sugar, corn syrup or sweeteners - must read labels as even foods advertised as "healthy" often are loaded with sugar -if you must have a sweetener, small amounts of stevia may be best -sweetened beverages and artificially sweetened beverages -simple starches (rice, bread, potatoes, pasta, chips, etc - small amounts of 100% whole grains are ok) -red meat, pork, butter -fried foods, fast food, processed food, excessive dairy, eggs and coconut.  3)Get at least 150 minutes of sweaty aerobic exercise  per week.  4)Reduce stress - consider counseling, meditation and relaxation to balance other aspects of your life.

## 2017-08-29 NOTE — Progress Notes (Signed)
HPI:  Using dictation device. Unfortunately this device frequently misinterprets words/phrases.  Marcus Wilkins is a pleasant 52 y.o. here for follow up. Chronic medical problems summarized below were reviewed for changes and stability and were updated as needed below. Hx of poor compliance and refused pharmacological tx for his conditions. Poor diet and sedentary in the past. Agreed to start losartan finally, then stopped.reports he started it back up after and urgent care visit recently when blood pressure was elevated. He had UR symptoms and cough that started about 3 weeks ago - seen in urgent care recently and given tessalon and cough syrup and doing a little better but still has cough and "chest congestion." denies fevers, cp, sig SOB. Has not been treating the diabetes.   Follow up Diaetes: -last Hgba1c 7.1 -refused pharmacological treatment, podiatrist started NSAID -poor diet, no exercise -due for foot exam and eye exam -agrees to pneumonia vaccine  HTN/Obesity/dyslipidemia: -poor diet and no exercise -refused medications in the past -agreed to losartan 09/14/16 after repeat discussion risks/benefits    ROS: See pertinent positives and negatives per HPI.  Past Medical History:  Diagnosis Date  . Abdominal pain    right side radiating to left side  . Allergy   . ELECTROCARDIOGRAM, ABNORMAL 12/28/2008   Qualifier: Diagnosis of  By: Eden Emms, MD, Harrington Challenger   . GERD (gastroesophageal reflux disease)    hx ulcer  . H. pylori infection   . Osteoarthritis   . Plantar fasciitis   . Post-operative nausea and vomiting    1 time  . Sinusitis, chronic 12/22/2014   -hx sinus surgery, sees ENT     Past Surgical History:  Procedure Laterality Date  . KNEE ARTHROSCOPY W/ MENISCAL REPAIR     bilateral  . WISDOM TOOTH EXTRACTION      Family History  Problem Relation Age of Onset  . Stroke Mother   . Heart disease Mother        valvular  . Colon cancer Mother   .  Stroke Father   . Diabetes Father   . Hypertension Father     SOCIAL HX: see hpi   Current Outpatient Medications:  .  benzonatate (TESSALON) 100 MG capsule, Take 1 capsule (100 mg total) by mouth 2 (two) times daily as needed for cough., Disp: 60 capsule, Rfl: 0 .  cetirizine (ZYRTEC) 10 MG tablet, Take 1 tablet (10 mg total) by mouth daily., Disp: 30 tablet, Rfl: 11 .  HYDROcodone-homatropine (HYCODAN) 5-1.5 MG/5ML syrup, Take 5 mLs by mouth at bedtime as needed., Disp: 100 mL, Rfl: 0 .  losartan (COZAAR) 50 MG tablet, Take 1 tablet (50 mg total) by mouth daily., Disp: 90 tablet, Rfl: 3  EXAM:  Vitals:   08/29/17 1510  BP: 120/80  Pulse: 94  Temp: 98.4 F (36.9 C)  SpO2: 97%    Body mass index is 32.66 kg/m.  GENERAL: vitals reviewed and listed above, alert, oriented, appears well hydrated and in no acute distress  HEENT: atraumatic, conjunttiva clear, no obvious abnormalities on inspection of external nose and ears  NECK: no obvious masses on inspection  LUNGS: clear to auscultation bilaterally, no wheezes, rales or rhonchi, good air movement  CV: HRRR, no peripheral edema  MS: moves all extremities without noticeable abnormality  PSYCH: pleasant and cooperative, no obvious depression or anxiety  ASSESSMENT AND PLAN:  Discussed the following assessment and plan:  Hypertension associated with diabetes (HCC) - Plan: CBC, CANCELED: Basic metabolic panel  Hyperlipidemia  associated with type 2 diabetes mellitus (HCC) - Plan: HDL cholesterol, Cholesterol, total  Diabetes mellitus without complication (HCC) - Plan: Hemoglobin A1c, Comprehensive metabolic panel  Cough - Plan: DG Chest 2 View  -lungs sound good but will get CXR to exclude LRI - suspect post viral cough and tessalon/cough medication is helping - advised to follow up if not resolving entirely over the next week or so -labs per orders -advised to stay on the BP medication for life unless significant  lifestyle changes and low blood pressure -lifestyle recs, implication diseases, treatment options discussed at lengths, discuss adding metformin, he opted to wait on labs -Patient advised to return or notify a doctor immediately if symptoms worsen or persist or new concerns arise.  Patient Instructions  BEFORE YOU LEAVE: -xray sheet -labs -follow up: 3 months  Go get the xray.  Schedule your diabetic eye exam.  Continue the Losartan.  We have ordered labs or studies at this visit. It can take up to 1-2 weeks for results and processing. IF results require follow up or explanation, we will call you with instructions. Clinically stable results will be released to your [facility]. If you have not heard from us or cannot find your results in [facility] in 2 weeks please contact our office at [phone].  If you are not yet signed up for [facility], please consider signing up.   We recommend the following healthy lifestyle for LIFE: 1) Small portions. But, make sure to get regular (at least 3 per day), healthy meals and small healthy snacks if needed.  2) Eat a healthy clean diet.   TRY TO EAT: -at least 5-7 servings of low sugar, colorful, and nutrient rich vegetables per day (not corn, potatoes or bananas.) -berries are the best choice if you wish to eat fruit (only eat small amounts if trying to reduce weight)  -lean meets (fish, white meat of chicken or [place]key) -vegan proteins for some meals - beans or tofu, whole grains, nuts and seeds -Replace bad fats with good fats - good fats include: fish, nuts and seeds, canola oil, olive oil -small amounts of low fat or non fat dairy -small amounts of100 % whole grains - check the lables -drink plenty of water  AVOID: -SUGAR, sweets, anything with added sugar, corn syrup or sweeteners - must read labels as even foods advertised as "healthy" often are loaded with sugar -if you must have a sweetener, small amounts of stevia may be best -sweetened  beverages and artificially sweetened beverages -simple starches (rice, bread, potatoes, pasta, chips, etc - small amounts of 100% whole grains are ok) -red meat, pork, butter -fried foods, fast food, processed food, excessive dairy, eggs and coconut.  3)Get at least 150 minutes of sweaty aerobic exercise per week.  4)Reduce stress - consider counseling, meditation and relaxation to balance other aspects of your life.         Terressa KoyanagiHannah R Kim, DO

## 2017-08-30 ENCOUNTER — Ambulatory Visit (INDEPENDENT_AMBULATORY_CARE_PROVIDER_SITE_OTHER)
Admission: RE | Admit: 2017-08-30 | Discharge: 2017-08-30 | Disposition: A | Discharge status: Home / Self Care | Payer: POS | Source: Ambulatory Visit | Attending: Family Medicine | Admitting: Family Medicine

## 2017-08-30 DIAGNOSIS — R05 Cough: Secondary | ICD-10-CM | POA: Diagnosis not present

## 2017-08-30 DIAGNOSIS — R059 Cough, unspecified: Secondary | ICD-10-CM

## 2017-08-30 LAB — COMPREHENSIVE METABOLIC PANEL
ALBUMIN: 4 g/dL (ref 3.5–5.2)
ALT: 19 U/L (ref 0–53)
AST: 20 U/L (ref 0–37)
Alkaline Phosphatase: 88 U/L (ref 39–117)
BILIRUBIN TOTAL: 0.9 mg/dL (ref 0.2–1.2)
BUN: 11 mg/dL (ref 6–23)
CALCIUM: 9.3 mg/dL (ref 8.4–10.5)
CHLORIDE: 103 meq/L (ref 96–112)
CO2: 26 mEq/L (ref 19–32)
CREATININE: 1.05 mg/dL (ref 0.40–1.50)
GFR: 94.59 mL/min (ref >60.00)
Glucose, Bld: 102 mg/dL — ABNORMAL HIGH (ref 70–99)
Potassium: 4.1 mEq/L (ref 3.5–5.1)
Sodium: 140 mEq/L (ref 135–145)
Total Protein: 7.5 g/dL (ref 6.0–8.3)

## 2017-08-30 LAB — HDL CHOLESTEROL: HDL: 30.3 mg/dL — AB (ref >39.00)

## 2017-08-30 LAB — HEMOGLOBIN A1C: Hgb A1c MFr Bld: 6.7 % — ABNORMAL HIGH (ref 4.6–6.5)

## 2017-08-30 LAB — CBC
HCT: 45.7 % (ref 39.0–52.0)
Hemoglobin: 15.3 g/dL (ref 13.0–17.0)
MCHC: 33.4 g/dL (ref 30.0–36.0)
MCV: 87.6 fl (ref 78.0–100.0)
PLATELETS: 233 10*3/uL (ref 150.0–400.0)
RBC: 5.22 Mil/uL (ref 4.22–5.81)
RDW: 14.8 % (ref 11.5–15.5)
WBC: 7.7 10*3/uL (ref 4.0–10.5)

## 2017-08-30 LAB — CHOLESTEROL, TOTAL: Cholesterol: 155 mg/dL (ref 0–200)

## 2017-09-14 ENCOUNTER — Other Ambulatory Visit: Payer: Self-pay | Admitting: Family Medicine

## 2017-09-14 DIAGNOSIS — E1159 Type 2 diabetes mellitus with other circulatory complications: Secondary | ICD-10-CM

## 2017-09-14 DIAGNOSIS — I10 Essential (primary) hypertension: Principal | ICD-10-CM

## 2017-11-28 ENCOUNTER — Encounter: Payer: Self-pay | Admitting: Family Medicine

## 2017-11-28 ENCOUNTER — Ambulatory Visit: Payer: POS | Admitting: Family Medicine

## 2017-11-28 VITALS — BP 136/86 | HR 97 | Temp 98.6°F | Ht 72.0 in | Wt 231.7 lb

## 2017-11-28 DIAGNOSIS — E785 Hyperlipidemia, unspecified: Secondary | ICD-10-CM

## 2017-11-28 DIAGNOSIS — E1169 Type 2 diabetes mellitus with other specified complication: Secondary | ICD-10-CM

## 2017-11-28 DIAGNOSIS — E1159 Type 2 diabetes mellitus with other circulatory complications: Secondary | ICD-10-CM

## 2017-11-28 DIAGNOSIS — Z23 Encounter for immunization: Secondary | ICD-10-CM | POA: Diagnosis not present

## 2017-11-28 DIAGNOSIS — E119 Type 2 diabetes mellitus without complications: Secondary | ICD-10-CM | POA: Diagnosis not present

## 2017-11-28 DIAGNOSIS — I10 Essential (primary) hypertension: Secondary | ICD-10-CM

## 2017-11-28 DIAGNOSIS — I152 Hypertension secondary to endocrine disorders: Secondary | ICD-10-CM

## 2017-11-28 NOTE — Addendum Note (Signed)
Addended by: Johnella Moloney on: 11/28/2017 02:08 PM   Modules accepted: Orders

## 2017-11-28 NOTE — Patient Instructions (Signed)
BEFORE YOU LEAVE: -flu shot today  -follow up: 3 months - make sure you take your blood pressure medication that morning before coming!  Take blood pressure medication every morning.  Continue a healthy diet and regular exercise.

## 2017-11-28 NOTE — Progress Notes (Signed)
HPI:  Using dictation device. Unfortunately this device frequently misinterprets words/phrases.  Marcus Wilkins is a pleasant 52 y.o. here for follow up. Chronic medical problems summarized below were reviewed for changes. Hx of poor compliance and declined recommendations in the past. Reports he has been much better about his diet and exercise and is happy with the weight reduction. Feels better. Taking BP med most day, but did not take today and had stress today. Denies CP, SOB, DOE, treatment intolerance or new symptoms. Due for eye exam, foot exam, flu shot  Follow up Diaetes: -last Hgba1c 6.7 6/19 -refusedpharmacologicaltreatment, podiatrist started NSAID -poor diet, no exercise -due for foot exam and eye exam -agrees to pneumonia vaccine  HTN/Obesity/dyslipidemia: -hx of poor diet and no exercise -refused medications in the past -agreed to losartan 09/14/16 after repeat discussion risks/benefits -wt 240 6/19 --> 9/19 231 ROS: See pertinent positives and negatives per HPI.  Past Medical History:  Diagnosis Date  . Abdominal pain    right side radiating to left side  . Allergy   . ELECTROCARDIOGRAM, ABNORMAL 12/28/2008   Qualifier: Diagnosis of  By: Eden Emms, MD, Harrington Challenger   . GERD (gastroesophageal reflux disease)    hx ulcer  . H. pylori infection   . Osteoarthritis   . Plantar fasciitis   . Post-operative nausea and vomiting    1 time  . Sinusitis, chronic 12/22/2014   -hx sinus surgery, sees ENT     Past Surgical History:  Procedure Laterality Date  . KNEE ARTHROSCOPY W/ MENISCAL REPAIR     bilateral  . WISDOM TOOTH EXTRACTION      Family History  Problem Relation Age of Onset  . Stroke Mother   . Heart disease Mother        valvular  . Colon cancer Mother   . Stroke Father   . Diabetes Father   . Hypertension Father     SOCIAL HX: see hpi   Current Outpatient Medications:  .  cetirizine (ZYRTEC) 10 MG tablet, Take 1 tablet (10 mg total)  by mouth daily., Disp: 30 tablet, Rfl: 11 .  losartan (COZAAR) 50 MG tablet, TAKE 1 TABLET(50 MG) BY MOUTH DAILY, Disp: 90 tablet, Rfl: 1  EXAM:  Vitals:   11/28/17 1313  BP: 136/86  Pulse: 97  Temp: 98.6 F (37 C)    Body mass index is 31.42 kg/m.  GENERAL: vitals reviewed and listed above, alert, oriented, appears well hydrated and in no acute distress  HEENT: atraumatic, conjunttiva clear, no obvious abnormalities on inspection of external nose and ears  NECK: no obvious masses on inspection  LUNGS: clear to auscultation bilaterally, no wheezes, rales or rhonchi, good air movement  CV: HRRR, no peripheral edema  MS: moves all extremities without noticeable abnormality  PSYCH: pleasant and cooperative, no obvious depression or anxiety  ASSESSMENT AND PLAN:  Discussed the following assessment and plan:  Hyperlipidemia associated with type 2 diabetes mellitus (HCC)  Hypertension associated with diabetes (HCC)  Diabetes mellitus without complication (HCC)  -bp a little up but did not take med, recheck at follow up -foot exam done -flu shot today per his preference -lifestyle recs - congratulated on changes -follow up 3 months, sooner as needed  Patient Instructions  BEFORE YOU LEAVE: -flu shot today  -follow up: 3 months - make sure you take your blood pressure medication that morning before coming!  Take blood pressure medication every morning.  Continue a healthy diet and regular exercise.  Muna Demers R Laronn Devonshire, DO   

## 2018-01-17 ENCOUNTER — Telehealth: Payer: Self-pay | Admitting: *Deleted

## 2018-01-17 NOTE — Telephone Encounter (Signed)
Dr Selena Batten received forms from [facility] to be completed for the pt.  She stated the pt needs an appt prior to the forms being completed.  I called the pt and was unable to leave a message due to the voicemail being full.

## 2018-02-27 ENCOUNTER — Encounter: Payer: Self-pay | Admitting: Family Medicine

## 2018-02-27 ENCOUNTER — Ambulatory Visit: Payer: POS | Admitting: Family Medicine

## 2018-02-27 VITALS — BP 118/78 | HR 72 | Temp 97.9°F | Ht 72.0 in | Wt 234.0 lb

## 2018-02-27 DIAGNOSIS — E669 Obesity, unspecified: Secondary | ICD-10-CM

## 2018-02-27 DIAGNOSIS — E1159 Type 2 diabetes mellitus with other circulatory complications: Secondary | ICD-10-CM | POA: Diagnosis not present

## 2018-02-27 DIAGNOSIS — I10 Essential (primary) hypertension: Secondary | ICD-10-CM | POA: Diagnosis not present

## 2018-02-27 DIAGNOSIS — E119 Type 2 diabetes mellitus without complications: Secondary | ICD-10-CM

## 2018-02-27 DIAGNOSIS — E1169 Type 2 diabetes mellitus with other specified complication: Secondary | ICD-10-CM | POA: Diagnosis not present

## 2018-02-27 DIAGNOSIS — E785 Hyperlipidemia, unspecified: Secondary | ICD-10-CM | POA: Diagnosis not present

## 2018-02-27 LAB — BASIC METABOLIC PANEL
BUN: 13 mg/dL (ref 6–23)
CHLORIDE: 102 meq/L (ref 96–112)
CO2: 31 meq/L (ref 19–32)
Calcium: 9.5 mg/dL (ref 8.4–10.5)
Creatinine, Ser: 0.93 mg/dL (ref 0.40–1.50)
GFR: 108.61 mL/min (ref >60.00)
Glucose, Bld: 86 mg/dL (ref 70–99)
POTASSIUM: 4.3 meq/L (ref 3.5–5.1)
Sodium: 140 mEq/L (ref 135–145)

## 2018-02-27 LAB — CBC
HEMATOCRIT: 48.8 % (ref 39.0–52.0)
HEMOGLOBIN: 16 g/dL (ref 13.0–17.0)
MCHC: 32.8 g/dL (ref 30.0–36.0)
MCV: 87 fl (ref 78.0–100.0)
Platelets: 199 10*3/uL (ref 150.0–400.0)
RBC: 5.61 Mil/uL (ref 4.22–5.81)
RDW: 15 % (ref 11.5–15.5)
WBC: 8.8 10*3/uL (ref 4.0–10.5)

## 2018-02-27 LAB — HEMOGLOBIN A1C: Hgb A1c MFr Bld: 6.2 % (ref 4.6–6.5)

## 2018-02-27 MED ORDER — ROSUVASTATIN CALCIUM 5 MG PO TABS: 5.0000 mg | ORAL_TABLET | Freq: Every day | ORAL | 3 refills | Status: DC

## 2018-02-27 NOTE — Progress Notes (Signed)
HPI:  Using dictation device. Unfortunately this device frequently misinterprets words/phrases.  Marcus Wilkins is a pleasant 52 y.o. here for follow up. Chronic medical problems summarized below were reviewed for changes and stability and were updated as needed below. These issues and their treatment remain stable for the most part.  Not exercising, diet not as good. Plans to get back on track. Denies CP, SOB, DOE, treatment intolerance or new symptoms. Due for labs, eye exam.  Diabetes: -last Hgba1c 6.7 6/19 -poor diet, no exercise -on arb  HTN/Obesity/dyslipidemia: -hx of poor diet and no exercise -refused medications in the past -agreed to losartan 09/14/16 after repeat discussion risks/benefits -wt 240 6/19 --> 9/19 231 -->234 12/19  ROS: See pertinent positives and negatives per HPI.  Past Medical History:  Diagnosis Date  . Abdominal pain    right side radiating to left side  . Allergy   . ELECTROCARDIOGRAM, ABNORMAL 12/28/2008   Qualifier: Diagnosis of  By: Eden Emms, MD, Harrington Challenger   . GERD (gastroesophageal reflux disease)    hx ulcer  . H. pylori infection   . Osteoarthritis   . Plantar fasciitis   . Post-operative nausea and vomiting    1 time  . Sinusitis, chronic 12/22/2014   -hx sinus surgery, sees ENT     Past Surgical History:  Procedure Laterality Date  . KNEE ARTHROSCOPY W/ MENISCAL REPAIR     bilateral  . WISDOM TOOTH EXTRACTION      Family History  Problem Relation Age of Onset  . Stroke Mother   . Heart disease Mother        valvular  . Colon cancer Mother   . Stroke Father   . Diabetes Father   . Hypertension Father     SOCIAL HX: see hpi   Current Outpatient Medications:  .  cetirizine (ZYRTEC) 10 MG tablet, Take 1 tablet (10 mg total) by mouth daily., Disp: 30 tablet, Rfl: 11 .  losartan (COZAAR) 50 MG tablet, TAKE 1 TABLET(50 MG) BY MOUTH DAILY, Disp: 90 tablet, Rfl: 1  EXAM:  Vitals:   02/27/18 1320  BP: 118/78   Pulse: 72  Temp: 97.9 F (36.6 C)    Body mass index is 31.74 kg/m.  GENERAL: vitals reviewed and listed above, alert, oriented, appears well hydrated and in no acute distress  HEENT: atraumatic, conjunttiva clear, no obvious abnormalities on inspection of external nose and ears  NECK: no obvious masses on inspection  LUNGS: clear to auscultation bilaterally, no wheezes, rales or rhonchi, good air movement  CV: HRRR, no peripheral edema  MS: moves all extremities without noticeable abnormality  PSYCH: pleasant and cooperative, no obvious depression or anxiety  ASSESSMENT AND PLAN:  Discussed the following assessment and plan:  Diabetes mellitus without complication (HCC)  Hyperlipidemia associated with type 2 diabetes mellitus (HCC)  Hypertension associated with diabetes (HCC)  Obesity (BMI 30-39.9)  -lifestyle recs -he opted for getting back on lifestyle for the BP -advised statin again given diabetes - he finally agreed to try low dose after discussion risks/benefits, rx sent -forms for work/insurance to complete and give to assistant -labs per orders -advised eye exam, he plans to check with his insurance, he agrees to schedule -follow up 3-4 months   Patient Instructions  BEFORE YOU LEAVE: -labs -schedule eye exam -follow up: 3-4 months  Start the crestor low dose and take once daily.  We have ordered labs or studies at this visit. It can take up to 1-2  weeks for results and processing. IF results require follow up or explanation, we will call you with instructions. Clinically stable results will be released to [facility]. If you have not heard from us or cannot find your results in [facility] in 2 weeks please contact our office at [phone].  If you are not yet signed up for [facility], please consider signing up.   We recommend the following healthy lifestyle for LIFE: 1) Small portions. But, make sure to get regular (at least 3 per day), healthy meals  and small healthy snacks if needed.  2) Eat a healthy clean diet.   TRY TO EAT: -at least 5-7 servings of low sugar, colorful, and nutrient rich vegetables per day (not corn, potatoes or bananas.) -berries are the best choice if you wish to eat fruit (only eat small amounts if trying to reduce weight)  -lean meets (fish, white meat of chicken or [place]) -vegan proteins for some meals - beans or tofu, whole grains, nuts and seeds -Replace bad fats with good fats - good fats include: fish, nuts and seeds, canola oil, olive oil -small amounts of low fat or non fat dairy -small amounts of100 % whole grains - check the lables -drink plenty of water  AVOID: -SUGAR, sweets, anything with added sugar, corn syrup or sweeteners - must read labels as even foods advertised as "healthy" often are loaded with sugar -if you must have a sweetener, small amounts of stevia may be best -sweetened beverages and artificially sweetened beverages -simple starches (rice, bread, potatoes, pasta, chips, etc - small amounts of 100% whole grains are ok) -red meat, pork, butter -fried foods, fast food, processed food, excessive dairy, eggs and coconut.  3)Get at least 150 minutes of sweaty aerobic exercise per week.  4)Reduce stress - consider counseling, meditation and relaxation to balance other aspects of your life.          Terressa KoyanagiHannah R Maysoon Lozada, DO

## 2018-02-27 NOTE — Patient Instructions (Signed)
BEFORE YOU LEAVE: -labs -schedule eye exam -follow up: 3-4 months  Start the crestor low dose and take once daily.  We have ordered labs or studies at this visit. It can take up to 1-2 weeks for results and processing. IF results require follow up or explanation, we will call you with instructions. Clinically stable results will be released to your [facility]. If you have not heard from us or cannot find your results in [facility] in 2 weeks please contact our office at [phone].  If you are not yet signed up for [facility], please consider signing up.   We recommend the following healthy lifestyle for LIFE: 1) Small portions. But, make sure to get regular (at least 3 per day), healthy meals and small healthy snacks if needed.  2) Eat a healthy clean diet.   TRY TO EAT: -at least 5-7 servings of low sugar, colorful, and nutrient rich vegetables per day (not corn, potatoes or bananas.) -berries are the best choice if you wish to eat fruit (only eat small amounts if trying to reduce weight)  -lean meets (fish, white meat of chicken or [place]) -vegan proteins for some meals - beans or tofu, whole grains, nuts and seeds -Replace bad fats with good fats - good fats include: fish, nuts and seeds, canola oil, olive oil -small amounts of low fat or non fat dairy -small amounts of100 % whole grains - check the lables -drink plenty of water  AVOID: -SUGAR, sweets, anything with added sugar, corn syrup or sweeteners - must read labels as even foods advertised as "healthy" often are loaded with sugar -if you must have a sweetener, small amounts of stevia may be best -sweetened beverages and artificially sweetened beverages -simple starches (rice, bread, potatoes, pasta, chips, etc - small amounts of 100% whole grains are ok) -red meat, pork, butter -fried foods, fast food, processed food, excessive dairy, eggs and coconut.  3)Get at least 150 minutes of sweaty aerobic exercise per week.  4)Reduce  stress - consider counseling, meditation and relaxation to balance other aspects of your life.

## 2018-03-02 NOTE — Progress Notes (Signed)
[facility] and attached OV note. Ronnald CollumJo Anne, please copy and scan and give to pt along with printed out OV notes attached for him to return. Thanks.

## 2018-03-03 NOTE — Progress Notes (Signed)
I called the pt and left a detailed message stating the form was completed and left at the front desk for him to pick up, along with office notes. Ronnald CollumJo Anne

## 2018-03-03 NOTE — Progress Notes (Signed)
I called the patient and left a detailed message the forms were completed and left along with the office note at the front desk for him to pick up. Ronnald CollumJo Anne

## 2018-03-15 LAB — HM DIABETES EYE EXAM

## 2018-05-29 ENCOUNTER — Encounter: Payer: Self-pay | Admitting: Family Medicine

## 2018-05-29 ENCOUNTER — Ambulatory Visit: Payer: POS | Admitting: Family Medicine

## 2018-05-29 VITALS — BP 130/88 | HR 84 | Temp 98.3°F | Ht 72.0 in | Wt 240.8 lb

## 2018-05-29 DIAGNOSIS — I10 Essential (primary) hypertension: Secondary | ICD-10-CM

## 2018-05-29 DIAGNOSIS — Z6832 Body mass index (BMI) 32.0-32.9, adult: Secondary | ICD-10-CM | POA: Diagnosis not present

## 2018-05-29 DIAGNOSIS — E1159 Type 2 diabetes mellitus with other circulatory complications: Secondary | ICD-10-CM

## 2018-05-29 DIAGNOSIS — E785 Hyperlipidemia, unspecified: Secondary | ICD-10-CM

## 2018-05-29 DIAGNOSIS — E119 Type 2 diabetes mellitus without complications: Secondary | ICD-10-CM

## 2018-05-29 DIAGNOSIS — E1169 Type 2 diabetes mellitus with other specified complication: Secondary | ICD-10-CM | POA: Diagnosis not present

## 2018-05-29 DIAGNOSIS — I152 Hypertension secondary to endocrine disorders: Secondary | ICD-10-CM

## 2018-05-29 MED ORDER — LOSARTAN POTASSIUM 50 MG PO TABS: 75.0000 mg | ORAL_TABLET | Freq: Every day | ORAL | 1 refills | Status: DC

## 2018-05-29 MED ORDER — ATORVASTATIN CALCIUM 10 MG PO TABS: 10.0000 mg | ORAL_TABLET | Freq: Every day | ORAL | 1 refills | Status: DC

## 2018-05-29 NOTE — Patient Instructions (Signed)
BEFORE YOU LEAVE: -obtain/update eye exam info -follow up: 3 months  INCREASE the losartan to 75mg  (1.5 tablets) daily.  Try the Lipitor for the cholesterol. Start with just 1 day per week for the first week, then 2x per week and so on. Let us know if you do not tolerate it and we will place a referral to the cholesterol specialist.  We have ordered labs or studies at this visit. It can take up to 1-2 weeks for results and processing. IF results require follow up or explanation, we will call you with instructions. Clinically stable results will be released to your [facility]. If you have not heard from [place] or cannot find your results in [facility] in 2 weeks please contact our office at [phone].  If you are not yet signed up for [facility], please consider signing up.   We recommend the following healthy lifestyle for LIFE: 1) Small portions. But, make sure to get regular (at least 3 per day), healthy meals and small healthy snacks if needed.  2) Eat a healthy clean diet.   TRY TO EAT: -at least 5-7 servings of low sugar, colorful, and nutrient rich vegetables per day (not corn, potatoes or bananas.) -berries are the best choice if you wish to eat fruit (only eat small amounts if trying to reduce weight)  -lean meets (fish, white meat of chicken or [place]) -vegan proteins for some meals - beans or tofu, whole grains, nuts and seeds -Replace bad fats with good fats - good fats include: fish, nuts and seeds, canola oil, olive oil -small amounts of low fat or non fat dairy -small amounts of100 % whole grains - check the lables -drink plenty of water  AVOID: -SUGAR, sweets, anything with added sugar, corn syrup or sweeteners - must read labels as even foods advertised as "healthy" often are loaded with sugar -if you must have a sweetener, small amounts of stevia may be best -sweetened beverages and artificially sweetened beverages -simple starches (rice, bread, potatoes, pasta, chips, etc -  small amounts of 100% whole grains are ok) -red meat, pork, butter -fried foods, fast food, processed food, excessive dairy, eggs and coconut.  3)Get at least 150 minutes of sweaty aerobic exercise per week.  4)Reduce stress - consider counseling, meditation and relaxation to balance other aspects of your life.

## 2018-05-29 NOTE — Progress Notes (Signed)
HPI:  Using dictation device. Unfortunately this device frequently misinterprets words/phrases.  Marcus Wilkins is a pleasant 53 y.o. here for follow up. Chronic medical problems summarized below were reviewed for changes and stability and were updated as needed below. These issues and their treatment remain stable for the most part. He reports he only tried a few doses of the crestor because he felt like it caused leg cramps. Feels like his BP is up from stress - mom just got out of the hospital. Denies CP, SOB, DOE, treatment intolerance or new symptoms.  Diabetes: -last Hgba1c6.7 6/19 -poor diet, no exercise -on arb  HTN/Obesity/dyslipidemia: -hx ofpoor diet and no exercise -refused medications in the past, tried crestor but had leg cramps -agreed to losartan 09/14/16 after repeat discussion risks/benefits -wt 240 6/19 --> 9/19231 -->234 12/19 --> 234 3/20  ROS: See pertinent positives and negatives per HPI.  Past Medical History:  Diagnosis Date  . Abdominal pain    right side radiating to left side  . Allergy   . ELECTROCARDIOGRAM, ABNORMAL 12/28/2008   Qualifier: Diagnosis of  By: Eden Emms, MD, Harrington Challenger   . GERD (gastroesophageal reflux disease)    hx ulcer  . H. pylori infection   . Osteoarthritis   . Plantar fasciitis   . Post-operative nausea and vomiting    1 time  . Sinusitis, chronic 12/22/2014   -hx sinus surgery, sees ENT     Past Surgical History:  Procedure Laterality Date  . KNEE ARTHROSCOPY W/ MENISCAL REPAIR     bilateral  . WISDOM TOOTH EXTRACTION      Family History  Problem Relation Age of Onset  . Stroke Mother   . Heart disease Mother        valvular  . Colon cancer Mother   . Stroke Father   . Diabetes Father   . Hypertension Father     SOCIAL HX: se ehpi   Current Outpatient Medications:  .  cetirizine (ZYRTEC) 10 MG tablet, Take 1 tablet (10 mg total) by mouth daily., Disp: 30 tablet, Rfl: 11 .  losartan (COZAAR) 50 MG  tablet, Take 1.5 tablets (75 mg total) by mouth daily., Disp: 135 tablet, Rfl: 1 .  atorvastatin (LIPITOR) 10 MG tablet, Take 1 tablet (10 mg total) by mouth daily., Disp: 90 tablet, Rfl: 1  EXAM:  Vitals:   05/29/18 1320  BP: 130/88  Pulse: 84  Temp: 98.3 F (36.8 C)    Body mass index is 32.66 kg/m.  GENERAL: vitals reviewed and listed above, alert, oriented, appears well hydrated and in no acute distress  HEENT: atraumatic, conjunttiva clear, no obvious abnormalities on inspection of external nose and ears  NECK: no obvious masses on inspection  LUNGS: clear to auscultation bilaterally, no wheezes, rales or rhonchi, good air movement  CV: HRRR, no peripheral edema  MS: moves all extremities without noticeable abnormality  PSYCH: pleasant and cooperative, no obvious depression or anxiety  ASSESSMENT AND PLAN:  Discussed the following assessment and plan:  Hyperlipidemia associated with type 2 diabetes mellitus (HCC)  Hypertension associated with diabetes (HCC) - Plan: losartan (COZAAR) 50 MG tablet  Diabetes mellitus without complication (HCC)  BMI 32.0-32.9,adult - obesity  -lifestyle recs, summarized below -discussed risks and tx options for HTN, Hyperlipidemia -he opted for trial lipitor, lipid clinic referral if does not tol, titrate slowly -increase losartan to 7.5mg  daily -reviewed optho notes -follow up 3 months -Patient advised to return or notify a doctor immediately if  symptoms worsen or persist or new concerns arise.  Patient Instructions  BEFORE YOU LEAVE: -obtain/update eye exam info -follow up: 3 months  INCREASE the losartan to 75mg  (1.5 tablets) daily.  Try the Lipitor for the cholesterol. Start with just 1 day per week for the first week, then 2x per week and so on. Let us know if you do not tolerate it and we will place a referral to the cholesterol specialist.  We have ordered labs or studies at this visit. It can take up to 1-2 weeks  for results and processing. IF results require follow up or explanation, we will call you with instructions. Clinically stable results will be released to your [facility]. If you have not heard from [place] or cannot find your results in [facility] in 2 weeks please contact our office at [phone].  If you are not yet signed up for [facility], please consider signing up.   We recommend the following healthy lifestyle for LIFE: 1) Small portions. But, make sure to get regular (at least 3 per day), healthy meals and small healthy snacks if needed.  2) Eat a healthy clean diet.   TRY TO EAT: -at least 5-7 servings of low sugar, colorful, and nutrient rich vegetables per day (not corn, potatoes or bananas.) -berries are the best choice if you wish to eat fruit (only eat small amounts if trying to reduce weight)  -lean meets (fish, white meat of chicken or [place]) -vegan proteins for some meals - beans or tofu, whole grains, nuts and seeds -Replace bad fats with good fats - good fats include: fish, nuts and seeds, canola oil, olive oil -small amounts of low fat or non fat dairy -small amounts of100 % whole grains - check the lables -drink plenty of water  AVOID: -SUGAR, sweets, anything with added sugar, corn syrup or sweeteners - must read labels as even foods advertised as "healthy" often are loaded with sugar -if you must have a sweetener, small amounts of stevia may be best -sweetened beverages and artificially sweetened beverages -simple starches (rice, bread, potatoes, pasta, chips, etc - small amounts of 100% whole grains are ok) -red meat, pork, butter -fried foods, fast food, processed food, excessive dairy, eggs and coconut.  3)Get at least 150 minutes of sweaty aerobic exercise per week.  4)Reduce stress - consider counseling, meditation and relaxation to balance other aspects of your life.          Terressa Koyanagi, DO

## 2018-08-15 ENCOUNTER — Encounter: Payer: Self-pay | Admitting: Internal Medicine

## 2018-08-28 ENCOUNTER — Encounter: Payer: Self-pay | Admitting: Family Medicine

## 2018-08-28 ENCOUNTER — Ambulatory Visit: Payer: POS | Admitting: Family Medicine

## 2018-08-28 ENCOUNTER — Other Ambulatory Visit: Payer: Self-pay

## 2019-03-04 ENCOUNTER — Telehealth (INDEPENDENT_AMBULATORY_CARE_PROVIDER_SITE_OTHER): Payer: POS | Admitting: Family Medicine

## 2019-03-04 DIAGNOSIS — J069 Acute upper respiratory infection, unspecified: Secondary | ICD-10-CM | POA: Diagnosis not present

## 2019-03-04 DIAGNOSIS — Z7189 Other specified counseling: Secondary | ICD-10-CM | POA: Diagnosis not present

## 2019-03-04 NOTE — Progress Notes (Signed)
Virtual Visit via Video Note  I connected with Marcus Wilkins on 03/04/19 at  4:00 PM EST by a video enabled telemedicine application 2/2 ATFTD-32 pandemic and verified that I am speaking with the correct person using two identifiers.  Location patient: home Location provider:work or home office Persons participating in the virtual visit: patient, provider  I discussed the limitations of evaluation and management by telemedicine and the availability of in person appointments. The patient expressed understanding and agreed to proceed.   HPI: Pt is a 53 yo male with pmh sig for chronic sinusitis, GERD, DM II, HLD, OA, allergies seen by Dr. Maudie Mercury.  Pt seen for acute concern of soreness around back and ribs, cold and sinus issues- chills, rhinorrhea, productive cough x 3 days.  Pt denies HAs, sore throat, loss of taste or smell, ear pain or pressure, n/v.  Pt has taking alka seltzer cold.  Appetite is good.  Denies sick contacts.  ROS: See pertinent positives and negatives per HPI.  Past Medical History:  Diagnosis Date  . Abdominal pain    right side radiating to left side  . Allergy   . ELECTROCARDIOGRAM, ABNORMAL 12/28/2008   Qualifier: Diagnosis of  By: Johnsie Cancel, MD, Rona Ravens   . GERD (gastroesophageal reflux disease)    hx ulcer  . H. pylori infection   . Osteoarthritis   . Plantar fasciitis   . Post-operative nausea and vomiting    1 time  . Sinusitis, chronic 12/22/2014   -hx sinus surgery, sees ENT     Past Surgical History:  Procedure Laterality Date  . KNEE ARTHROSCOPY W/ MENISCAL REPAIR     bilateral  . WISDOM TOOTH EXTRACTION      Family History  Problem Relation Age of Onset  . Stroke Mother   . Heart disease Mother        valvular  . Colon cancer Mother   . Stroke Father   . Diabetes Father   . Hypertension Father       Current Outpatient Medications:  .  atorvastatin (LIPITOR) 10 MG tablet, Take 1 tablet (10 mg total) by mouth daily., Disp: 90  tablet, Rfl: 1 .  cetirizine (ZYRTEC) 10 MG tablet, Take 1 tablet (10 mg total) by mouth daily., Disp: 30 tablet, Rfl: 11 .  losartan (COZAAR) 50 MG tablet, Take 1.5 tablets (75 mg total) by mouth daily., Disp: 135 tablet, Rfl: 1  EXAM:  VITALS per patient if applicable:  RR between 12-20 bpm  GENERAL: alert, oriented, appears well and in no acute distress  HEENT: atraumatic, conjunctiva clear, no obvious abnormalities on inspection of external nose and ears  NECK: normal movements of the head and neck  LUNGS: on inspection no signs of respiratory distress, breathing rate appears normal, no obvious gross SOB, gasping or wheezing  CV: no obvious cyanosis  MS: moves all visible extremities without noticeable abnormality  PSYCH/NEURO: pleasant and cooperative, no obvious depression or anxiety, speech and thought processing grossly intact  ASSESSMENT AND PLAN:  Discussed the following assessment and plan:  Viral upper respiratory tract infection -discussed possible causes of symptoms including viral URI, allergies, influenza, COVID-19 -supportive care: cough and cold meds, Tylenol, rest, hydration -can take flonase -self quarantine -given info for COVID community testing. -Given precautions  Educated about COVID-19 virus infection -discussed s/s -Given info on community testing sites -Patient presents to have testing done tomorrow.  Follow-up as needed    I discussed the assessment and treatment plan with the  patient. The patient was provided an opportunity to ask questions and all were answered. The patient agreed with the plan and demonstrated an understanding of the instructions.   The patient was advised to call back or seek an in-person evaluation if the symptoms worsen or if the condition fails to improve as anticipated.   Billie Ruddy, MD

## 2019-06-04 ENCOUNTER — Ambulatory Visit: Payer: POS | Attending: Internal Medicine

## 2019-06-04 DIAGNOSIS — Z23 Encounter for immunization: Secondary | ICD-10-CM

## 2019-06-04 NOTE — Progress Notes (Signed)
   Covid-19 Vaccination Clinic  Name:  Marcus Wilkins    MRN: [id number] DOB: February 10, 1966  06/04/2019  [name] was observed post Covid-19 immunization for 15 minutes without incident. He was provided with Vaccine Information Sheet and instruction to access the V-Safe system.   [name] was instructed to call 911 with any severe reactions post vaccine: Marland Kitchen Difficulty breathing  . Swelling of face and throat  . A fast heartbeat  . A bad rash all over body  . Dizziness and weakness   Immunizations Administered    Name Date Dose VIS Date Route   Pfizer COVID-19 Vaccine 06/04/2019  9:03 AM 0.3 mL 03/06/2019 Intramuscular   Manufacturer: [employer], [employer]   Lot: [id number]   NDC: [address]-1

## 2019-07-01 ENCOUNTER — Ambulatory Visit: Payer: POS | Attending: Internal Medicine

## 2019-07-01 DIAGNOSIS — Z23 Encounter for immunization: Secondary | ICD-10-CM

## 2019-07-01 NOTE — Progress Notes (Signed)
   Covid-19 Vaccination Clinic  Name:  Marcus Wilkins    MRN: [id number] DOB: 19-Mar-1966  07/01/2019  Mr. Blades was observed post Covid-19 immunization for 15 minutes without incident. He was provided with Vaccine Information Sheet and instruction to access the V-Safe system.   [name] was instructed to call 911 with any severe reactions post vaccine: Marland Kitchen Difficulty breathing  . Swelling of face and throat  . A fast heartbeat  . A bad rash all over body  . Dizziness and weakness   Immunizations Administered    Name Date Dose VIS Date Route   Pfizer COVID-19 Vaccine 07/01/2019  8:27 AM 0.3 mL 03/06/2019 Intramuscular   Manufacturer: [employer], [employer]   Lot: [id number]   NDC: [address]-1

## 2019-08-03 ENCOUNTER — Telehealth: Payer: Self-pay | Admitting: *Deleted

## 2019-08-03 NOTE — Telephone Encounter (Signed)
Forms received from [facility] to be completed for the patients physical.  I left a detailed message at the pts home number stating he has not been seen in over 1 year and needs to schedule an establish care visit with a new PCP of his choice if he is still going to come to our office as a patient as Dr Selena Batten is only doing phone visits and not physicals also.  Form faxed to [facility] at [phone] with a note stating we are unable to complete as the pt needs an appt.

## 2020-11-24 ENCOUNTER — Other Ambulatory Visit: Payer: Self-pay

## 2020-11-24 ENCOUNTER — Encounter: Payer: Self-pay | Admitting: Allergy & Immunology

## 2020-11-24 ENCOUNTER — Ambulatory Visit (INDEPENDENT_AMBULATORY_CARE_PROVIDER_SITE_OTHER): Payer: PRIVATE HEALTH INSURANCE | Admitting: Allergy & Immunology

## 2020-11-24 VITALS — BP 146/96 | HR 83 | Temp 98.2°F | Resp 16 | Ht 74.0 in | Wt 242.0 lb

## 2020-11-24 DIAGNOSIS — J302 Other seasonal allergic rhinitis: Secondary | ICD-10-CM | POA: Diagnosis not present

## 2020-11-24 DIAGNOSIS — L500 Allergic urticaria: Secondary | ICD-10-CM | POA: Diagnosis not present

## 2020-11-24 DIAGNOSIS — J3089 Other allergic rhinitis: Secondary | ICD-10-CM | POA: Diagnosis not present

## 2020-11-24 MED ORDER — MONTELUKAST SODIUM 10 MG PO TABS: 10.0000 mg | ORAL_TABLET | Freq: Every day | ORAL | 5 refills | Status: DC

## 2020-11-24 NOTE — Progress Notes (Signed)
NEW PATIENT  Date of Service/Encounter:  11/24/20  Consult requested by: No primary care provider on file.   Assessment:   Allergic urticaria   Seasonal and perennial allergic rhinitis (grasses, ragweed, weeds, trees, indoor molds, outdoor molds, dust mites, cat, dog, and cockroach)  Plan/Recommendations:   1. Allergic urticaria - You had several triggers that could be causing your hives. - We we will hold off on labs for now. - Start Allegra (fexofenadine) 180 mg once daily to help suppress the hives. - Start Singulair (montelukast) 10mg  once daily to help suppress the hives. - If you are not doing well at the next visit, we can do labs.  2. Chronic rhinitis - Testing today showed: grasses, ragweed, weeds, trees, indoor molds, outdoor molds, dust mites, cat, dog, and cockroach - Copy of test results provided.  - Avoidance measures provided. - Start taking: medications as above - You can use an extra dose of the antihistamine, if needed, for breakthrough symptoms.  - Consider nasal saline rinses 1-2 times daily to remove allergens from the nasal cavities as well as help with mucous clearance (this is especially helpful to do before the nasal sprays are given) - Consider allergy shots as a means of long-term control. - Allergy shots "re-train" and "reset" the immune system to ignore environmental allergens and decrease the resulting immune response to those allergens (sneezing, itchy watery eyes, runny nose, nasal congestion, etc).    - Allergy shots improve symptoms in 75-85% of patients.  - We can discuss more at the next appointment if the medications are not working for you.  3. Return in about 3 months (around 02/23/2021).    This note in its entirety was forwarded to the Provider who requested this consultation.  Subjective:   Marcus Wilkins is a 55 y.o. male presenting today for evaluation of  Chief Complaint  Patient presents with   Establish Care    Sinus  issues--he could go to sleep and would wake up and feel itchy, breakout skin    Marcus Wilkins has a history of the following: Patient Active Problem List   Diagnosis Date Noted   Allergic urticaria 11/24/2020   Seasonal and perennial allergic rhinitis 11/24/2020   Hyperlipidemia associated with type 2 diabetes mellitus (HCC) 09/14/2016   Hypertension associated with diabetes (HCC) 09/14/2016   Serum total bilirubin elevated 06/18/2016   Diabetes mellitus without complication (HCC) 06/18/2016   BMI 33.0-33.9,adult 05/17/2016   Gallstones 05/17/2016   Allergic rhinitis due to pollen 01/24/2015   Plantar fasciitis 07/28/2013   Osteoarthritis of both knees 07/28/2013   GERD 12/28/2008    History obtained from: chart review and patient.  Marcus Wilkins was referred by No primary care provider on file.Marcus Wilkins.     Marcus Wilkins is a 55 y.o. male presenting for an evaluation of itching . He was previously seen in 2016 by Dr. Beaulah DinningBardelas for evaluation of chronic sinusitis. He did have a deviated septum repair performed which helped with the sinusitis.  He does not have any recurrent antibiotic courses at this point  He is now having welts. These started around 3 or 4 months ago. He was not exposed to anything in particular that caused. They are all over his body and it is not the same area every time. It can be on the back or legs or arms. Sometimes they leave knots that go away. He has tried treating it with Benadryl. This does help He has not had any longer  acting antihistamines like Allegra or Zyrtec. He did try "Allegra for hives". It still came back. It relieved him for a bit and then they would come back.  He does not take Allegra or any antihistamine every day to suppress the hives.  He has not had throat issues but does report a numbing sensation in his mouth occasionally. It does not seem to be triggered by any foods at all. He would eat and then he would not have anything that happened right away. It will  leave normal skin and occasionally leave a residual changes. There are no permanent color changes. He denies fevers and joint pain.   He has a history of chronic sinusitis. He has not had any worsening symptoms. He does not use any nose sprays at all. He tries to avoid taking stuff unless it is "really necessary". He does not use any antibiotics lately.   He has been with the [employer] for over 30 years. [place] Symptoms happen at work but do not seem worse there.   Otherwise, there is no history of other atopic diseases, including asthma, food allergies, drug allergies, stinging insect allergies, eczema, or contact dermatitis. There is no significant infectious history. Vaccinations are up to date.    Past Medical History: Patient Active Problem List   Diagnosis Date Noted   Allergic urticaria 11/24/2020   Seasonal and perennial allergic rhinitis 11/24/2020   Hyperlipidemia associated with type 2 diabetes mellitus (HCC) 09/14/2016   Hypertension associated with diabetes (HCC) 09/14/2016   Serum total bilirubin elevated 06/18/2016   Diabetes mellitus without complication (HCC) 06/18/2016   BMI 33.0-33.9,adult 05/17/2016   Gallstones 05/17/2016   Allergic rhinitis due to pollen 01/24/2015   Plantar fasciitis 07/28/2013   Osteoarthritis of both knees 07/28/2013   GERD 12/28/2008    Medication List:  Allergies as of 11/24/2020   No Known Allergies      Medication List        Accurate as of November 24, 2020 11:27 AM. If you have any questions, ask your nurse or doctor.          STOP taking these medications    atorvastatin 10 MG tablet Commonly known as: LIPITOR Stopped by: Alfonse Spruce, MD   cetirizine 10 MG tablet Commonly known as: ZYRTEC Stopped by: Alfonse Spruce, MD   losartan 50 MG tablet Commonly known as: COZAAR Stopped by: Alfonse Spruce, MD       TAKE these medications    montelukast 10 MG  tablet Commonly known as: Singulair Take 1 tablet (10 mg total) by mouth at bedtime. Started by: Alfonse Spruce, MD        Birth History: non-contributory  Developmental History: non-contributory  Past Surgical History: Past Surgical History:  Procedure Laterality Date   KNEE ARTHROSCOPY W/ MENISCAL REPAIR     bilateral   WISDOM TOOTH EXTRACTION       Family History: Family History  Problem Relation Age of Onset   Stroke Mother    Heart disease Mother        valvular   Colon cancer Mother    Stroke Father    Diabetes Father    Hypertension Father      Social History: Vayden lives at home in a house. There is carpeting and wood throughout the home. There is gas heating and central cooling. There are no animals inside or outside of the home. There are no dust mite coverings  on the bedding, including the pillows.  There is no tobacco exposure.  He works as a Merchandiser, retail at [employer] [place]  He has been there for 30 years.  He is exposed to quite a few environmental triggers at work.  However, he still has some symptoms at home, too.  There is no HEPA filter in the home. He does not live near an interstate or industrial area.   Review of Systems  Constitutional: Negative.  Negative for chills, fever, malaise/fatigue and weight loss.  HENT:  Positive for congestion. Negative for ear discharge and ear pain.   Eyes:  Negative for pain, discharge and redness.  Respiratory:  Negative for cough, sputum production, shortness of breath and wheezing.   Cardiovascular: Negative.  Negative for chest pain and palpitations.  Gastrointestinal:  Negative for abdominal pain, constipation, diarrhea, heartburn, nausea and vomiting.  Skin:  Positive for itching and rash.  Neurological:  Negative for dizziness and headaches.  Endo/Heme/Allergies:  Negative for environmental allergies. Does not bruise/bleed easily.      Objective:   Blood pressure (!) 146/96, pulse 83,  temperature 98.2 F (36.8 C), temperature source Temporal, resp. rate 16, height 6\' 2"  (1.88 m), weight 242 lb (109.8 kg), SpO2 98 %. Body mass index is 31.07 kg/m.   Physical Exam:   Physical Exam Vitals reviewed.  Constitutional:      Appearance: He is well-developed.  HENT:     Head: Normocephalic and atraumatic.     Right Ear: Tympanic membrane, ear canal and external ear normal. No drainage, swelling or tenderness. Tympanic membrane is not injected, scarred, erythematous, retracted or bulging.     Left Ear: Tympanic membrane, ear canal and external ear normal. No drainage, swelling or tenderness. Tympanic membrane is not injected, scarred, erythematous, retracted or bulging.     Nose: No nasal deformity, septal deviation, mucosal edema or rhinorrhea.     Right Turbinates: Enlarged, swollen and pale.     Left Turbinates: Enlarged, swollen and pale.     Right Sinus: No maxillary sinus tenderness or frontal sinus tenderness.     Left Sinus: No maxillary sinus tenderness or frontal sinus tenderness.     Comments:  bilaterally.  No nasal polyps.    Mouth/Throat:     Mouth: Mucous membranes are not pale and not dry.     Pharynx: Uvula midline.     Comments: Cobblestoning present in the posterior oropharynx. Eyes:     General:        Right eye: No discharge.        Left eye: No discharge.     Conjunctiva/sclera: Conjunctivae normal.     Right eye: Right conjunctiva is not injected. No chemosis.    Left eye: Left conjunctiva is not injected. No chemosis.    Pupils: Pupils are equal, round, and reactive to light.  Cardiovascular:     Rate and Rhythm: Normal rate and regular rhythm.     Heart sounds: Normal heart sounds.  Pulmonary:     Effort: Pulmonary effort is normal. No tachypnea, accessory muscle usage or respiratory distress.     Breath sounds: Normal breath sounds. No wheezing, rhonchi or rales.     Comments: Moving air well in all lung fields.  No increased work of  breathing. Chest:     Chest wall: No tenderness.  Abdominal:     Tenderness: There is no abdominal tenderness. There is no guarding or rebound.  Lymphadenopathy:  Head:     Right side of head: No submandibular, tonsillar or occipital adenopathy.     Left side of head: No submandibular, tonsillar or occipital adenopathy.     Cervical: No cervical adenopathy.  Skin:    Coloration: Skin is not pale.     Findings: No abrasion, erythema, petechiae or rash. Rash is not papular, urticarial or vesicular.  Neurological:     Mental Status: He is alert.     Diagnostic studies:   Allergy Studies:     Airborne Adult Perc - 11/24/20 0927     Time Antigen Placed 7262    Allergen Manufacturer Waynette Buttery    Location Back    Number of Test 59    1. Control-Buffer 50% Glycerol Negative    2. Control-Histamine 1 mg/ml 2+    3. Albumin saline Negative    4. Bahia 2+    5. [place] 2+    6. Johnson Negative    7. Kentucky Blue 3+    8. Meadow Fescue 3+    9. Perennial Rye 3+    10. Sweet Vernal 3+    11. Timothy Negative    12. Cocklebur Negative    13. Burweed Marshelder Negative    14. Ragweed, short Negative    15. Ragweed, Giant Negative    16. Plantain,  English Negative    17. Lamb's Quarters Negative    18. Sheep Sorrell Negative    19. Rough Pigweed Negative    20. Marsh Elder, Rough Negative    21. Mugwort, Common Negative    22. Ash mix Negative    23. Birch mix Negative    24. Beech American Negative    25. Box, Elder Negative    26. Cedar, red Negative    27. Cottonwood, Guinea-Bissau Negative    28. Elm mix Negative    29. Hickory 3+    30. Maple mix Negative    31. Oak, Guinea[place] mix Negative    32. Pecan Pollen 3+    33. Pine mix Negative    34. Sycamore Eastern Negative    35. Walnut, Black Pollen Negative    36. Alternaria alternata Negative    37. Cladosporium Herbarum Negative    38. Aspergillus mix Negative    39. Penicillium mix Negative    40. Bipolaris  sorokiniana (Helminthosporium) Negative    41. Drechslera spicifera (Curvularia) Negative    42. Mucor plumbeus Negative    43. Fusarium moniliforme Negative    44. Aureobasidium pullulans (pullulara) Negative    45. Rhizopus oryzae Negative    46. Botrytis cinera Negative    47. Epicoccum nigrum Negative    48. Phoma betae Negative    49. Candida Albicans Negative    50. Trichophyton mentagrophytes Negative    51. Mite, D Farinae  5,000 AU/ml 3+    52. Mite, D Pteronyssinus  5,000 AU/ml Negative    53. Cat Hair 10,000 BAU/ml 3+    54.  Dog Epithelia Negative    55. Mixed Feathers Negative    56. Horse Epithelia Negative    57. Cockroach, German 3+    58. Mouse Negative    59. Tobacco Leaf Negative             Food Perc - 11/24/20 0927       Test Information   Time Antigen Placed 0355    Allergen Manufacturer Waynette Buttery    Location Back    Number of allergen test 10  Food   1. Peanut Negative    2. Soybean food Negative    3. Wheat, whole Negative    4. Sesame Negative    5. Milk, cow Negative    6. Egg White, chicken Negative    7. Casein Negative    8. Shellfish mix Negative    9. Fish mix Negative    10. Cashew Negative               Allergy testing results were read and interpreted by myself, documented by clinical staff.         Malachi Bonds, MD [facility]

## 2020-11-24 NOTE — Patient Instructions (Addendum)
1. Allergic urticaria - You had several triggers that could be causing her hives. - We we will hold off on labs for now. - Start Allegra (fexofenadine) 180 mg once daily to help suppress the hives. - Start Singulair (montelukast) 10mg  once daily to help suppress the hives. - If you are not doing well at the next visit, we can do labs.  2. Chronic rhinitis - Testing today showed: grasses, ragweed, weeds, trees, indoor molds, outdoor molds, dust mites, cat, dog, and cockroach - Copy of test results provided.  - Avoidance measures provided. - Start taking: medications as above - You can use an extra dose of the antihistamine, if needed, for breakthrough symptoms.  - Consider nasal saline rinses 1-2 times daily to remove allergens from the nasal cavities as well as help with mucous clearance (this is especially helpful to do before the nasal sprays are given) - Consider allergy shots as a means of long-term control. - Allergy shots "re-train" and "reset" the immune system to ignore environmental allergens and decrease the resulting immune response to those allergens (sneezing, itchy watery eyes, runny nose, nasal congestion, etc).    - Allergy shots improve symptoms in 75-85% of patients.  - We can discuss more at the next appointment if the medications are not working for you.  3. Return in about 3 months (around 02/23/2021).    Please inform us of any Emergency Department visits, hospitalizations, or changes in symptoms. Call us before going to the ED for breathing or allergy symptoms since we might be able to fit you in for a sick visit. Feel free to contact us anytime with any questions, problems, or concerns.  It was a pleasure to meet you today!  Websites that have reliable patient information: 1. American Academy of Asthma, Allergy, and Immunology: www.aaaai.org 2. Food Allergy Research and Education (FARE): foodallergy.org 3. Mothers of Asthmatics:  http://www.asthmacommunitynetwork.org 4. American College of Allergy, Asthma, and Immunology: www.acaai.org   COVID-19 Vaccine Information can be found at: [email or web address] For questions related to vaccine distribution or appointments, please email [email or web address] or call [phone].   We realize that you might be concerned about having an allergic reaction to the COVID19 vaccines. To help with that concern, WE ARE OFFERING THE COVID19 VACCINES IN OUR OFFICE! Ask the front desk for dates!     "Like" us on Facebook and Instagram for our latest updates!      A healthy democracy works best when [employer] voters participate! Make sure you are registered to vote! If you have moved or changed any of your contact information, you will need to get this updated before voting!  In some cases, you MAY be able to register to vote online: [email or web address]   Reducing Pollen Exposure  The American Academy of Allergy, Asthma and Immunology suggests the following steps to reduce your exposure to pollen during allergy seasons.    Do not hang sheets or clothing out to dry; pollen may collect on these items. Do not mow lawns or spend time around freshly cut grass; mowing stirs up pollen. Keep windows closed at night.  Keep car windows closed while driving. Minimize morning activities outdoors, a time when pollen counts are usually at their highest. Stay indoors as much as possible when pollen counts or humidity is high and on windy days when pollen tends to remain in the air longer. Use air conditioning when possible.  Many air conditioners have filters that trap the pollen spores. Use a HEPA room air filter to  remove pollen form the indoor air you breathe.  .[email or web address]  Control of Dog or Cat Allergen  Avoidance is the best way to manage a dog or cat allergy. If you have a dog or cat and are allergic  to dog or cats, consider removing the dog or cat from the home. If you have a dog or cat but don't want to find it a new home, or if your family wants a pet even though someone in the household is allergic, here are some strategies that may help keep symptoms at bay:  Keep the pet out of your bedroom and restrict it to only a few rooms. Be advised that keeping the dog or cat in only one room will not limit the allergens to that room. Don't pet, hug or kiss the dog or cat; if you do, wash your hands with soap and water. High-efficiency particulate air (HEPA) cleaners run continuously in a bedroom or living room can reduce allergen levels over time. Regular use of a high-efficiency vacuum cleaner or a central vacuum can reduce allergen levels. Giving your dog or cat a bath at least once a week can reduce airborne allergen.  Control of Dust Mite Allergen   Dust mites play a major role in allergic asthma and rhinitis.  They occur in environments with high humidity wherever human skin is found.  Dust mites absorb humidity from the atmosphere (ie, they do not drink) and feed on organic matter (including shed human and animal skin).  Dust mites are a microscopic type of insect that you cannot see with the naked eye.  High levels of dust mites have been detected from mattresses, pillows, carpets, upholstered furniture, bed covers, clothes, soft toys and any woven material.  The principal allergen of the dust mite is found in its feces.  A gram of dust may contain 1,000 mites and 250,000 fecal particles.  Mite antigen is easily measured in the air during house cleaning activities.  Dust mites do not bite and do not cause harm to humans, other than by triggering allergies/asthma.    Ways to decrease your exposure to dust mites in your home:  Encase mattresses, box springs and pillows with a mite-impermeable barrier or cover   Wash sheets, blankets and drapes weekly in hot water (130 F) with detergent and dry  them in a dryer on the hot setting.  Have the room cleaned frequently with a vacuum cleaner and a damp dust-mop.  For carpeting or rugs, vacuuming with a vacuum cleaner equipped with a high-efficiency particulate air (HEPA) filter.  The dust mite allergic individual should not be in a room which is being cleaned and should wait 1 hour after cleaning before going into the room. Do not sleep on upholstered furniture (eg, couches).   If possible removing carpeting, upholstered furniture and drapery from the home is ideal.  Horizontal blinds should be eliminated in the rooms where the person spends the most time (bedroom, study, television room).  Washable vinyl, roller-type shades are optimal. Remove all non-washable stuffed toys from the bedroom.  Wash stuffed toys weekly like sheets and blankets above.   Reduce indoor humidity to less than 50%.  Inexpensive humidity monitors can be purchased at most hardware stores.  Do not use a humidifier as can make the problem worse and are not recommended.  Control of Cockroach Allergen  Cockroach allergen has been identified as an important cause of acute attacks of asthma, especially in urban settings.  There are fifty-five  species of cockroach that exist in the [place], however only three, the [place], [place] species produce allergen that can affect patients with Asthma.  Allergens can be obtained from fecal particles, egg casings and secretions from cockroaches.    Remove food sources. Reduce access to water. Seal access and entry points. Spray runways with 0.5-1% Diazinon or Chlorpyrifos Blow boric acid power under stoves and refrigerator. Place bait stations (hydramethylnon) at feeding sites.   Allergy Shots   Allergies are the result of a chain reaction that starts in the immune system. Your immune system controls how your body defends itself. For instance, if you have an allergy to pollen, your immune system identifies pollen as  an invader or allergen. Your immune system overreacts by producing antibodies called Immunoglobulin E (IgE). These antibodies travel to cells that release chemicals, causing an allergic reaction.  The concept behind allergy immunotherapy, whether it is received in the form of shots or tablets, is that the immune system can be desensitized to specific allergens that trigger allergy symptoms. Although it requires time and patience, the payback can be long-term relief.  How Do Allergy Shots Work?  Allergy shots work much like a vaccine. Your body responds to injected amounts of a particular allergen given in increasing doses, eventually developing a resistance and tolerance to it. Allergy shots can lead to decreased, minimal or no allergy symptoms.  There generally are two phases: build-up and maintenance. Build-up often ranges from three to six months and involves receiving injections with increasing amounts of the allergens. The shots are typically given once or twice a week, though more rapid build-up schedules are sometimes used.  The maintenance phase begins when the most effective dose is reached. This dose is different for each person, depending on how allergic you are and your response to the build-up injections. Once the maintenance dose is reached, there are longer periods between injections, typically two to four weeks.  Occasionally doctors give cortisone-type shots that can temporarily reduce allergy symptoms. These types of shots are different and should not be confused with allergy immunotherapy shots.  Who Can Be Treated with Allergy Shots?  Allergy shots may be a good treatment approach for people with allergic rhinitis (hay fever), allergic asthma, conjunctivitis (eye allergy) or stinging insect allergy.   Before deciding to begin allergy shots, you should consider:   The length of allergy season and the severity of your symptoms  Whether medications and/or changes to your  environment can control your symptoms  Your desire to avoid long-term medication use  Time: allergy immunotherapy requires a major time commitment  Cost: may vary depending on your insurance coverage  Allergy shots for children age 67 and older are effective and often well tolerated. They might prevent the onset of new allergen sensitivities or the progression to asthma.  Allergy shots are not started on patients who are pregnant but can be continued on patients who become pregnant while receiving them. In some patients with other medical conditions or who take certain common medications, allergy shots may be of risk. It is important to mention other medications you talk to your allergist.   When Will I Feel Better?  Some may experience decreased allergy symptoms during the build-up phase. For others, it may take as long as 12 months on the maintenance dose. If there is no improvement after a year of maintenance, your allergist will discuss other treatment options with you.  If you aren't responding to allergy shots, it  may be because there is not enough dose of the allergen in your vaccine or there are missing allergens that were not identified during your allergy testing. Other reasons could be that there are high levels of the allergen in your environment or major exposure to non-allergic triggers like tobacco smoke.  What Is the Length of Treatment?  Once the maintenance dose is reached, allergy shots are generally continued for three to five years. The decision to stop should be discussed with your allergist at that time. Some people may experience a permanent reduction of allergy symptoms. Others may relapse and a longer course of allergy shots can be considered.  What Are the Possible Reactions?  The two types of adverse reactions that can occur with allergy shots are local and systemic. Common local reactions include very mild redness and swelling at the injection site, which can  happen immediately or several hours after. A systemic reaction, which is less common, affects the entire body or a particular body system. They are usually mild and typically respond quickly to medications. Signs include increased allergy symptoms such as sneezing, a stuffy nose or hives.  Rarely, a serious systemic reaction called anaphylaxis can develop. Symptoms include swelling in the throat, wheezing, a feeling of tightness in the chest, nausea or dizziness. Most serious systemic reactions develop within 30 minutes of allergy shots. This is why it is strongly recommended you wait in your doctor's office for 30 minutes after your injections. Your allergist is trained to watch for reactions, and his or her staff is trained and equipped with the proper medications to identify and treat them.  Who Should Administer Allergy Shots?  The preferred location for receiving shots is your prescribing allergist's office. Injections can sometimes be given at another facility where the physician and staff are trained to recognize and treat reactions, and have received instructions by your prescribing allergist.

## 2021-03-02 ENCOUNTER — Other Ambulatory Visit: Payer: Self-pay

## 2021-03-02 ENCOUNTER — Ambulatory Visit (INDEPENDENT_AMBULATORY_CARE_PROVIDER_SITE_OTHER): Payer: PRIVATE HEALTH INSURANCE | Admitting: Allergy & Immunology

## 2021-03-02 ENCOUNTER — Encounter: Payer: Self-pay | Admitting: Allergy & Immunology

## 2021-03-02 VITALS — BP 140/80 | HR 92 | Temp 97.7°F | Resp 18 | Ht 74.0 in | Wt 238.2 lb

## 2021-03-02 DIAGNOSIS — J302 Other seasonal allergic rhinitis: Secondary | ICD-10-CM | POA: Diagnosis not present

## 2021-03-02 DIAGNOSIS — L500 Allergic urticaria: Secondary | ICD-10-CM | POA: Diagnosis not present

## 2021-03-02 DIAGNOSIS — J3089 Other allergic rhinitis: Secondary | ICD-10-CM | POA: Diagnosis not present

## 2021-03-02 NOTE — Progress Notes (Signed)
FOLLOW UP  Date of Service/Encounter:  03/02/21   Assessment:   Allergic urticaria - getting screening labs today   Seasonal and perennial allergic rhinitis (grasses, ragweed, weeds, trees, indoor molds, outdoor molds, dust mites, cat, dog, and cockroach)  Plan/Recommendations:   1. Allergic urticaria - We will get some labs to look for serious difficult to control causes of hives.  - We will call you in 1-2 weeks with the results of the testing.  - Continue Allegra (fexofenadine) 180 mg once daily to help suppress the hives. - Continue Singulair (montelukast) 10mg  once daily to help suppress the hives. - We could add more antihistamines to your regimen but Xolair might be the better option (handout provided).   2. Chronic rhinitis (grasses, ragweed, weeds, trees, indoor molds, outdoor molds, dust mites, cat, dog, and cockroach) - Continue taking: Allegra and Singulair.  - You can use an extra dose of the antihistamine, if needed, for breakthrough symptoms.  - We could allergy shots as a means of long-term control, but these are more effective for the runny nose, congestion, sneezing, itchy eyes, etc. (I am NOT sure that it would help your hives and swelling).   3. Return in about 3 months (around 05/31/2021).    Subjective:   Marcus Wilkins is a 55 y.o. male presenting today for follow up of  Chief Complaint  Patient presents with   Urticaria    Going ok but would like to discuss having blood work done. Patient states that when he eats anything he's concerned about his lip swelling.   Allergic Rhinitis     Not to bad    Marcus Wilkins has a history of the following: Patient Active Problem List   Diagnosis Date Noted   Allergic urticaria 11/24/2020   Seasonal and perennial allergic rhinitis 11/24/2020   Hyperlipidemia associated with type 2 diabetes mellitus (HCC) 09/14/2016   Hypertension associated with diabetes (HCC) 09/14/2016   Serum total bilirubin elevated  06/18/2016   Diabetes mellitus without complication (HCC) 06/18/2016   BMI 33.0-33.9,adult 05/17/2016   Gallstones 05/17/2016   Allergic rhinitis due to pollen 01/24/2015   Plantar fasciitis 07/28/2013   Osteoarthritis of both knees 07/28/2013   GERD 12/28/2008    History obtained from: chart review and patient.  Marcus Wilkins is a 55 y.o. male presenting for a follow up visit.  He was last seen in September 2022.  At that time, he was endorsing urticaria.  We started Allegra once daily and montelukast once daily.  We did not get labs today for some cough.  He underwent environmental allergy testing that was positive to multiple indoor and outdoor allergens.  We started him on the Allegra and the Singulair.  We did discuss allergen immunotherapy.  Since last visit, he has done mostly well. He has not been having reactions as often. But when he does his main concern is that he has been on the medication and he will have a breakthrough reaction. It does not get as serious as well.  He does have the swelling in his lip occasionally. This is not every day. But he estimates that this is twice per month. Prior to that, he was having issues 3-4 times per week  When he has these breakthrough reactions, he will take Singulair and just try to sleep it off. He also takes a shower and get cleaned up. It more often occurs at home versus at work. He does not have animals. Time of the day  varies. It is not necessarily only happening at night. The lip swelling and itching might wake him up, but this is not frequent.  He has not had antibiotics at all since the last visit.  Otherwise, there have been no changes to his past medical history, surgical history, family history, or social history.    Review of Systems  Constitutional: Negative.  Negative for fever, malaise/fatigue and weight loss.  HENT: Negative.  Negative for congestion, ear discharge and ear pain.   Eyes:  Negative for pain, discharge and redness.   Respiratory:  Negative for cough, sputum production, shortness of breath and wheezing.   Cardiovascular: Negative.  Negative for chest pain and palpitations.  Gastrointestinal:  Negative for abdominal pain, heartburn, nausea and vomiting.  Skin:  Positive for itching and rash.       Positive for lip swelling.  Neurological:  Negative for dizziness and headaches.  Endo/Heme/Allergies:  Negative for environmental allergies. Does not bruise/bleed easily.      Objective:   Blood pressure 140/80, pulse 92, temperature 97.7 F (36.5 C), resp. rate 18, height 6\' 2"  (1.88 m), weight 238 lb 3.2 oz (108 kg), SpO2 98 %. Body mass index is 30.58 kg/m.   Physical Exam:  Physical Exam Vitals reviewed.  Constitutional:      Appearance: He is well-developed.     Comments: Pleasant male.  Cooperative with exam.  HENT:     Head: Normocephalic and atraumatic.     Right Ear: Tympanic membrane, ear canal and external ear normal. No drainage, swelling or tenderness. Tympanic membrane is not injected, scarred, erythematous, retracted or bulging.     Left Ear: Tympanic membrane, ear canal and external ear normal. No drainage, swelling or tenderness. Tympanic membrane is not injected, scarred, erythematous, retracted or bulging.     Nose: No nasal deformity, septal deviation, mucosal edema or rhinorrhea.     Right Turbinates: Enlarged, swollen and pale.     Left Turbinates: Enlarged, swollen and pale.     Right Sinus: No maxillary sinus tenderness or frontal sinus tenderness.     Left Sinus: No maxillary sinus tenderness or frontal sinus tenderness.     Comments:  bilaterally.  No nasal polyps.    Mouth/Throat:     Mouth: Mucous membranes are not pale and not dry.     Pharynx: Uvula midline.     Comments: Cobblestoning present in the posterior oropharynx. Eyes:     General:        Right eye: No discharge.        Left eye: No discharge.     Conjunctiva/sclera: Conjunctivae normal.     Right eye:  Right conjunctiva is not injected. No chemosis.    Left eye: Left conjunctiva is not injected. No chemosis.    Pupils: Pupils are equal, round, and reactive to light.  Cardiovascular:     Rate and Rhythm: Normal rate and regular rhythm.     Heart sounds: Normal heart sounds.  Pulmonary:     Effort: Pulmonary effort is normal. No tachypnea, accessory muscle usage or respiratory distress.     Breath sounds: Normal breath sounds. No wheezing, rhonchi or rales.     Comments: Moving air well in all lung fields.  No increased work of breathing. Chest:     Chest wall: No tenderness.  Lymphadenopathy:     Head:     Right side of head: No submandibular, tonsillar or occipital adenopathy.     Left side of head:  No submandibular, tonsillar or occipital adenopathy.     Cervical: No cervical adenopathy.  Skin:    General: Skin is warm.     Capillary Refill: Capillary refill takes less than 2 seconds.     Coloration: Skin is not pale.     Findings: No abrasion, erythema, petechiae or rash. Rash is not papular, urticarial or vesicular.  Neurological:     Mental Status: He is alert.  Psychiatric:        Behavior: Behavior is cooperative.     Diagnostic studies: labs sent instead      Marcus Bonds, MD  [facility]

## 2021-03-02 NOTE — Patient Instructions (Addendum)
1. Allergic urticaria - We will get some labs to look for serious difficult to control causes of hives.  - We will call you in 1-2 weeks with the results of the testing.  - Continue Allegra (fexofenadine) 180 mg once daily to help suppress the hives. - Continue Singulair (montelukast) 10mg  once daily to help suppress the hives. - We could add more antihistamines to your regimen but Xolair might be the better option (handout provided).   2. Chronic rhinitis (grasses, ragweed, weeds, trees, indoor molds, outdoor molds, dust mites, cat, dog, and cockroach) - Continue taking: Allegra and Singulair.  - You can use an extra dose of the antihistamine, if needed, for breakthrough symptoms.  - We could allergy shots as a means of long-term control, but these are more effective for the runny nose, congestion, sneezing, itchy eyes, etc. (I am NOT sure that it would help your hives and swelling).   3. Return in about 3 months (around 05/31/2021).    Please inform 07/31/2021 of any Emergency Department visits, hospitalizations, or changes in symptoms. Call us before going to the ED for breathing or allergy symptoms since we might be able to fit you in for a sick visit. Feel free to contact us anytime with any questions, problems, or concerns.  It was a pleasure to see you again today!  Websites that have reliable patient information: 1. American Academy of Asthma, Allergy, and Immunology: www.aaaai.org 2. Food Allergy Research and Education (FARE): foodallergy.org 3. Mothers of Asthmatics: http://www.asthmacommunitynetwork.org 4. American College of Allergy, Asthma, and Immunology: www.acaai.org   COVID-19 Vaccine Information can be found at: [place] For questions related to vaccine distribution or appointments, please email [email or web address] or call [phone].   We realize that you might be concerned about having an allergic reaction  to the COVID19 vaccines. To help with that concern, WE ARE OFFERING THE COVID19 VACCINES IN OUR OFFICE! Ask the front desk for dates!     "Like" [phone] on Facebook and Instagram for our latest updates!      A healthy democracy works best when [place] participate! Make sure you are registered to vote! If you have moved or changed any of your contact information, you will need to get this updated before voting!  In some cases, you MAY be able to register to vote online: [employer]

## 2021-03-05 LAB — ALPHA-GAL PANEL
Allergen Lamb IgE: <0.1 kU/L
Beef IgE: 0.12 kU/L — AB
IgE (Immunoglobulin E), Serum: 1731 IU/mL — ABNORMAL HIGH (ref 6–495)
O215-IgE Alpha-Gal: <0.1 kU/L
Pork IgE: <0.1 kU/L

## 2021-03-14 ENCOUNTER — Telehealth: Payer: Self-pay | Admitting: *Deleted

## 2021-03-14 NOTE — Telephone Encounter (Signed)
Tried to contact patient to discuss Xolair for hives no answer and unable to l/m

## 2021-03-14 NOTE — Telephone Encounter (Signed)
-----   Message from Alfonse Spruce, MD sent at 03/02/2021 11:53 AM EST ----- Possible Xolair start - maybe reach out early next week?

## 2021-03-15 LAB — CMP14+EGFR
ALT: 24 IU/L (ref 0–44)
AST: 24 IU/L (ref 0–40)
Albumin/Globulin Ratio: 1.3 (ref 1.2–2.2)
Albumin: 4.4 g/dL (ref 3.8–4.9)
Alkaline Phosphatase: 92 IU/L (ref 44–121)
BUN/Creatinine Ratio: 14 (ref 9–20)
BUN: 13 mg/dL (ref 6–24)
Bilirubin Total: 0.8 mg/dL (ref 0.0–1.2)
CO2: 25 mmol/L (ref 20–29)
Calcium: 9.5 mg/dL (ref 8.7–10.2)
Chloride: 104 mmol/L (ref 96–106)
Creatinine, Ser: 0.91 mg/dL (ref 0.76–1.27)
Globulin, Total: 3.3 g/dL (ref 1.5–4.5)
Glucose: 114 mg/dL — ABNORMAL HIGH (ref 70–99)
Potassium: 4.3 mmol/L (ref 3.5–5.2)
Sodium: 143 mmol/L (ref 134–144)
Total Protein: 7.7 g/dL (ref 6.0–8.5)
eGFR: 98 mL/min/{1.73_m2} (ref >59)

## 2021-03-15 LAB — C-REACTIVE PROTEIN: CRP: 2 mg/L (ref 0–10)

## 2021-03-15 LAB — SEDIMENTATION RATE: Sed Rate: 14 mm/hr (ref 0–30)

## 2021-03-15 LAB — CHRONIC URTICARIA: cu index: 8.1 (ref <10)

## 2021-03-15 LAB — TRYPTASE: Tryptase: 1.9 ug/L — ABNORMAL LOW (ref 2.2–13.2)

## 2021-03-15 LAB — ANTINUCLEAR ANTIBODIES, IFA: ANA Titer 1: NEGATIVE

## 2021-03-23 NOTE — Telephone Encounter (Signed)
L/m on home number to contact me if interested in Xolair

## 2021-03-27 NOTE — Telephone Encounter (Signed)
Received call from patient and discussed Xolair for his hives. He advised he will think about same and reach out to me if he decides to start therapy

## 2021-03-31 ENCOUNTER — Telehealth: Payer: Self-pay

## 2021-03-31 NOTE — Telephone Encounter (Signed)
-----   Message from Alfonse Spruce, MD sent at 03/28/2021 10:43 AM EST ----- I think he needs to see Dermatology for a biopsy of the lesion.   Can someone put in a referral for Dermatology (ICD 10 code: L53. 9)?   Malachi Bonds, MD [facility]

## 2021-03-31 NOTE — Telephone Encounter (Signed)
Referral has been placed to the following:  [facility]  [address], [place], [place] [address] P: [phone] F: [phone]  Their office is booked out till mid February which was the soonest any office I contacted had. They do accept the patients insurance also.   I left a detailed voicemail for the patient per DPR.

## 2021-03-31 NOTE — Telephone Encounter (Signed)
Great - thanks!   Marcus Bibian, MD [facility]    

## 2021-04-03 NOTE — Telephone Encounter (Signed)
Patient never called back so I will assume he is not interested in starting therapy and if he decides in future to start Xolair he can reach out to me

## 2021-04-04 NOTE — Telephone Encounter (Signed)
Noted - we will see how he is doing at the next visit.   Salvatore Marvel, MD [facility]

## 2021-04-25 ENCOUNTER — Other Ambulatory Visit: Payer: Self-pay | Admitting: Surgery

## 2021-06-06 ENCOUNTER — Encounter: Payer: Self-pay | Admitting: Allergy & Immunology

## 2021-06-06 ENCOUNTER — Other Ambulatory Visit: Payer: Self-pay

## 2021-06-06 ENCOUNTER — Ambulatory Visit (INDEPENDENT_AMBULATORY_CARE_PROVIDER_SITE_OTHER): Payer: POS | Admitting: Allergy & Immunology

## 2021-06-06 VITALS — BP 134/84 | HR 98 | Temp 98.2°F | Resp 16 | Ht 74.0 in | Wt 246.5 lb

## 2021-06-06 DIAGNOSIS — J302 Other seasonal allergic rhinitis: Secondary | ICD-10-CM

## 2021-06-06 DIAGNOSIS — J3089 Other allergic rhinitis: Secondary | ICD-10-CM

## 2021-06-06 DIAGNOSIS — L500 Allergic urticaria: Secondary | ICD-10-CM

## 2021-06-06 NOTE — Progress Notes (Signed)
? ?FOLLOW UP ? ?Date of Service/Encounter:  06/06/21 ? ? ?Assessment:  ? 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  ?Seasonal and perennial allergic rhinitis (grasses, ragweed, weeds, trees, indoor molds, outdoor molds, dust mites, cat, dog, and cockroach) ? ? ? ?There was some concern for possible T-cell due to his elevated IgE, but the rash seems to have completely resolved with antihistamines alone [place] would have happened if this was cutaneous T-cell lymphoma.  Today he is really concerned with markedly increased nasal congestion.  She is wondering what may have been the way no sprays to help manage this.  We did give him a sample of Ryaltris today to see if this works.  We will get him back in touch with [place] about this.  Otherwise, he is doing very well. ?  ? ?Plan/Recommendations:  ? ?1. Allergic urticaria ?- We will hold off on the dermatology referral since you are doing well.  ?- We can revisit this in the future if needed.  ?- Continue Allegra (fexofenadine) 180 mg once daily to help suppress the hives. ?- Continue Singulair (montelukast) 10mg  once daily to help suppress the hives. ? ?2. Chronic rhinitis (grasses, ragweed, weeds, trees, indoor molds, outdoor molds, dust mites, cat, dog, and cockroach) ?- Continue taking: Allegra and Singulair.  ?- You can use an extra dose of the antihistamine, if needed, for breakthrough symptoms.  ?- Sample of Ryaltris provided to help with your nasal issues (once spray per nostril once daily).  ?- We could allergy shots as a means of long-term control, but these are more effective for the runny nose, congestion, sneezing, itchy eyes, etc. (I am NOT sure that it would help your hives and swelling).  ? ?3. Return in about 6 months (around 12/07/2021).  ? ?Subjective:  ? ?Marcus Wilkins is a 56 y.o. male presenting today for follow up of  ?Chief Complaint  ?Patient presents with  ? Follow-up  ? ? ?56 has a history of the  following: ?Patient Active Problem List  ? Diagnosis Date Noted  ? Allergic urticaria 11/24/2020  ? Seasonal and perennial allergic rhinitis 11/24/2020  ? Hyperlipidemia associated with type 2 diabetes mellitus (HCC) 09/14/2016  ? Hypertension associated with diabetes (HCC) 09/14/2016  ? Serum total bilirubin elevated 06/18/2016  ? Diabetes mellitus without complication (HCC) 06/18/2016  ? BMI 33.0-33.9,adult 05/17/2016  ? Gallstones 05/17/2016  ? Allergic rhinitis due to pollen 01/24/2015  ? Plantar fasciitis 07/28/2013  ? Osteoarthritis of both knees 07/28/2013  ? GERD 12/28/2008  ? ? ?History obtained from: chart review and patient. ? ?Marcus Wilkins is a 56 y.o. male presenting for a follow up visit.  He was last seen in December 2022.  At that time, we obtained labs to look for serious causes of hives.  We continued with Allegra daily as well as Singulair daily.  For his rhinitis, we discontinued with the 2 tablets but did not start a nose spray.  He had several sensitizations, but not much in the way of allergic rhinitis.  ? ?We did talk about starting Xolair, but he never returned calls with Tammy. ? ?We did refer him to [facility]. We really wanted a skin biopsy to rule out cutaneous T cell lymphoma. I never got a chart from them confirming that this was done.  ? ?He says that Dermatology never called him. Rash is better overall. Rash continues to be located on his back. It is sometimes itchy  but sometimes not itchy at all. Overall the rash is better than it was in September. He repfers to hold off.  ? ?Allergic Rhinitis Symptom History: He reports that the head gets tight and it take a "while for [his] head to release ". He tells me that his head "decompresses like a tea kettle" and makes a sound that everyone can hear.  He has never been on allergy shots.  Apparently anyone around him can hear this decompression sounds.  Like to decompress this, he has a lot of postnasal drip and rhinorrhea  anteriorly. ? ?He has been on Flonase and Rhincort. He does not like being over dried in his nasal cavity.  He is not sure whether he has tried Atrovent.  He definitely has never tried Dymista or Ryaltris. He might have been on azelastine at one point.  ? ?Otherwise, there have been no changes to his past medical history, surgical history, family history, or social history. ? ? ? ?Review of Systems  ?Constitutional: Negative.  Negative for fever, malaise/fatigue and weight loss.  ?HENT:  Positive for congestion. Negative for ear discharge and ear pain.   ?Eyes:  Negative for pain, discharge and redness.  ?Respiratory:  Negative for cough, sputum production, shortness of breath and wheezing.   ?Cardiovascular: Negative.  Negative for chest pain and palpitations.  ?Gastrointestinal:  Negative for abdominal pain and heartburn.  ?Skin: Negative.  Negative for itching and rash.  ?Neurological:  Negative for dizziness and headaches.  ?Endo/Heme/Allergies:  Negative for environmental allergies. Does not bruise/bleed easily.   ? ? ? ?Objective:  ? ?Blood pressure 134/84, pulse 98, temperature 98.2 ?F (36.8 ?C), resp. rate 16, height 6\' 2"  (1.88 m), weight 246 lb 8 oz (111.8 kg). ?Body mass index is 31.65 kg/m?. ? ? ? ?Physical Exam ?Vitals reviewed.  ?Constitutional:   ?   Appearance: He is well-developed.  ?   Comments: Pleasant male.  Cooperative with exam.  ?HENT:  ?   Head: Normocephalic and atraumatic.  ?   Right Ear: Tympanic membrane, ear canal and external ear normal. No drainage, swelling or tenderness. Tympanic membrane is not injected, scarred, erythematous, retracted or bulging.  ?   Left Ear: Tympanic membrane, ear canal and external ear normal. No drainage, swelling or tenderness. Tympanic membrane is not injected, scarred, erythematous, retracted or bulging.  ?   Nose: No nasal deformity, septal deviation, mucosal edema or rhinorrhea.  ?   Right Turbinates: Enlarged, swollen and pale.  ?   Left Turbinates:  Enlarged, swollen and pale.  ?   Right Sinus: No maxillary sinus tenderness or frontal sinus tenderness.  ?   Left Sinus: No maxillary sinus tenderness or frontal sinus tenderness.  ?   Comments: No nasal polyps appreciated. ?   Mouth/Throat:  ?   Mouth: Mucous membranes are not pale and not dry.  ?   Pharynx: Uvula midline.  ?   Comments: Cobblestoning present in the posterior oropharynx. ?Eyes:  ?   General:     ?   Right eye: No discharge.     ?   Left eye: No discharge.  ?   Conjunctiva/sclera: Conjunctivae normal.  ?   Right eye: Right conjunctiva is not injected. No chemosis. ?   Left eye: Left conjunctiva is not injected. No chemosis. ?   Pupils: Pupils are equal, round, and reactive to light.  ?Cardiovascular:  ?   Rate and Rhythm: Normal rate and regular rhythm.  ?   Heart  sounds: Normal heart sounds.  ?Pulmonary:  ?   Effort: Pulmonary effort is normal. No tachypnea, accessory muscle usage or respiratory distress.  ?   Breath sounds: Normal breath sounds. No wheezing, rhonchi or rales.  ?   Comments: Moving air well in all lung fields.  No increased work of breathing. ?Chest:  ?   Chest wall: No tenderness.  ?Lymphadenopathy:  ?   Head:  ?   Right side of head: No submandibular, tonsillar or occipital adenopathy.  ?   Left side of head: No submandibular, tonsillar or occipital adenopathy.  ?   Cervical: No cervical adenopathy.  ?Skin: ?   General: Skin is warm.  ?   Capillary Refill: Capillary refill takes less than 2 seconds.  ?   Coloration: Skin is not pale.  ?   Findings: No abrasion, erythema, petechiae or rash. Rash is not papular, urticarial or vesicular.  ?   Comments: No rashes today.  He does report some on his thighs, but I did not look at them today.  ?Neurological:  ?   Mental Status: He is alert.  ?Psychiatric:     ?   Behavior: Behavior is cooperative.  ?  ? ?Diagnostic studies: none ? ? ? ?  ?Malachi BondsJoel Toa Mia, MD  ?[facility] ? ? ? ? ? ? ?

## 2021-06-06 NOTE — Patient Instructions (Addendum)
1. Allergic urticaria ?- We will hold off on the dermatology referral since you are doing well.  ?- We can revisit this in the future if needed.  ?- Continue Allegra (fexofenadine) 180 mg once daily to help suppress the hives. ?- Continue Singulair (montelukast) 10mg  once daily to help suppress the hives. ? ?2. Chronic rhinitis (grasses, ragweed, weeds, trees, indoor molds, outdoor molds, dust mites, cat, dog, and cockroach) ?- Continue taking: Allegra and Singulair.  ?- You can use an extra dose of the antihistamine, if needed, for breakthrough symptoms.  ?- Sample of Ryaltris provided to help with your nasal issues (once spray per nostril once daily).  ?- We could allergy shots as a means of long-term control, but these are more effective for the runny nose, congestion, sneezing, itchy eyes, etc. (I am NOT sure that it would help your hives and swelling).  ? ?3. Return in about 6 months (around 12/07/2021).  ? ? ?Please inform 12/09/2021 of any Emergency Department visits, hospitalizations, or changes in symptoms. Call us before going to the ED for breathing or allergy symptoms since we might be able to fit you in for a sick visit. Feel free to contact us anytime with any questions, problems, or concerns. ? ?It was a pleasure to see you again today! ? ?Websites that have reliable patient information: ?1. American Academy of Asthma, Allergy, and Immunology: www.aaaai.org ?2. Food Allergy Research and Education (FARE): foodallergy.org ?3. Mothers of Asthmatics: http://www.asthmacommunitynetwork.org ?4. [place] of Allergy, Asthma, and Immunology: [employer] ? ? ?COVID-19 Vaccine Information can be found at: [email or web address].ca For questions related to vaccine distribution or appointments, please email [email or web address] or call [phone].  ? ?We realize that you might be concerned about having an allergic reaction to the COVID19 vaccines. To help  with that concern, WE ARE OFFERING THE COVID19 VACCINES IN OUR OFFICE! Ask the front desk for dates!  ? ? ? ??Like? [phone] on Facebook and Instagram for our latest updates!  ?  ? ? ?A healthy democracy works best when [place] participate! Make sure you are registered to vote! If you have moved or changed any of your contact information, you will need to get this updated before voting! ? ?In some cases, you MAY be able to register to vote online: [employer] ? ? ? ? ? ? ? ? ? ?

## 2021-09-15 NOTE — Progress Notes (Signed)
Left VM for Steward Drone at [facility] to let her know patient would like to reschedule surgery.

## 2021-09-18 ENCOUNTER — Encounter (HOSPITAL_COMMUNITY): Payer: Self-pay | Admitting: Anesthesiology

## 2021-09-20 ENCOUNTER — Encounter (HOSPITAL_BASED_OUTPATIENT_CLINIC_OR_DEPARTMENT_OTHER): Admission: RE | Payer: Self-pay | Source: Ambulatory Visit

## 2021-09-20 ENCOUNTER — Ambulatory Visit (HOSPITAL_BASED_OUTPATIENT_CLINIC_OR_DEPARTMENT_OTHER): Admission: RE | Admit: 2021-09-20 | Payer: POS | Source: Ambulatory Visit | Admitting: Surgery

## 2021-09-20 SURGERY — LAPAROSCOPIC CHOLECYSTECTOMY
Anesthesia: General

## 2021-09-27 DIAGNOSIS — M6530 Trigger finger, unspecified finger: Secondary | ICD-10-CM | POA: Insufficient documentation

## 2021-09-27 DIAGNOSIS — M79646 Pain in unspecified finger(s): Secondary | ICD-10-CM | POA: Insufficient documentation

## 2021-11-16 ENCOUNTER — Other Ambulatory Visit: Payer: Self-pay | Admitting: *Deleted

## 2021-11-16 MED ORDER — MONTELUKAST SODIUM 10 MG PO TABS: ORAL_TABLET | ORAL | 0 refills | Status: DC

## 2021-12-07 ENCOUNTER — Ambulatory Visit: Payer: POS | Admitting: Allergy & Immunology

## 2021-12-28 ENCOUNTER — Ambulatory Visit: Payer: POS | Admitting: Allergy & Immunology

## 2022-01-25 ENCOUNTER — Ambulatory Visit (INDEPENDENT_AMBULATORY_CARE_PROVIDER_SITE_OTHER): Payer: POS | Admitting: Allergy & Immunology

## 2022-01-25 ENCOUNTER — Encounter: Payer: Self-pay | Admitting: Allergy & Immunology

## 2022-01-25 ENCOUNTER — Other Ambulatory Visit: Payer: Self-pay

## 2022-01-25 VITALS — BP 130/82 | HR 80 | Temp 98.5°F | Resp 16

## 2022-01-25 DIAGNOSIS — L500 Allergic urticaria: Secondary | ICD-10-CM

## 2022-01-25 DIAGNOSIS — J3089 Other allergic rhinitis: Secondary | ICD-10-CM

## 2022-01-25 MED ORDER — FLUTICASONE PROPIONATE 50 MCG/ACT NA SUSP: 2.0000 | Freq: Every day | NASAL | 5 refills | Status: DC

## 2022-01-25 MED ORDER — MONTELUKAST SODIUM 10 MG PO TABS
ORAL_TABLET | ORAL | 5 refills | Status: DC
Start: 2022-01-25 — End: 2022-01-31

## 2022-01-25 NOTE — Patient Instructions (Addendum)
1. Allergic urticaria - Stop the montelukast completely and see how you do.  - You can always restart the montelukast.   2. Chronic rhinitis (grasses, ragweed, weeds, trees, indoor molds, outdoor molds, dust mites, cat, dog, and cockroach) - Start taking; Flonase one spray per nostril daily as needed (try getting that at [employer] can use an extra dose of the antihistamine, if needed, for breakthrough symptoms.  - We could allergy shots as a means of long-term control, but these are more effective for the runny nose, congestion, sneezing, itchy eyes, etc.  3. Return in about 1 year (around 01/26/2023).    Please inform us of any Emergency Department visits, hospitalizations, or changes in symptoms. Call us before going to the ED for breathing or allergy symptoms since we might be able to fit you in for a sick visit. Feel free to contact us anytime with any questions, problems, or concerns.  It was a pleasure to see you again today!  Websites that have reliable patient information: 1. American Academy of Asthma, Allergy, and Immunology: www.aaaai.org 2. Food Allergy Research and Education (FARE): foodallergy.org 3. Mothers of Asthmatics: http://www.asthmacommunitynetwork.org 4. American College of Allergy, Asthma, and Immunology: www.acaai.org   COVID-19 Vaccine Information can be found at: ShippingScam.co.uk For questions related to vaccine distribution or appointments, please email [email or web address] or call [phone].   We realize that you might be concerned about having an allergic reaction to the COVID19 vaccines. To help with that concern, WE ARE OFFERING THE COVID19 VACCINES IN OUR OFFICE! Ask the front desk for dates!     "Like" [place] on Facebook and Instagram for our latest updates!      A healthy democracy works best when [employer] participate! Make sure you are registered to vote! If you have moved or changed  any of your contact information, you will need to get this updated before voting!  In some cases, you MAY be able to register to vote online: [email or web address]

## 2022-01-25 NOTE — Progress Notes (Signed)
FOLLOW UP  Date of Service/Encounter:  01/25/22   Assessment:   Allergic urticaria - with labs normal aside from markedly elevated IgE   Seasonal and perennial allergic rhinitis (grasses, ragweed, weeds, trees, indoor molds, outdoor molds, dust mites, cat, dog, and cockroach)  Plan/Recommendations:   1. Allergic urticaria - Stop the montelukast completely and see how you do.  - You can always restart the montelukast.   2. Chronic rhinitis (grasses, ragweed, weeds, trees, indoor molds, outdoor molds, dust mites, cat, dog, and cockroach) - Start taking; Flonase one spray per nostril daily as needed (try getting that at [employer] can use an extra dose of the antihistamine, if needed, for breakthrough symptoms.  - We could allergy shots as a means of long-term control, but these are more effective for the runny nose, congestion, sneezing, itchy eyes, etc.  3. Return in about 1 year (around 01/26/2023).    Subjective:   Marcus Wilkins is a 56 y.o. male presenting today for follow up of  Chief Complaint  Patient presents with  . Urticaria    Marcus Wilkins has a history of the following: Patient Active Problem List   Diagnosis Date Noted  . Allergic urticaria 11/24/2020  . Seasonal and perennial allergic rhinitis 11/24/2020  . Hyperlipidemia associated with type 2 diabetes mellitus ([place]) 09/14/2016  . Hypertension associated with diabetes ([place]) 09/14/2016  . Serum total bilirubin elevated 06/18/2016  . Diabetes mellitus without complication ([place]) 00/[id number]/8182  . BMI 33.0-33.9,adult 05/17/2016  . Gallstones 05/17/2016  . Allergic rhinitis due to pollen 01/24/2015  . Plantar fasciitis 07/28/2013  . Osteoarthritis of both knees 07/28/2013  . GERD 12/28/2008    History obtained from: chart review and {Persons; PED relatives w/patient:19415::"patient"}.  Marcus Wilkins is a 56 y.o. male presenting for {Blank single:19197::"a food challenge","a drug challenge","skin testing","a sick  visit","an evaluation of ***","a follow up visit"}.  He was last seen in March 2023.  At that time, he was having a rash that was concerning for cutaneous T-cell lymphoma.  But the rash is cleared up with antihistamines alone.  We held off on the dermatology referral and continued with Allegra and Singulair.  Allergic rhinitis was controlled with the antihistamines as well as Ryaltris.   Since last visit, he has done well. He remains on the montelukast. The last time that he saw the rash was a while ago. He is doing the montelukast three times weekly or so.   {Blank single:19197::"Asthma/Respiratory Symptom History: ***"," "}  Allergic Rhinitis Symptom History: He is having a lot of congestion and sinus pain and pressure. He estimates that he has congestion every two months or so. He did try the Ryaltris, but the one thing that he did not like about it was the postnasal drip sensation. Prior to that, he did something like Rhincort. He is not usre that this did much of anything.   {Blank single:19197::"Food Allergy Symptom History: ***"," "}  {Blank single:19197::"Skin Symptom History: ***"," "}  {Blank single:19197::"GERD Symptom History: ***"," "}  He is traveling to [place] and then [place] as well. He lost his mother in early June. She was 77. He lost his Dad secondary to [place].   Otherwise, there have been no changes to his past medical history, surgical history, family history, or social history.    ROS     Objective:   Blood pressure 130/82, pulse 80, temperature 98.5 F (36.9 C), temperature source Temporal, resp. rate 16, SpO2 98 %. There  is no height or weight on file to calculate BMI.    Physical Exam   Diagnostic studies: {Blank single:19197::"none","deferred due to recent antihistamine use","labs sent instead"," "}  Spirometry: {Blank single:19197::"results normal (FEV1: ***%, FVC: ***%, FEV1/FVC: ***%)","results abnormal (FEV1: ***%, FVC: ***%, FEV1/FVC: ***%)"}.     {Blank single:19197::"Spirometry consistent with mild obstructive disease","Spirometry consistent with moderate obstructive disease","Spirometry consistent with severe obstructive disease","Spirometry consistent with possible restrictive disease","Spirometry consistent with mixed obstructive and restrictive disease","Spirometry uninterpretable due to technique","Spirometry consistent with normal pattern"}. {Blank single:19197::"Albuterol/Atrovent nebulizer","Xopenex/Atrovent nebulizer","Albuterol nebulizer","Albuterol four puffs via MDI","Xopenex four puffs via MDI"} treatment given in clinic with {Blank single:19197::"significant improvement in FEV1 per ATS criteria","significant improvement in FVC per ATS criteria","significant improvement in FEV1 and FVC per ATS criteria","improvement in FEV1, but not significant per ATS criteria","improvement in FVC, but not significant per ATS criteria","improvement in FEV1 and FVC, but not significant per ATS criteria","no improvement"}.  Allergy Studies: {Blank single:19197::"none","labs sent instead"," "}    {Blank single:19197::"Allergy testing results were read and interpreted by myself, documented by clinical staff."," "}      Malachi Bonds, MD  [facility]

## 2022-01-31 ENCOUNTER — Other Ambulatory Visit: Payer: Self-pay

## 2022-01-31 MED ORDER — MONTELUKAST SODIUM 10 MG PO TABS: ORAL_TABLET | ORAL | 11 refills | Status: DC

## 2022-06-16 ENCOUNTER — Encounter (HOSPITAL_BASED_OUTPATIENT_CLINIC_OR_DEPARTMENT_OTHER): Payer: Self-pay | Admitting: Emergency Medicine

## 2022-06-16 ENCOUNTER — Inpatient Hospital Stay (HOSPITAL_COMMUNITY): Payer: 59 | Admitting: Certified Registered"

## 2022-06-16 ENCOUNTER — Other Ambulatory Visit: Payer: Self-pay

## 2022-06-16 ENCOUNTER — Emergency Department (HOSPITAL_BASED_OUTPATIENT_CLINIC_OR_DEPARTMENT_OTHER): Payer: 59

## 2022-06-16 ENCOUNTER — Observation Stay (HOSPITAL_BASED_OUTPATIENT_CLINIC_OR_DEPARTMENT_OTHER)
Admission: EM | Admit: 2022-06-16 | Discharge: 2022-06-17 | Disposition: A | Discharge status: Home / Self Care | Payer: 59 | Attending: General Surgery | Admitting: General Surgery

## 2022-06-16 ENCOUNTER — Encounter (HOSPITAL_COMMUNITY)
Admission: EM | Disposition: A | Discharge status: Home / Self Care | Payer: Self-pay | Source: Home / Self Care | Attending: Emergency Medicine

## 2022-06-16 DIAGNOSIS — R1011 Right upper quadrant pain: Secondary | ICD-10-CM | POA: Diagnosis present

## 2022-06-16 DIAGNOSIS — Z79899 Other long term (current) drug therapy: Secondary | ICD-10-CM | POA: Diagnosis not present

## 2022-06-16 DIAGNOSIS — E119 Type 2 diabetes mellitus without complications: Secondary | ICD-10-CM | POA: Insufficient documentation

## 2022-06-16 DIAGNOSIS — K8010 Calculus of gallbladder with chronic cholecystitis without obstruction: Secondary | ICD-10-CM

## 2022-06-16 DIAGNOSIS — K8012 Calculus of gallbladder with acute and chronic cholecystitis without obstruction: Principal | ICD-10-CM | POA: Insufficient documentation

## 2022-06-16 DIAGNOSIS — K8000 Calculus of gallbladder with acute cholecystitis without obstruction: Secondary | ICD-10-CM | POA: Diagnosis present

## 2022-06-16 DIAGNOSIS — K810 Acute cholecystitis: Principal | ICD-10-CM | POA: Diagnosis present

## 2022-06-16 HISTORY — DX: Type 2 diabetes mellitus without complications: E11.9

## 2022-06-16 HISTORY — PX: CHOLECYSTECTOMY: SHX55

## 2022-06-16 LAB — GLUCOSE, CAPILLARY
Glucose-Capillary: 205 mg/dL — ABNORMAL HIGH (ref 70–99)
Glucose-Capillary: 188 mg/dL — ABNORMAL HIGH (ref 70–99)
Glucose-Capillary: 231 mg/dL — ABNORMAL HIGH (ref 70–99)
Glucose-Capillary: 244 mg/dL — ABNORMAL HIGH (ref 70–99)
Glucose-Capillary: 242 mg/dL — ABNORMAL HIGH (ref 70–99)

## 2022-06-16 LAB — COMPREHENSIVE METABOLIC PANEL
ALT: 22 U/L (ref 0–44)
AST: 15 U/L (ref 15–41)
Albumin: 4.2 g/dL (ref 3.5–5.0)
Alkaline Phosphatase: 78 U/L (ref 38–126)
Anion gap: 10 (ref 5–15)
BUN: 10 mg/dL (ref 6–20)
CO2: 25 mmol/L (ref 22–32)
Calcium: 9.7 mg/dL (ref 8.9–10.3)
Chloride: 101 mmol/L (ref 98–111)
Creatinine, Ser: 0.86 mg/dL (ref 0.61–1.24)
GFR, Estimated: >60 mL/min (ref >60)
Glucose, Bld: 239 mg/dL — ABNORMAL HIGH (ref 70–99)
Potassium: 4 mmol/L (ref 3.5–5.1)
Sodium: 136 mmol/L (ref 135–145)
Total Bilirubin: 2 mg/dL — ABNORMAL HIGH (ref 0.3–1.2)
Total Protein: 7.8 g/dL (ref 6.5–8.1)

## 2022-06-16 LAB — CBC
HCT: 44 % (ref 39.0–52.0)
Hemoglobin: 14.6 g/dL (ref 13.0–17.0)
MCH: 28.5 pg (ref 26.0–34.0)
MCHC: 33.2 g/dL (ref 30.0–36.0)
MCV: 85.9 fL (ref 80.0–100.0)
Platelets: 184 10*3/uL (ref 150–400)
RBC: 5.12 MIL/uL (ref 4.22–5.81)
RDW: 14 % (ref 11.5–15.5)
WBC: 16.8 10*3/uL — ABNORMAL HIGH (ref 4.0–10.5)
nRBC: 0 % (ref 0.0–0.2)

## 2022-06-16 LAB — CBC WITH DIFFERENTIAL/PLATELET
Abs Immature Granulocytes: 0.04 10*3/uL (ref 0.00–0.07)
Basophils Absolute: 0 10*3/uL (ref 0.0–0.1)
Basophils Relative: 0 %
Eosinophils Absolute: 0.1 10*3/uL (ref 0.0–0.5)
Eosinophils Relative: 1 %
HCT: 42.4 % (ref 39.0–52.0)
Hemoglobin: 14.4 g/dL (ref 13.0–17.0)
Immature Granulocytes: 0 %
Lymphocytes Relative: 9 %
Lymphs Abs: 1.4 10*3/uL (ref 0.7–4.0)
MCH: 29.1 pg (ref 26.0–34.0)
MCHC: 34 g/dL (ref 30.0–36.0)
MCV: 85.7 fL (ref 80.0–100.0)
Monocytes Absolute: 1 10*3/uL (ref 0.1–1.0)
Monocytes Relative: 7 %
Neutro Abs: 12.2 10*3/uL — ABNORMAL HIGH (ref 1.7–7.7)
Neutrophils Relative %: 83 %
Platelets: 171 10*3/uL (ref 150–400)
RBC: 4.95 MIL/uL (ref 4.22–5.81)
RDW: 14 % (ref 11.5–15.5)
WBC: 14.8 10*3/uL — ABNORMAL HIGH (ref 4.0–10.5)
nRBC: 0 % (ref 0.0–0.2)

## 2022-06-16 LAB — LIPASE, BLOOD: Lipase: 17 U/L (ref 11–51)

## 2022-06-16 LAB — HIV ANTIBODY (ROUTINE TESTING W REFLEX): HIV Screen 4th Generation wRfx: NONREACTIVE

## 2022-06-16 LAB — CREATININE, SERUM
Creatinine, Ser: 0.97 mg/dL (ref 0.61–1.24)
GFR, Estimated: >60 mL/min (ref >60)

## 2022-06-16 SURGERY — LAPAROSCOPIC CHOLECYSTECTOMY
Anesthesia: General

## 2022-06-16 MED ORDER — LACTATED RINGERS IV SOLN: INTRAVENOUS | Status: DC

## 2022-06-16 MED ORDER — ONDANSETRON 4 MG PO TBDP: 4.0000 mg | ORAL_TABLET | Freq: Four times a day (QID) | ORAL | Status: DC | PRN

## 2022-06-16 MED ORDER — PIPERACILLIN-TAZOBACTAM 3.375 G IVPB
3.3750 g | Freq: Three times a day (TID) | INTRAVENOUS | Status: DC
  Administered 2022-06-16: 3.375 g via INTRAVENOUS
  Filled 2022-06-16: qty 50

## 2022-06-16 MED ORDER — DIPHENHYDRAMINE HCL 50 MG/ML IJ SOLN: 25.0000 mg | Freq: Four times a day (QID) | INTRAMUSCULAR | Status: DC | PRN

## 2022-06-16 MED ORDER — MIDAZOLAM HCL 2 MG/2ML IJ SOLN
INTRAMUSCULAR | Status: AC
  Filled 2022-06-16: qty 2

## 2022-06-16 MED ORDER — ESMOLOL HCL 100 MG/10ML IV SOLN
INTRAVENOUS | Status: AC
  Filled 2022-06-16: qty 10

## 2022-06-16 MED ORDER — HYDRALAZINE HCL 20 MG/ML IJ SOLN: 10.0000 mg | INTRAMUSCULAR | Status: DC | PRN

## 2022-06-16 MED ORDER — PHENYLEPHRINE HCL-NACL 20-0.9 MG/250ML-% IV SOLN
INTRAVENOUS | Status: DC | PRN
  Administered 2022-06-16: 25 ug/min via INTRAVENOUS

## 2022-06-16 MED ORDER — FENTANYL CITRATE (PF) 100 MCG/2ML IJ SOLN: 25.0000 ug | INTRAMUSCULAR | Status: DC | PRN

## 2022-06-16 MED ORDER — CHLORHEXIDINE GLUCONATE 0.12 % MT SOLN
OROMUCOSAL | Status: AC
  Filled 2022-06-16: qty 15

## 2022-06-16 MED ORDER — MORPHINE SULFATE (PF) 2 MG/ML IV SOLN: 1.0000 mg | INTRAVENOUS | Status: DC | PRN

## 2022-06-16 MED ORDER — BUPIVACAINE HCL (PF) 0.25 % IJ SOLN
INTRAMUSCULAR | Status: AC
  Filled 2022-06-16: qty 30

## 2022-06-16 MED ORDER — PROPOFOL 10 MG/ML IV BOLUS
INTRAVENOUS | Status: DC | PRN
  Administered 2022-06-16: 150 mg via INTRAVENOUS
  Administered 2022-06-16: 50 mg via INTRAVENOUS

## 2022-06-16 MED ORDER — ROCURONIUM BROMIDE 10 MG/ML (PF) SYRINGE
PREFILLED_SYRINGE | INTRAVENOUS | Status: AC
  Filled 2022-06-16: qty 10

## 2022-06-16 MED ORDER — METOPROLOL TARTRATE 5 MG/5ML IV SOLN: 5.0000 mg | Freq: Four times a day (QID) | INTRAVENOUS | Status: DC | PRN

## 2022-06-16 MED ORDER — ENOXAPARIN SODIUM 40 MG/0.4ML IJ SOSY: 40.0000 mg | PREFILLED_SYRINGE | INTRAMUSCULAR | Status: DC

## 2022-06-16 MED ORDER — DIPHENHYDRAMINE HCL 25 MG PO CAPS: 25.0000 mg | ORAL_CAPSULE | Freq: Four times a day (QID) | ORAL | Status: DC | PRN

## 2022-06-16 MED ORDER — FENTANYL CITRATE PF 50 MCG/ML IJ SOSY
50.0000 ug | PREFILLED_SYRINGE | INTRAMUSCULAR | Status: DC | PRN
  Administered 2022-06-16: 50 ug via INTRAVENOUS
  Filled 2022-06-16: qty 1

## 2022-06-16 MED ORDER — ONDANSETRON HCL 4 MG/2ML IJ SOLN
INTRAMUSCULAR | Status: DC | PRN
  Administered 2022-06-16: 4 mg via INTRAVENOUS

## 2022-06-16 MED ORDER — ACETAMINOPHEN 325 MG PO TABS: 650.0000 mg | ORAL_TABLET | Freq: Four times a day (QID) | ORAL | Status: DC | PRN

## 2022-06-16 MED ORDER — INSULIN ASPART 100 UNIT/ML IJ SOLN: 0.0000 [IU] | INTRAMUSCULAR | Status: DC

## 2022-06-16 MED ORDER — MIDAZOLAM HCL 2 MG/2ML IJ SOLN
INTRAMUSCULAR | Status: DC | PRN
  Administered 2022-06-16: 2 mg via INTRAVENOUS

## 2022-06-16 MED ORDER — LIDOCAINE 2% (20 MG/ML) 5 ML SYRINGE
INTRAMUSCULAR | Status: DC | PRN
  Administered 2022-06-16: 100 mg via INTRAVENOUS

## 2022-06-16 MED ORDER — INSULIN ASPART 100 UNIT/ML IJ SOLN: 5.0000 [IU] | Freq: Once | INTRAMUSCULAR | Status: DC

## 2022-06-16 MED ORDER — 0.9 % SODIUM CHLORIDE (POUR BTL) OPTIME
TOPICAL | Status: DC | PRN
  Administered 2022-06-16: 1000 mL

## 2022-06-16 MED ORDER — SODIUM CHLORIDE 0.9 % IV SOLN: Freq: Once | INTRAVENOUS | Status: AC

## 2022-06-16 MED ORDER — INDOCYANINE GREEN 25 MG IV SOLR
INTRAVENOUS | Status: AC
  Filled 2022-06-16: qty 10

## 2022-06-16 MED ORDER — FENTANYL CITRATE (PF) 250 MCG/5ML IJ SOLN
INTRAMUSCULAR | Status: AC
  Filled 2022-06-16: qty 5

## 2022-06-16 MED ORDER — FENTANYL CITRATE PF 50 MCG/ML IJ SOSY
100.0000 ug | PREFILLED_SYRINGE | Freq: Once | INTRAMUSCULAR | Status: AC
  Administered 2022-06-16: 100 ug via INTRAVENOUS
  Filled 2022-06-16: qty 2

## 2022-06-16 MED ORDER — PANTOPRAZOLE SODIUM 40 MG IV SOLR
40.0000 mg | Freq: Every day | INTRAVENOUS | Status: DC
  Administered 2022-06-16: 40 mg via INTRAVENOUS
  Filled 2022-06-16: qty 10

## 2022-06-16 MED ORDER — SUGAMMADEX SODIUM 200 MG/2ML IV SOLN
INTRAVENOUS | Status: DC | PRN
  Administered 2022-06-16: 208 mg via INTRAVENOUS

## 2022-06-16 MED ORDER — ROCURONIUM BROMIDE 10 MG/ML (PF) SYRINGE
PREFILLED_SYRINGE | INTRAVENOUS | Status: DC | PRN
  Administered 2022-06-16: 60 mg via INTRAVENOUS
  Administered 2022-06-16 (×2): 20 mg via INTRAVENOUS
  Administered 2022-06-16 (×4): 10 mg via INTRAVENOUS

## 2022-06-16 MED ORDER — LOSARTAN POTASSIUM 50 MG PO TABS
50.0000 mg | ORAL_TABLET | Freq: Every day | ORAL | Status: DC
  Administered 2022-06-16 – 2022-06-17 (×2): 50 mg via ORAL
  Filled 2022-06-16 (×2): qty 1

## 2022-06-16 MED ORDER — ORAL CARE MOUTH RINSE: 15.0000 mL | Freq: Once | OROMUCOSAL | Status: DC

## 2022-06-16 MED ORDER — PROPOFOL 10 MG/ML IV BOLUS
INTRAVENOUS | Status: AC
  Filled 2022-06-16: qty 20

## 2022-06-16 MED ORDER — INSULIN ASPART 100 UNIT/ML IJ SOLN
0.0000 [IU] | INTRAMUSCULAR | Status: AC | PRN
  Administered 2022-06-16 (×2): 4 [IU] via SUBCUTANEOUS

## 2022-06-16 MED ORDER — PIPERACILLIN-TAZOBACTAM 3.375 G IVPB
3.3750 g | Freq: Three times a day (TID) | INTRAVENOUS | Status: AC
  Administered 2022-06-16 – 2022-06-17 (×2): 3.375 g via INTRAVENOUS
  Filled 2022-06-16 (×2): qty 50

## 2022-06-16 MED ORDER — CHLORHEXIDINE GLUCONATE 0.12 % MT SOLN: 15.0000 mL | Freq: Once | OROMUCOSAL | Status: DC

## 2022-06-16 MED ORDER — ONDANSETRON HCL 4 MG/2ML IJ SOLN
INTRAMUSCULAR | Status: AC
  Filled 2022-06-16: qty 2

## 2022-06-16 MED ORDER — DEXAMETHASONE SODIUM PHOSPHATE 10 MG/ML IJ SOLN
INTRAMUSCULAR | Status: DC | PRN
  Administered 2022-06-16: 8 mg via INTRAVENOUS

## 2022-06-16 MED ORDER — FENTANYL CITRATE (PF) 250 MCG/5ML IJ SOLN
INTRAMUSCULAR | Status: DC | PRN
  Administered 2022-06-16 (×4): 50 ug via INTRAVENOUS
  Administered 2022-06-16: 100 ug via INTRAVENOUS

## 2022-06-16 MED ORDER — ONDANSETRON HCL 4 MG/2ML IJ SOLN: 4.0000 mg | Freq: Four times a day (QID) | INTRAMUSCULAR | Status: DC | PRN

## 2022-06-16 MED ORDER — INSULIN ASPART 100 UNIT/ML IJ SOLN
0.0000 [IU] | Freq: Three times a day (TID) | INTRAMUSCULAR | Status: DC
  Administered 2022-06-16: 5 [IU] via SUBCUTANEOUS
  Administered 2022-06-17: 2 [IU] via SUBCUTANEOUS
  Administered 2022-06-17: 3 [IU] via SUBCUTANEOUS

## 2022-06-16 MED ORDER — INDOCYANINE GREEN 25 MG IV SOLR
2.5000 mg | INTRAVENOUS | Status: DC
  Filled 2022-06-16: qty 1

## 2022-06-16 MED ORDER — ACETAMINOPHEN 650 MG RE SUPP: 650.0000 mg | Freq: Four times a day (QID) | RECTAL | Status: DC | PRN

## 2022-06-16 MED ORDER — ONDANSETRON HCL 4 MG/2ML IJ SOLN
4.0000 mg | Freq: Once | INTRAMUSCULAR | Status: AC
  Administered 2022-06-16: 4 mg via INTRAVENOUS
  Filled 2022-06-16: qty 2

## 2022-06-16 MED ORDER — SIMETHICONE 80 MG PO CHEW
40.0000 mg | CHEWABLE_TABLET | Freq: Four times a day (QID) | ORAL | Status: DC | PRN
  Administered 2022-06-16: 40 mg via ORAL
  Filled 2022-06-16: qty 1

## 2022-06-16 MED ORDER — FENTANYL CITRATE PF 50 MCG/ML IJ SOSY: 100.0000 ug | PREFILLED_SYRINGE | INTRAMUSCULAR | Status: DC | PRN

## 2022-06-16 MED ORDER — ENOXAPARIN SODIUM 40 MG/0.4ML IJ SOSY
40.0000 mg | PREFILLED_SYRINGE | INTRAMUSCULAR | Status: DC
  Administered 2022-06-17: 40 mg via SUBCUTANEOUS
  Filled 2022-06-16: qty 0.4

## 2022-06-16 MED ORDER — INDOCYANINE GREEN 25 MG IV SOLR
INTRAVENOUS | Status: DC | PRN
  Administered 2022-06-16: 2.5 mg via INTRAVENOUS

## 2022-06-16 MED ORDER — ACETAMINOPHEN 10 MG/ML IV SOLN
1000.0000 mg | Freq: Once | INTRAVENOUS | Status: DC | PRN
  Administered 2022-06-16: 1000 mg via INTRAVENOUS

## 2022-06-16 MED ORDER — ACETAMINOPHEN 10 MG/ML IV SOLN
INTRAVENOUS | Status: AC
  Filled 2022-06-16: qty 100

## 2022-06-16 MED ORDER — OXYCODONE HCL 5 MG PO TABS
5.0000 mg | ORAL_TABLET | ORAL | Status: DC | PRN
  Administered 2022-06-17: 5 mg via ORAL
  Filled 2022-06-16: qty 1

## 2022-06-16 MED ORDER — MORPHINE SULFATE (PF) 2 MG/ML IV SOLN
2.0000 mg | INTRAVENOUS | Status: DC | PRN
  Administered 2022-06-16: 2 mg via INTRAVENOUS
  Filled 2022-06-16: qty 1

## 2022-06-16 MED ORDER — ESMOLOL HCL 100 MG/10ML IV SOLN
INTRAVENOUS | Status: DC | PRN
  Administered 2022-06-16 (×3): 10 mg via INTRAVENOUS

## 2022-06-16 MED ORDER — IOHEXOL 300 MG/ML  SOLN
100.0000 mL | Freq: Once | INTRAMUSCULAR | Status: AC | PRN
  Administered 2022-06-16: 100 mL via INTRAVENOUS

## 2022-06-16 MED ORDER — PIPERACILLIN-TAZOBACTAM 3.375 G IVPB 30 MIN
3.3750 g | Freq: Once | INTRAVENOUS | Status: AC
  Administered 2022-06-16: 3.375 g via INTRAVENOUS
  Filled 2022-06-16: qty 50

## 2022-06-16 MED ORDER — BUPIVACAINE HCL (PF) 0.25 % IJ SOLN
INTRAMUSCULAR | Status: DC | PRN
  Administered 2022-06-16: 8 mL

## 2022-06-16 MED ORDER — ACETAMINOPHEN 500 MG PO TABS
1000.0000 mg | ORAL_TABLET | Freq: Four times a day (QID) | ORAL | Status: DC
  Administered 2022-06-16 – 2022-06-17 (×3): 1000 mg via ORAL
  Filled 2022-06-16 (×4): qty 2

## 2022-06-16 MED ORDER — OXYCODONE HCL 5 MG PO TABS: 5.0000 mg | ORAL_TABLET | ORAL | Status: DC | PRN

## 2022-06-16 MED ORDER — SODIUM CHLORIDE 0.9 % IV SOLN: INTRAVENOUS | Status: DC

## 2022-06-16 MED ORDER — SODIUM CHLORIDE 0.9 % IR SOLN
Status: DC | PRN
  Administered 2022-06-16: 1000 mL

## 2022-06-16 SURGICAL SUPPLY — 53 items
APL PRP STRL LF DISP 70% ISPRP (MISCELLANEOUS) ×1
APL SKNCLS STERI-STRIP NONHPOA (GAUZE/BANDAGES/DRESSINGS) ×1
APPLIER CLIP 5 13 M/L LIGAMAX5 (MISCELLANEOUS) ×2
APR CLP MED LRG 5 ANG JAW (MISCELLANEOUS) ×2
BAG COUNTER SPONGE SURGICOUNT (BAG) ×1 IMPLANT
BAG SPEC RTRVL 10 TROC 200 (ENDOMECHANICALS) ×1
BAG SPNG CNTER NS LX DISP (BAG) ×1
BENZOIN TINCTURE PRP APPL 2/3 (GAUZE/BANDAGES/DRESSINGS) ×1 IMPLANT
BLADE CLIPPER SURG (BLADE) IMPLANT
BNDG ADH 1X3 SHEER STRL LF (GAUZE/BANDAGES/DRESSINGS) ×3 IMPLANT
BNDG ADH THN 3X1 STRL LF (GAUZE/BANDAGES/DRESSINGS) ×3
CANISTER SUCT 3000ML PPV (MISCELLANEOUS) ×1 IMPLANT
CHLORAPREP W/TINT 26 (MISCELLANEOUS) ×1 IMPLANT
CLIP APPLIE 5 13 M/L LIGAMAX5 (MISCELLANEOUS) ×1 IMPLANT
COVER SURGICAL LIGHT HANDLE (MISCELLANEOUS) ×1 IMPLANT
DRSG OPSITE POSTOP 4X6 (GAUZE/BANDAGES/DRESSINGS) IMPLANT
DRSG TEGADERM 2-3/8X2-3/4 SM (GAUZE/BANDAGES/DRESSINGS) IMPLANT
ELECT REM PT RETURN 9FT ADLT (ELECTROSURGICAL) ×1
ELECTRODE REM PT RTRN 9FT ADLT (ELECTROSURGICAL) ×1 IMPLANT
GAUZE SPONGE 2X2 8PLY STRL LF (GAUZE/BANDAGES/DRESSINGS) ×1 IMPLANT
GAUZE SPONGE 2X2 STRL 8-PLY (GAUZE/BANDAGES/DRESSINGS) IMPLANT
GLOVE BIOGEL M STRL SZ7.5 (GLOVE) ×1 IMPLANT
GLOVE INDICATOR 8.0 STRL GRN (GLOVE) ×2 IMPLANT
GOWN STRL REUS W/ TWL LRG LVL3 (GOWN DISPOSABLE) ×2 IMPLANT
GOWN STRL REUS W/ TWL XL LVL3 (GOWN DISPOSABLE) ×1 IMPLANT
GOWN STRL REUS W/TWL LRG LVL3 (GOWN DISPOSABLE) ×2
GOWN STRL REUS W/TWL XL LVL3 (GOWN DISPOSABLE) ×1
GRASPER SUT TROCAR 14GX15 (MISCELLANEOUS) IMPLANT
HEMOSTAT SNOW SURGICEL 2X4 (HEMOSTASIS) IMPLANT
IRRIG SUCT STRYKERFLOW 2 WTIP (MISCELLANEOUS) ×1
IRRIGATION SUCT STRKRFLW 2 WTP (MISCELLANEOUS) ×1 IMPLANT
KIT BASIN OR (CUSTOM PROCEDURE TRAY) ×1 IMPLANT
KIT TURNOVER KIT B (KITS) ×1 IMPLANT
NS IRRIG 1000ML POUR BTL (IV SOLUTION) ×1 IMPLANT
PAD ARMBOARD 7.5X6 YLW CONV (MISCELLANEOUS) ×1 IMPLANT
POUCH RETRIEVAL ECOSAC 10 (ENDOMECHANICALS) ×1 IMPLANT
POUCH RETRIEVAL ECOSAC 10MM (ENDOMECHANICALS) ×1
SCISSORS LAP 5X35 DISP (ENDOMECHANICALS) ×1 IMPLANT
SET TUBE SMOKE EVAC HIGH FLOW (TUBING) ×1 IMPLANT
SLEEVE Z-THREAD 5X100MM (TROCAR) ×2 IMPLANT
SPECIMEN JAR SMALL (MISCELLANEOUS) ×1 IMPLANT
SUT MNCRL AB 4-0 PS2 18 (SUTURE) ×1 IMPLANT
SUT VIC AB 0 UR5 27 (SUTURE) IMPLANT
SUT VICRYL 0 TIES 12 18 (SUTURE) IMPLANT
SUT VICRYL 0 UR6 27IN ABS (SUTURE) IMPLANT
TAPE STRIPS DRAPE STRL (GAUZE/BANDAGES/DRESSINGS) IMPLANT
TOWEL GREEN STERILE (TOWEL DISPOSABLE) ×1 IMPLANT
TOWEL GREEN STERILE FF (TOWEL DISPOSABLE) ×1 IMPLANT
TRAY LAPAROSCOPIC MC (CUSTOM PROCEDURE TRAY) ×1 IMPLANT
TROCAR BALLN 12MMX100 BLUNT (TROCAR) ×1 IMPLANT
TROCAR Z-THREAD OPTICAL 5X100M (TROCAR) ×1 IMPLANT
WARMER LAPAROSCOPE (MISCELLANEOUS) ×1 IMPLANT
WATER STERILE IRR 1000ML POUR (IV SOLUTION) ×1 IMPLANT

## 2022-06-16 NOTE — Op Note (Signed)
PONG PAYE [id number] 01/08/1966 06/16/2022  Laparoscopic Cholecystectomy with near infrared fluorescent cholangiography procedure Note  Indications: This patient presents with symptomatic gallbladder disease and will undergo laparoscopic cholecystectomy.  Patient has been having episodes of gallbladder attacks and had actually seen one of our surgeons last January 2023.  He presented over night with persistent upper abdominal pain.  Imaging, labs and exam was consistent with cholecystitis.  He had a very large gallstone measuring up to 5 cm.  Pre-operative Diagnosis: acute calculous cholecystitis  Post-operative Diagnosis: Same  Surgeon: Greer Pickerel MD FACS  Assistants: Armandina Gemma MD FACS Circulator: Josem Kaufmann, RN Relief Circulator: Clovis Fredrickson, RN Scrub Person: Candi Leash, RN; Natalia Leatherwood, RN RN First Assistant: Tildon Husky, RN   Anesthesia: General endotracheal anesthesia   Procedure Details  The patient was seen again in the Holding Room. The risks, benefits, complications, treatment options, and expected outcomes were discussed with the patient. The possibilities of reaction to medication, pulmonary aspiration, perforation of viscus, bleeding, recurrent infection, finding a normal gallbladder, the need for additional procedures, failure to diagnose a condition, the possible need to convert to an open procedure, and creating a complication requiring transfusion or operation were discussed with the patient. The likelihood of improving the patient's symptoms with return to their baseline status is good.  The patient and/or family concurred with the proposed plan, giving informed consent. The site of surgery properly noted. The patient was taken to Operating Room, identified as Marcus Wilkins and the procedure verified as Laparoscopic Cholecystectomy with ICG dye.  A Time Out was held and the above information confirmed. Antibiotic prophylaxis was  administered.    ICG dye was administered preoperatively.    General endotracheal anesthesia was then administered and tolerated well. After the induction, the abdomen was prepped with Chloraprep and draped in the sterile fashion. The patient was positioned in the supine position.  Local anesthetic agent was injected into the skin near the umbilicus and an incision made. We dissected down to the abdominal fascia with blunt dissection.  The fascia was incised vertically and we entered the peritoneal cavity bluntly.  A pursestring suture of 0-Vicryl was placed around the fascial opening.  The Hasson cannula was inserted and secured with the stay suture.  Pneumoperitoneum was then created with CO2 and tolerated well without any adverse changes in the patient's vital signs. An 5-mm port was placed in the subxiphoid position.  Two 5-mm ports were placed in the right upper quadrant. All skin incisions were infiltrated with a local anesthetic agent before making the incision and placing the trocars.   We positioned the patient in reverse Trendelenburg, tilted slightly to the patient's left.  There was omentum mildly adhered to the gallbladder which was peeled away.  The gallbladder was identified.  The gallbladder was distended.  In order to facilitate retraction I aspirated the gallbladder.  The fundus grasped and retracted cephalad. Adhesions were lysed bluntly and with the electrocautery where indicated, taking care not to injure any adjacent organs or viscus. The infundibulum was grasped (with some difficulty because the patient had a very large gallstone that extended all the way to the infundibulum) And retracted laterally, exposing the peritoneum overlying the triangle of Calot. This was then divided and exposed in a blunt fashion.  Using a combination of blunt dissection with the suction irrigator catheter along with the [facility] I was able to identify the tubular structure coming out of the  infundibulum.  This was consistent with the cystic duct.  I identified the node of Calot.  But given how thickened the gallbladder wall was I was not able to identify the cystic artery just yet.  I was able to achieve a window around the cystic duct.  Near infrared fluorescent activity was identified in the common hepatic duct, common bile duct and the cystic duct and small bowel.  I was able to identify the cystic artery but given how much difficulty it was in retracting the infundibulum laterally it was hard to get around the cystic artery at this time.  I decided to go ahead and take the cystic duct.  My partner had joined me in the operating room at this point.  An assistant was requested due to the extreme inflammation of the gallbladder and difficulty in retracting the infundibulum given his significant 5 cm gallstone within the gallbladder.  We reconfirmed the anatomy.  I again turned on the Stryker camera system and visualized infrared fluorescent activity within the tubular structure coming out of the gallbladder identified as the cystic duct going into the common bile duct with the common hepatic duct above it.  2 clips were placed on the proximal aspect of the cystic duct and one as it entered the gallbladder and then transected with EndoShears between.  This allowed [place] to lift up the infundibulum a little bit more to further expose the area immediately.  I incised the peritoneum of the gallbladder medially and again laterally.  There was some bleeding from the posterior branch of the cystic artery as well as from the anterior branch.  Clips were placed on each of those structures and hemostasis was achieved.  The gallbladder was then mobilized up out of the gallbladder fossa.  The back wall of the gallbladder was intimately adhered to the liver capsule.  We did enter the gallbladder with some bile spillage as well as a large gallstone that spilled out.  There was some bleeding from another vessel higher  up in the gallbladder fossa.  Hemostasis was achieved with 2 clips.  We then switched to a dome down approach just due to the patient's anatomy with his rib cage and his large stone within the gallbladder.  I was able to get the posterior wall of the gallbladder off of the liver.  The gallbladder was ultimately freed.  There was no additional stone spillage. The liver bed was irrigated and inspected. Hemostasis was achieved with the electrocautery. Copious irrigation was utilized and was repeatedly aspirated until clear.    The gallbladder was removed and placed in an Ecco sac long with the spilled gallstone..  The gallbladder and Ecco sac were then removed through the supraumbilical port site.  Did have to enlarge the fascial defect for an additional 2 inches in order to extract the specimen bag along with the specimen due to how large the gallstone was.  The pursestring suture was used to close the umbilical fascia.  Still obviously fascial defect after closure of the pursestring suture that had initially been placed.  Additional interrupted 0 Vicryl sutures were used to reapproximate the fascia.  We then went back into the abdomen to inspect our closure.  We again inspected the right upper quadrant for hemostasis.  The umbilical closure was inspected and there was no air leak and nothing trapped within the closure. Pneumoperitoneum was released as we removed the trocars.  I irrigated the supraumbilical incision copiously.  4-0 Monocryl was used to close the skin  by the RNFA.    steri-strips, and clean dressings were applied. The patient was then extubated and brought to the recovery room in stable condition. Instrument, sponge, and needle counts were correct at closure and at the conclusion of the case.   Findings: Severe cholecystitis with Cholelithiasis Near infrared fluorescent cholangiography performed demonstrating activity within the cystic duct, common hepatic duct, common bile duct and small  bowel. Positive surgical snow placed within the gallbladder fossa  Estimated Blood Loss: 100cc         Drains: none         Specimens: Gallbladder           Complications: None; patient tolerated the procedure well.         Disposition: PACU - hemodynamically stable.         Condition: stable  Leighton Ruff. Redmond Pulling, MD, FACS General, Bariatric, & Minimally Invasive Surgery [facility],  [place]

## 2022-06-16 NOTE — ED Triage Notes (Signed)
POV from home, A&O x 4, GCS 15, amb to room  C/o right sided upper abd pain that radiates to his back. Sts that yesterday pain worsened and had associated vomiting, same happened last weekend. Pt sts that he was supposed to get gallbladder removed last year but did not.

## 2022-06-16 NOTE — Interval H&P Note (Signed)
History and Physical Interval Note:  06/16/2022 11:20 AM  Marcus Wilkins  has presented today for surgery, with the diagnosis of Cholecystitis.  The various methods of treatment have been discussed with the patient and family. After consideration of risks, benefits and other options for treatment, the patient has consented to  Procedure(s): LAPAROSCOPIC CHOLECYSTECTOMY (N/A) INDOCYANINE GREEN FLUORESCENCE IMAGING (ICG) (N/A) as a surgical intervention.  The patient's history has been reviewed, patient examined, no change in status, stable for surgery.  I have reviewed the patient's chart and labs.  Questions were answered to the patient's satisfaction.    I believe the patient's symptoms are consistent with gallbladder disease.    I discussed laparoscopic cholecystectomy in detail.    We discussed the risks and benefits of a laparoscopic cholecystectomy including, but not limited to bleeding, infection, injury to surrounding structures such as the intestine or liver, bile leak, retained gallstones, need to convert to an open procedure, prolonged diarrhea, blood clots such as  DVT, common bile duct injury, anesthesia risks, and possible need for additional procedures.  We discussed the typical post-operative recovery course. I explained that the likelihood of improvement of their symptoms is good.  We did discuss that he is at higher risk for hernia due to his large gallstone needing probably a larger extraction site.  We will place additional sutures in the muscle.  We talked about postoperative pain.  We also talked about increased risk for bile leak/need for additional procedures and/or subtotal cholecystectomy given his acute calculus cholecystitis picture.  We also talked about the possibility of need to leave a temporary surgical drain   Marcus Wilkins

## 2022-06-16 NOTE — Anesthesia Procedure Notes (Addendum)
Procedure Name: Intubation Date/Time: 06/16/2022 11:39 AM  Performed by: Leonor Liv, CRNAPre-anesthesia Checklist: Patient identified, Emergency Drugs available, Suction available and Patient being monitored Patient Re-evaluated:Patient Re-evaluated prior to induction Oxygen Delivery Method: Circle System Utilized Preoxygenation: Pre-oxygenation with 100% oxygen Induction Type: IV induction Ventilation: Mask ventilation without difficulty Laryngoscope Size: Mac and 4 Grade View: Grade I Tube type: Oral Tube size: 7.5 mm Number of attempts: 1 Airway Equipment and Method: Stylet Placement Confirmation: ETT inserted through vocal cords under direct vision, positive ETCO2 and breath sounds checked- equal and bilateral Secured at: 23 cm Tube secured with: Tape Dental Injury: Teeth and Oropharynx as per pre-operative assessment

## 2022-06-16 NOTE — H&P (Signed)
Reason for Consult:abdominal pain Referring Provider: Shanon Rosser, M.D.  Marcus Wilkins is an 57 y.o. male.  HPI: 57 yo male with 2 weeks of epigastric abdominal pain. Pain is mainly right sided and radiates to the back. He has been having nausea and vomiting. 1 week ago the pain let up for the last 2 days have been worse.  Past Medical History:  Diagnosis Date   Abdominal pain    right side radiating to left side   Allergy    DM (diabetes mellitus) ([place])    ELECTROCARDIOGRAM, ABNORMAL 12/28/2008   Qualifier: Diagnosis of  By: Johnsie Cancel, MD, Rona Ravens    GERD (gastroesophageal reflux disease)    hx ulcer   H. pylori infection    Osteoarthritis    Plantar fasciitis    Post-operative nausea and vomiting    1 time   Sinusitis, chronic 12/22/2014   -hx sinus surgery, sees ENT     Past Surgical History:  Procedure Laterality Date   KNEE ARTHROSCOPY W/ MENISCAL REPAIR     bilateral   WISDOM TOOTH EXTRACTION      Family History  Problem Relation Age of Onset   Stroke Mother    Heart disease Mother        valvular   Colon cancer Mother    Stroke Father    Diabetes Father    Hypertension Father     Social History:  reports that he has never smoked. He has never used smokeless tobacco. He reports that he does not drink alcohol and does not use drugs.  Allergies: No Known Allergies  Medications: I have reviewed the patient's current medications.  Results for orders placed or performed during the hospital encounter of 06/16/22 (from the past 48 hour(s))  CBC with Differential     Status: Abnormal   Collection Time: 06/16/22  3:08 AM  Result Value Ref Range   WBC 14.8 (H) 4.0 - 10.5 K/uL   RBC 4.95 4.22 - 5.81 MIL/uL   Hemoglobin 14.4 13.0 - 17.0 g/dL   HCT 42.4 39.0 - 52.0 %   MCV 85.7 80.0 - 100.0 fL   MCH 29.1 26.0 - 34.0 pg   MCHC 34.0 30.0 - 36.0 g/dL   RDW 14.0 11.5 - 15.5 %   Platelets 171 150 - 400 K/uL   nRBC 0.0 0.0 - 0.2 %   Neutrophils Relative  % 83 %   Neutro Abs 12.2 (H) 1.7 - 7.7 K/uL   Lymphocytes Relative 9 %   Lymphs Abs 1.4 0.7 - 4.0 K/uL   Monocytes Relative 7 %   Monocytes Absolute 1.0 0.1 - 1.0 K/uL   Eosinophils Relative 1 %   Eosinophils Absolute 0.1 0.0 - 0.5 K/uL   Basophils Relative 0 %   Basophils Absolute 0.0 0.0 - 0.1 K/uL   Immature Granulocytes 0 %   Abs Immature Granulocytes 0.04 0.00 - 0.07 K/uL    Comment: Performed at [facility], [place], [place] [address]  Comprehensive metabolic panel     Status: Abnormal   Collection Time: 06/16/22  3:08 AM  Result Value Ref Range   Sodium 136 135 - 145 mmol/L   Potassium 4.0 3.5 - 5.1 mmol/L   Chloride 101 98 - 111 mmol/L   CO2 25 22 - 32 mmol/L   Glucose, Bld 239 (H) 70 - 99 mg/dL    Comment: Glucose reference range applies only to samples taken after fasting for at least  8 hours.   BUN 10 6 - 20 mg/dL   Creatinine, Ser 0.86 0.61 - 1.24 mg/dL   Calcium 9.7 8.9 - 10.3 mg/dL   Total Protein 7.8 6.5 - 8.1 g/dL   Albumin 4.2 3.5 - 5.0 g/dL   AST 15 15 - 41 U/L   ALT 22 0 - 44 U/L   Alkaline Phosphatase 78 38 - 126 U/L   Total Bilirubin 2.0 (H) 0.3 - 1.2 mg/dL   GFR, Estimated >60 >60 mL/min    Comment: (NOTE) Calculated using the CKD-EPI Creatinine Equation (2021)    Anion gap 10 5 - 15    Comment: Performed at [facility], [address], [place], [place] [address]  Lipase, blood     Status: None   Collection Time: 06/16/22  3:08 AM  Result Value Ref Range   Lipase 17 11 - 51 U/L    Comment: Performed at [facility], [address], [place], [place] [address]    CT ABDOMEN PELVIS W CONTRAST  Result Date: 06/16/2022 CLINICAL DATA:  57 year old male with right abdominal pain radiating to the back since yesterday. Vomiting. EXAM: CT ABDOMEN AND PELVIS WITH CONTRAST TECHNIQUE: Multidetector CT imaging of the abdomen and pelvis was performed using the standard protocol following  bolus administration of intravenous contrast. RADIATION DOSE REDUCTION: This exam was performed according to the departmental dose-optimization program which includes automated exposure control, adjustment of the mA and/or kV according to patient size and/or use of iterative reconstruction technique. CONTRAST:  174mL OMNIPAQUE IOHEXOL 300 MG/ML  SOLN COMPARISON:  Chest radiographs 08/30/2017. FINDINGS: Lower chest: Normal heart size. No pericardial or pleural effusion. Mild, fairly symmetric lower lobe atelectasis at the lung bases. Hepatobiliary: Dilated gallbladder (11 cm in length) with bulky and heterogeneous gallstones individually at least 5 cm diameter (series 2, image 32 and coronal image 84). There is subtle hyperenhancement of the liver parenchyma adjacent to the gallbladder on coronal image 72. However, no convincing pericholecystic fat stranding. No bile duct enlargement. Negative liver enhancement otherwise. Pancreas: Negative.  No pancreatic duct enlargement. Spleen: Negative. Adrenals/Urinary Tract: Normal adrenal glands. Kidneys are nonobstructed, normal. Symmetric renal enhancement and contrast excretion. Decompressed ureters. Unremarkable bladder. Incidental pelvic phleboliths. Stomach/Bowel: Much of the large and small bowel is decompressed, negative. Normal appendix tracks caudal from the cecum on coronal image 67. Negative terminal ileum. No dilated loops. Stomach and duodenum also decompressed and negative. No free air, free fluid, or mesenteric inflammation identified. Vascular/Lymphatic: Aortoiliac calcified atherosclerosis. Normal caliber abdominal aorta. Major arterial structures are patent. Portal venous system is patent. No lymphadenopathy identified. Reproductive: Negative. Other: No pelvis free fluid.  Incidental calcified phleboliths. Musculoskeletal: Widespread spinal disc and endplate degeneration. Flowing endplate osteophytes in the lower thoracic spine resulting in multilevel  ankylosis. Multilevel degenerative lumbar spinal stenosis. No acute osseous abnormality identified. IMPRESSION: 1. Highly abnormal gallbladder is distended with large, heterogeneous gallstones (5 cm). Subtle hyperenhancement of the adjacent liver parenchyma is suspicious for Acute Cholecystitis. But there is no overt pericholecystic fat stranding. 2. No other acute or inflammatory process identified in the abdomen or pelvis. Normal appendix. 3.  Aortic Atherosclerosis (ICD10-I70.0). Electronically Signed   By: Genevie Ann M.D.   On: 06/16/2022 04:30    ROS  PE Blood pressure (!) 152/87, pulse (!) 101, temperature 98.1 F (36.7 C), temperature source Oral, resp. rate 18, height 6\' 2"  (1.88 m), weight 104.3 kg, SpO2 97 %. Constitutional: NAD; conversant; no deformities Eyes: Moist conjunctiva; no lid  lag; anicteric; PERRL Neck: Trachea midline; no thyromegaly Lungs: Normal respiratory effort; no tactile fremitus CV: RRR; no palpable thrills; no pitting edema GI: Abd epigastric pain on palpation; no palpable hepatosplenomegaly MSK: Normal gait; no clubbing/cyanosis Psychiatric: Appropriate affect; alert and oriented x3 Lymphatic: No palpable cervical or axillary lymphadenopathy Skin: No major subcutaneous nodules. Warm and dry   Assessment/Plan: 57 yo male with acute calculous cholecystitis and diabetes mellitus -IV abx -pain control -SSI -plan for lap chole today -We discussed the etiology of her pain, we discussed treatment options and recommended surgery. We discussed details of surgery including general anesthesia, laparoscopic approach, identification of cystic duct and common bile duct. Ligation of cystic duct and cystic artery. Possible need for intraoperative cholangiogram or open procedure. Possible risks of common bile duct injury, liver injury, cystic duct leak, bleeding, infection, post-cholecystectomy syndrome. The patient showed good understanding and all questions were  answered -admit to surgical floor  I reviewed last 24 h vitals and pain scores, last 48 h intake and output, last 24 h labs and trends, and last 24 h imaging results.  This care required high  level of medical decision making.   Marcus Wilkins 06/16/2022, 6:54 AM

## 2022-06-16 NOTE — ED Provider Notes (Addendum)
[facility] Provider Note: Georgena Spurling, MD, FACEP  CSN: [id number] MRN: [id number] ARRIVAL: 06/16/22 at [place]: [id number]   CHIEF COMPLAINT  Abdominal Pain   HISTORY OF PRESENT ILLNESS  06/16/22 2:47 AM Marcus Wilkins is a 57 y.o. male with known cholelithiasis.  He is here with right upper quadrant pain, radiating to his back, that began the evening before yesterday.  It was initially intermittent but overnight became constant.  He currently rates it as a 7 out of 10.  It is worse with palpation, movement and lying on his right side.  He has had a decreased appetite and when he does eat the pain is worse.  He vomited 2 times this morning.  He has not had diarrhea with this.  He was scheduled to have a cholecystectomy last year but this was put off due to the death of his mother.   Past Medical History:  Diagnosis Date   Abdominal pain    right side radiating to left side   Allergy    DM (diabetes mellitus) ([place])    ELECTROCARDIOGRAM, ABNORMAL 12/28/2008   Qualifier: Diagnosis of  By: Johnsie Cancel, MD, Rona Ravens    GERD (gastroesophageal reflux disease)    hx ulcer   H. pylori infection    Osteoarthritis    Plantar fasciitis    Post-operative nausea and vomiting    1 time   Sinusitis, chronic 12/22/2014   -hx sinus surgery, sees ENT     Past Surgical History:  Procedure Laterality Date   KNEE ARTHROSCOPY W/ MENISCAL REPAIR     bilateral   WISDOM TOOTH EXTRACTION      Family History  Problem Relation Age of Onset   Stroke Mother    Heart disease Mother        valvular   Colon cancer Mother    Stroke Father    Diabetes Father    Hypertension Father     Social History   Tobacco Use   Smoking status: Never   Smokeless tobacco: Never  Substance Use Topics   Alcohol use: No    Comment: 1-2 drinks a few times per year   Drug use: No    Prior to Admission medications   Medication Sig Start Date End Date Taking? Authorizing Provider   fluticasone (FLONASE) 50 MCG/ACT nasal spray Place 2 sprays into both nostrils daily. 01/25/22   Valentina Shaggy, MD  losartan (COZAAR) 50 MG tablet Take 50 mg by mouth daily.    [provider]  montelukast (SINGULAIR) 10 MG tablet Take one tablet once daily as directed. 01/31/22   Valentina Shaggy, MD  naproxen (NAPROSYN) 500 MG tablet Take 1 tablet by mouth 2 (two) times daily.    [provider]    Allergies Patient has no known allergies.   REVIEW OF SYSTEMS  Negative except as noted here or in the History of Present Illness.   PHYSICAL EXAMINATION  Initial Vital Signs Blood pressure (!) 170/99, pulse 100, temperature 98.3 F (36.8 C), temperature source Oral, resp. rate 17, height 6\' 2"  (1.88 m), weight 104.3 kg, SpO2 97 %.  Examination General: Well-developed, well-nourished male in no acute distress; appearance consistent with age of record HENT: normocephalic; atraumatic Eyes: Normal appearance Neck: supple Heart: regular rate and rhythm Lungs: clear to auscultation bilaterally Abdomen: soft; nondistended; right upper quadrant tenderness; bowel sounds present Extremities: No deformity; full range of motion; pulses normal Neurologic: Awake, alert and oriented; motor function intact  in all extremities and symmetric; no facial droop Skin: Warm and dry Psychiatric: Normal mood and affect   RESULTS  Summary of this visit's results, reviewed and interpreted by myself:   EKG Interpretation  Date/Time:    Ventricular Rate:    PR Interval:    QRS Duration:   QT Interval:    QTC Calculation:   R Axis:     Text Interpretation:         Laboratory Studies: Results for orders placed or performed during the hospital encounter of 06/16/22 (from the past 24 hour(s))  CBC with Differential     Status: Abnormal   Collection Time: 06/16/22  3:08 AM  Result Value Ref Range   WBC 14.8 (H) 4.0 - 10.5 K/uL   RBC 4.95 4.22 - 5.81 MIL/uL    Hemoglobin 14.4 13.0 - 17.0 g/dL   HCT 42.4 39.0 - 52.0 %   MCV 85.7 80.0 - 100.0 fL   MCH 29.1 26.0 - 34.0 pg   MCHC 34.0 30.0 - 36.0 g/dL   RDW 14.0 11.5 - 15.5 %   Platelets 171 150 - 400 K/uL   nRBC 0.0 0.0 - 0.2 %   Neutrophils Relative % 83 %   Neutro Abs 12.2 (H) 1.7 - 7.7 K/uL   Lymphocytes Relative 9 %   Lymphs Abs 1.4 0.7 - 4.0 K/uL   Monocytes Relative 7 %   Monocytes Absolute 1.0 0.1 - 1.0 K/uL   Eosinophils Relative 1 %   Eosinophils Absolute 0.1 0.0 - 0.5 K/uL   Basophils Relative 0 %   Basophils Absolute 0.0 0.0 - 0.1 K/uL   Immature Granulocytes 0 %   Abs Immature Granulocytes 0.04 0.00 - 0.07 K/uL  Comprehensive metabolic panel     Status: Abnormal   Collection Time: 06/16/22  3:08 AM  Result Value Ref Range   Sodium 136 135 - 145 mmol/L   Potassium 4.0 3.5 - 5.1 mmol/L   Chloride 101 98 - 111 mmol/L   CO2 25 22 - 32 mmol/L   Glucose, Bld 239 (H) 70 - 99 mg/dL   BUN 10 6 - 20 mg/dL   Creatinine, Ser 0.86 0.61 - 1.24 mg/dL   Calcium 9.7 8.9 - 10.3 mg/dL   Total Protein 7.8 6.5 - 8.1 g/dL   Albumin 4.2 3.5 - 5.0 g/dL   AST 15 15 - 41 U/L   ALT 22 0 - 44 U/L   Alkaline Phosphatase 78 38 - 126 U/L   Total Bilirubin 2.0 (H) 0.3 - 1.2 mg/dL   GFR, Estimated >60 >60 mL/min   Anion gap 10 5 - 15  Lipase, blood     Status: None   Collection Time: 06/16/22  3:08 AM  Result Value Ref Range   Lipase 17 11 - 51 U/L   Imaging Studies: CT ABDOMEN PELVIS W CONTRAST  Result Date: 06/16/2022 CLINICAL DATA:  57 year old male with right abdominal pain radiating to the back since yesterday. Vomiting. EXAM: CT ABDOMEN AND PELVIS WITH CONTRAST TECHNIQUE: Multidetector CT imaging of the abdomen and pelvis was performed using the standard protocol following bolus administration of intravenous contrast. RADIATION DOSE REDUCTION: This exam was performed according to the departmental dose-optimization program which includes automated exposure control, adjustment of the mA and/or  kV according to patient size and/or use of iterative reconstruction technique. CONTRAST:  132mL OMNIPAQUE IOHEXOL 300 MG/ML  SOLN COMPARISON:  Chest radiographs 08/30/2017. FINDINGS: Lower chest: Normal heart size. No pericardial or pleural effusion.  Mild, fairly symmetric lower lobe atelectasis at the lung bases. Hepatobiliary: Dilated gallbladder (11 cm in length) with bulky and heterogeneous gallstones individually at least 5 cm diameter (series 2, image 32 and coronal image 84). There is subtle hyperenhancement of the liver parenchyma adjacent to the gallbladder on coronal image 72. However, no convincing pericholecystic fat stranding. No bile duct enlargement. Negative liver enhancement otherwise. Pancreas: Negative.  No pancreatic duct enlargement. Spleen: Negative. Adrenals/Urinary Tract: Normal adrenal glands. Kidneys are nonobstructed, normal. Symmetric renal enhancement and contrast excretion. Decompressed ureters. Unremarkable bladder. Incidental pelvic phleboliths. Stomach/Bowel: Much of the large and small bowel is decompressed, negative. Normal appendix tracks caudal from the cecum on coronal image 67. Negative terminal ileum. No dilated loops. Stomach and duodenum also decompressed and negative. No free air, free fluid, or mesenteric inflammation identified. Vascular/Lymphatic: Aortoiliac calcified atherosclerosis. Normal caliber abdominal aorta. Major arterial structures are patent. Portal venous system is patent. No lymphadenopathy identified. Reproductive: Negative. Other: No pelvis free fluid.  Incidental calcified phleboliths. Musculoskeletal: Widespread spinal disc and endplate degeneration. Flowing endplate osteophytes in the lower thoracic spine resulting in multilevel ankylosis. Multilevel degenerative lumbar spinal stenosis. No acute osseous abnormality identified. IMPRESSION: 1. Highly abnormal gallbladder is distended with large, heterogeneous gallstones (5 cm). Subtle hyperenhancement of  the adjacent liver parenchyma is suspicious for Acute Cholecystitis. But there is no overt pericholecystic fat stranding. 2. No other acute or inflammatory process identified in the abdomen or pelvis. Normal appendix. 3.  Aortic Atherosclerosis (ICD10-I70.0). Electronically Signed   By: Genevie Ann M.D.   On: 06/16/2022 04:30    ED COURSE and MDM  Nursing notes, initial and subsequent vitals signs, including pulse oximetry, reviewed and interpreted by myself.  Vitals:   06/16/22 0400 06/16/22 0430 06/16/22 0445 06/16/22 0500  BP: (!) 156/91 (!) 162/96  (!) 157/102  Pulse: 96 (!) 103 (!) 107 (!) 104  Resp:      Temp:      TempSrc:      SpO2: 99% 96% 94% 98%  Weight:      Height:       Medications  piperacillin-tazobactam (ZOSYN) IVPB 3.375 g (3.375 g Intravenous New Bag/Given 06/16/22 0455)  fentaNYL (SUBLIMAZE) injection 50 mcg (50 mcg Intravenous Given 06/16/22 0449)  0.9 %  sodium chloride infusion (has no administration in time range)  insulin aspart (novoLOG) injection 5 Units (has no administration in time range)  ondansetron (ZOFRAN) injection 4 mg (4 mg Intravenous Given 06/16/22 0316)  fentaNYL (SUBLIMAZE) injection 100 mcg (100 mcg Intravenous Given 06/16/22 0316)  iohexol (OMNIPAQUE) 300 MG/ML solution 100 mL (100 mLs Intravenous Contrast Given 06/16/22 0411)   4:41 AM Patient's clinical presentation was concerning for acute cholecystitis given about 36 hours of pain, right upper quadrant pain in the context of known gallstones, and leukocytosis.  CT findings are supportive of this diagnosis.  Although not mentioned by the radiologist, it appears there is gas in the gallbladder which is concerning for emphysematous cholecystitis.  We will start the patient on antibiotics and Dr. Kieth Brightly of general surgery has accepted the patient for admission to [facility] in anticipation of emergent cholecystectomy.   PROCEDURES  Procedures   ED DIAGNOSES     ICD-10-CM   1. Acute  cholecystitis  K81.0          Danyella Mcginty, Jenny Reichmann, MD 06/16/22 0446    Shanon Rosser, MD 06/16/22 [phone]

## 2022-06-16 NOTE — Anesthesia Postprocedure Evaluation (Signed)
Anesthesia Post Note  Patient: Marcus Wilkins  Procedure(s) Performed: LAPAROSCOPIC CHOLECYSTECTOMY INDOCYANINE GREEN FLUORESCENCE IMAGING (ICG)     Patient location during evaluation: PACU Anesthesia Type: General Level of consciousness: awake and alert Pain management: pain level controlled Vital Signs Assessment: post-procedure vital signs reviewed and stable Respiratory status: spontaneous breathing, nonlabored ventilation, respiratory function stable and patient connected to nasal cannula oxygen Cardiovascular status: blood pressure returned to baseline and stable Postop Assessment: no apparent nausea or vomiting Anesthetic complications: no   No notable events documented.  Last Vitals:  Vitals:   06/16/22 1445 06/16/22 1501  BP: 122/80 133/81  Pulse: 100 100  Resp: 19 16  Temp: 36.9 C 36.9 C  SpO2: 93% 91%    Last Pain:  Vitals:   06/16/22 1501  TempSrc: Oral  PainSc:                  March Rummage Herman Fiero

## 2022-06-16 NOTE — Transfer of Care (Signed)
Immediate Anesthesia Transfer of Care Note  Patient: Marcus Wilkins  Procedure(s) Performed: LAPAROSCOPIC CHOLECYSTECTOMY INDOCYANINE GREEN FLUORESCENCE IMAGING (ICG)  Patient Location: PACU  Anesthesia Type:General  Level of Consciousness: sedated  Airway & Oxygen Therapy: Patient Spontanous Breathing, Patient connected to face mask oxygen, and oral airway  Post-op Assessment: Report given to RN and Post -op Vital signs reviewed and stable  Post vital signs: Reviewed and stable  Last Vitals:  Vitals Value Taken Time  BP 115/76 06/16/22 1404  Temp    Pulse 105 06/16/22 1407  Resp 20 06/16/22 1407  SpO2 95 % 06/16/22 1407  Vitals shown include unvalidated device data.  Last Pain:  Vitals:   06/16/22 0941  TempSrc: Oral  PainSc:          Complications: No notable events documented.

## 2022-06-16 NOTE — Anesthesia Preprocedure Evaluation (Signed)
Anesthesia Evaluation  Patient identified by MRN, date of birth, ID band Patient awake    Reviewed: Allergy & Precautions, NPO status , Patient's Chart, lab work & pertinent test results  History of Anesthesia Complications (+) PONV and history of anesthetic complications  Airway Mallampati: II  TM Distance: >3 FB Neck ROM: Full    Dental no notable dental hx.    Pulmonary neg pulmonary ROS   Pulmonary exam normal        Cardiovascular hypertension, Pt. on medications  Rhythm:Regular Rate:Normal     Neuro/Psych negative neurological ROS  negative psych ROS   GI/Hepatic Neg liver ROS,GERD  ,,Cholecystitis    Endo/Other  diabetes, Type 2    Renal/GU negative Renal ROS  negative genitourinary   Musculoskeletal  (+) Arthritis , Osteoarthritis,    Abdominal Normal abdominal exam  (+)   Peds  Hematology negative hematology ROS (+)   Anesthesia Other Findings   Reproductive/Obstetrics                             Anesthesia Physical Anesthesia Plan  ASA: 2  Anesthesia Plan: General   Post-op Pain Management:    Induction: Intravenous  PONV Risk Score and Plan: 3 and Ondansetron, Dexamethasone, Midazolam and Treatment may vary due to age or medical condition  Airway Management Planned: Mask and Oral ETT  Additional Equipment: None  Intra-op Plan:   Post-operative Plan: Extubation in OR  Informed Consent: I have reviewed the patients History and Physical, chart, labs and discussed the procedure including the risks, benefits and alternatives for the proposed anesthesia with the patient or authorized representative who has indicated his/her understanding and acceptance.     Dental advisory given  Plan Discussed with: CRNA  Anesthesia Plan Comments:        Anesthesia Quick Evaluation

## 2022-06-17 ENCOUNTER — Encounter (HOSPITAL_COMMUNITY): Payer: Self-pay | Admitting: General Surgery

## 2022-06-17 LAB — COMPREHENSIVE METABOLIC PANEL
ALT: 59 U/L — ABNORMAL HIGH (ref 0–44)
AST: 50 U/L — ABNORMAL HIGH (ref 15–41)
Albumin: 2.9 g/dL — ABNORMAL LOW (ref 3.5–5.0)
Alkaline Phosphatase: 69 U/L (ref 38–126)
Anion gap: 6 (ref 5–15)
BUN: 9 mg/dL (ref 6–20)
CO2: 25 mmol/L (ref 22–32)
Calcium: 8.4 mg/dL — ABNORMAL LOW (ref 8.9–10.3)
Chloride: 101 mmol/L (ref 98–111)
Creatinine, Ser: 0.96 mg/dL (ref 0.61–1.24)
GFR, Estimated: >60 mL/min (ref >60)
Glucose, Bld: 196 mg/dL — ABNORMAL HIGH (ref 70–99)
Potassium: 3.8 mmol/L (ref 3.5–5.1)
Sodium: 132 mmol/L — ABNORMAL LOW (ref 135–145)
Total Bilirubin: 2.1 mg/dL — ABNORMAL HIGH (ref 0.3–1.2)
Total Protein: 6.6 g/dL (ref 6.5–8.1)

## 2022-06-17 LAB — CBC
HCT: 39.3 % (ref 39.0–52.0)
Hemoglobin: 13.2 g/dL (ref 13.0–17.0)
MCH: 28.8 pg (ref 26.0–34.0)
MCHC: 33.6 g/dL (ref 30.0–36.0)
MCV: 85.6 fL (ref 80.0–100.0)
Platelets: 163 10*3/uL (ref 150–400)
RBC: 4.59 MIL/uL (ref 4.22–5.81)
RDW: 13.9 % (ref 11.5–15.5)
WBC: 19.8 10*3/uL — ABNORMAL HIGH (ref 4.0–10.5)
nRBC: 0 % (ref 0.0–0.2)

## 2022-06-17 LAB — GLUCOSE, CAPILLARY
Glucose-Capillary: 198 mg/dL — ABNORMAL HIGH (ref 70–99)
Glucose-Capillary: 142 mg/dL — ABNORMAL HIGH (ref 70–99)

## 2022-06-17 MED ORDER — AMOXICILLIN-POT CLAVULANATE 875-125 MG PO TABS: 1.0000 | ORAL_TABLET | Freq: Two times a day (BID) | ORAL | 0 refills | Status: AC

## 2022-06-17 MED ORDER — OXYCODONE HCL 5 MG PO TABS: 5.0000 mg | ORAL_TABLET | Freq: Four times a day (QID) | ORAL | 0 refills | Status: DC | PRN

## 2022-06-17 MED ORDER — ACETAMINOPHEN 500 MG PO TABS: 1000.0000 mg | ORAL_TABLET | Freq: Three times a day (TID) | ORAL | 0 refills | Status: AC

## 2022-06-17 NOTE — Progress Notes (Signed)
Patient ID: Marcus Wilkins, male   DOB: [birth year], 57 y.o.   MRN: [id number]   An After Visit Summary was printed and given to the patient.   Patient education given on medication changes, incision care and follow up appointments and the patient expresses understanding and acceptance of instructions.   Haydee Salter 06/17/2022 4:53 PM

## 2022-06-17 NOTE — Plan of Care (Signed)
Patient ID: Marcus Wilkins, male   DOB: 11-Apr-1965, 57 y.o.   MRN: [id number]   Problem: Education: Goal: Knowledge of General Education information will improve Description: Including pain rating scale, medication(s)/side effects and non-pharmacologic comfort measures Outcome: Adequate for Discharge   Problem: Health Behavior/Discharge Planning: Goal: Ability to manage health-related needs will improve Outcome: Adequate for Discharge   Problem: Clinical Measurements: Goal: Ability to maintain clinical measurements within normal limits will improve Outcome: Adequate for Discharge Goal: Will remain free from infection Outcome: Adequate for Discharge Goal: Diagnostic test results will improve Outcome: Adequate for Discharge Goal: Respiratory complications will improve Outcome: Adequate for Discharge Goal: Cardiovascular complication will be avoided Outcome: Adequate for Discharge   Problem: Activity: Goal: Risk for activity intolerance will decrease Outcome: Adequate for Discharge   Problem: Nutrition: Goal: Adequate nutrition will be maintained Outcome: Adequate for Discharge   Problem: Coping: Goal: Level of anxiety will decrease Outcome: Adequate for Discharge   Problem: Elimination: Goal: Will not experience complications related to bowel motility Outcome: Adequate for Discharge Goal: Will not experience complications related to urinary retention Outcome: Adequate for Discharge   Problem: Pain Managment: Goal: General experience of comfort will improve Outcome: Adequate for Discharge   Problem: Safety: Goal: Ability to remain free from injury will improve Outcome: Adequate for Discharge   Problem: Skin Integrity: Goal: Risk for impaired skin integrity will decrease Outcome: Adequate for Discharge   Problem: Education: Goal: Ability to describe self-care measures that may prevent or decrease complications (Diabetes Survival Skills Education) will improve Outcome:  Adequate for Discharge Goal: Individualized Educational Video(s) Outcome: Adequate for Discharge   Problem: Coping: Goal: Ability to adjust to condition or change in health will improve Outcome: Adequate for Discharge   Problem: Fluid Volume: Goal: Ability to maintain a balanced intake and output will improve Outcome: Adequate for Discharge   Problem: Health Behavior/Discharge Planning: Goal: Ability to identify and utilize available resources and services will improve Outcome: Adequate for Discharge Goal: Ability to manage health-related needs will improve Outcome: Adequate for Discharge   Problem: Metabolic: Goal: Ability to maintain appropriate glucose levels will improve Outcome: Adequate for Discharge   Problem: Nutritional: Goal: Maintenance of adequate nutrition will improve Outcome: Adequate for Discharge Goal: Progress toward achieving an optimal weight will improve Outcome: Adequate for Discharge   Problem: Skin Integrity: Goal: Risk for impaired skin integrity will decrease Outcome: Adequate for Discharge   Problem: Tissue Perfusion: Goal: Adequacy of tissue perfusion will improve Outcome: Adequate for Discharge     Haydee Salter, RN

## 2022-06-17 NOTE — Progress Notes (Signed)
Assessment & Plan: POD#1 - status post lap cholecystectomy - Dr. Redmond Pulling, 06/16/2022 - tolerating soft diet - limited mobility - voiding - pain controlled - IV Zosyn  Discussed discharge today or tomorrow.  Encouraged OOB, po intake.  Will check on patient this afternoon - potential discharge home today.        Armandina Gemma, MD [facility] [place] practice Office: [phone]        Chief Complaint: Cholelithiasis, cholecystitis  Subjective: Patient up at bedside, wife in room.  Taking limited po diet.  Objective: Vital signs in last 24 hours: Temp:  [98.3 F (36.8 C)-98.7 F (37.1 C)] 98.5 F (36.9 C) (03/24 0846) Pulse Rate:  [100-115] 107 (03/24 0900) Resp:  [13-21] 16 (03/24 0846) BP: (112-133)/(67-81) 112/70 (03/24 0846) SpO2:  [88 %-95 %] 93 % (03/24 0846)    Intake/Output from previous day: 03/23 0701 - 03/24 0700 In: 2895.7 [P.O.:240; I.V.:2593.8; IV Piggyback:61.8] Out: 1320 [Urine:1300; Blood:20] Intake/Output this shift: No intake/output data recorded.  Physical Exam: HEENT - sclerae clear, mucous membranes moist Neck - soft Abdomen - mild distension; dressings dry and intact Ext - no edema, non-tender Neuro - alert & oriented, no focal deficits  Lab Results:  Recent Labs    06/16/22 0752 06/17/22 0135  WBC 16.8* 19.8*  HGB 14.6 13.2  HCT 44.0 39.3  PLT 184 163   BMET Recent Labs    06/16/22 0308 06/16/22 0752 06/17/22 0135  NA 136  --  132*  K 4.0  --  3.8  CL 101  --  101  CO2 25  --  25  GLUCOSE 239*  --  196*  BUN 10  --  9  CREATININE 0.86 0.97 0.96  CALCIUM 9.7  --  8.4*   PT/INR No results for input(s): "LABPROT", "INR" in the last 72 hours. Comprehensive Metabolic Panel:    Component Value Date/Time   NA 132 (L) 06/17/2022 0135   NA 136 06/16/2022 0308   NA 143 03/02/2021 1102   K 3.8 06/17/2022 0135   K 4.0 06/16/2022 0308   CL 101 06/17/2022 0135   CL 101 06/16/2022 0308   CO2 25  06/17/2022 0135   CO2 25 06/16/2022 0308   BUN 9 06/17/2022 0135   BUN 10 06/16/2022 0308   BUN 13 03/02/2021 1102   CREATININE 0.96 06/17/2022 0135   CREATININE 0.97 06/16/2022 0752   GLUCOSE 196 (H) 06/17/2022 0135   GLUCOSE 239 (H) 06/16/2022 0308   CALCIUM 8.4 (L) 06/17/2022 0135   CALCIUM 9.7 06/16/2022 0308   AST 50 (H) 06/17/2022 0135   AST 15 06/16/2022 0308   ALT 59 (H) 06/17/2022 0135   ALT 22 06/16/2022 0308   ALKPHOS 69 06/17/2022 0135   ALKPHOS 78 06/16/2022 0308   BILITOT 2.1 (H) 06/17/2022 0135   BILITOT 2.0 (H) 06/16/2022 0308   BILITOT 0.8 03/02/2021 1102   PROT 6.6 06/17/2022 0135   PROT 7.8 06/16/2022 0308   PROT 7.7 03/02/2021 1102   ALBUMIN 2.9 (L) 06/17/2022 0135   ALBUMIN 4.2 06/16/2022 0308   ALBUMIN 4.4 03/02/2021 1102    Studies/Results: CT ABDOMEN PELVIS W CONTRAST  Result Date: 06/16/2022 CLINICAL DATA:  57 year old male with right abdominal pain radiating to the back since yesterday. Vomiting. EXAM: CT ABDOMEN AND PELVIS WITH CONTRAST TECHNIQUE: Multidetector CT imaging of the abdomen and pelvis was performed using the standard protocol following bolus administration of intravenous contrast. RADIATION DOSE REDUCTION: This exam was  performed according to the departmental dose-optimization program which includes automated exposure control, adjustment of the mA and/or kV according to patient size and/or use of iterative reconstruction technique. CONTRAST:  182mL OMNIPAQUE IOHEXOL 300 MG/ML  SOLN COMPARISON:  Chest radiographs 08/30/2017. FINDINGS: Lower chest: Normal heart size. No pericardial or pleural effusion. Mild, fairly symmetric lower lobe atelectasis at the lung bases. Hepatobiliary: Dilated gallbladder (11 cm in length) with bulky and heterogeneous gallstones individually at least 5 cm diameter (series 2, image 32 and coronal image 84). There is subtle hyperenhancement of the liver parenchyma adjacent to the gallbladder on coronal image 72. However,  no convincing pericholecystic fat stranding. No bile duct enlargement. Negative liver enhancement otherwise. Pancreas: Negative.  No pancreatic duct enlargement. Spleen: Negative. Adrenals/Urinary Tract: Normal adrenal glands. Kidneys are nonobstructed, normal. Symmetric renal enhancement and contrast excretion. Decompressed ureters. Unremarkable bladder. Incidental pelvic phleboliths. Stomach/Bowel: Much of the large and small bowel is decompressed, negative. Normal appendix tracks caudal from the cecum on coronal image 67. Negative terminal ileum. No dilated loops. Stomach and duodenum also decompressed and negative. No free air, free fluid, or mesenteric inflammation identified. Vascular/Lymphatic: Aortoiliac calcified atherosclerosis. Normal caliber abdominal aorta. Major arterial structures are patent. Portal venous system is patent. No lymphadenopathy identified. Reproductive: Negative. Other: No pelvis free fluid.  Incidental calcified phleboliths. Musculoskeletal: Widespread spinal disc and endplate degeneration. Flowing endplate osteophytes in the lower thoracic spine resulting in multilevel ankylosis. Multilevel degenerative lumbar spinal stenosis. No acute osseous abnormality identified. IMPRESSION: 1. Highly abnormal gallbladder is distended with large, heterogeneous gallstones (5 cm). Subtle hyperenhancement of the adjacent liver parenchyma is suspicious for Acute Cholecystitis. But there is no overt pericholecystic fat stranding. 2. No other acute or inflammatory process identified in the abdomen or pelvis. Normal appendix. 3.  Aortic Atherosclerosis (ICD10-I70.0). Electronically Signed   By: Genevie Ann M.D.   On: 06/16/2022 04:30      Armandina Gemma 06/17/2022  Patient ID: Marcus Wilkins, male   DOB: April 29, 1965, 57 y.o.   MRN: [id number]

## 2022-06-17 NOTE — Discharge Instructions (Signed)
[facility] LAPAROSCOPIC SURGERY: POST OP INSTRUCTIONS Always review your discharge instruction sheet given to you by the facility where your surgery was performed. IF YOU HAVE DISABILITY OR FAMILY LEAVE FORMS, YOU MUST BRING THEM TO THE OFFICE FOR PROCESSING.   DO NOT GIVE THEM TO YOUR DOCTOR.  PAIN CONTROL  First take acetaminophen (Tylenol) AND/or ibuprofen (Advil) to control your pain after surgery.  Follow directions on package.  Taking acetaminophen (Tylenol) and/or ibuprofen (Advil) regularly after surgery will help to control your pain and lower the amount of prescription pain medication you may need.  You should not take more than 3,000 mg (3 grams) of acetaminophen (Tylenol) in 24 hours.  You should not take ibuprofen (Advil), aleve, motrin, naprosyn or other NSAIDS if you have a history of stomach ulcers or chronic kidney disease.  A prescription for pain medication may be given to you upon discharge.  Take your pain medication as prescribed, if you still have uncontrolled pain after taking acetaminophen (Tylenol) or ibuprofen (Advil). Use ice packs to help control pain. If you need a refill on your pain medication, please contact your pharmacy.  They will contact our office to request authorization. Prescriptions will not be filled after 5pm or on week-ends.  HOME MEDICATIONS Take your usually prescribed medications unless otherwise directed.  DIET You should follow a light diet the first few days after arrival home.  Be sure to include lots of fluids daily. Avoid fatty, fried foods.   CONSTIPATION It is common to experience some constipation after surgery and if you are taking pain medication.  Increasing fluid intake and taking a stool softener (such as Colace) will usually help or prevent this problem from occurring.  A mild laxative (Milk of Magnesia or Miralax) should be taken according to package instructions if there are no bowel movements after 48  hours.  WOUND/INCISION CARE Most patients will experience some swelling and bruising in the area of the incisions.  Ice packs will help.  Swelling and bruising can take several days to resolve.  Unless discharge instructions indicate otherwise, follow guidelines below  STERI-STRIPS - you may remove your outer bandages 48 hours after surgery, and you may shower at that time.  You have steri-strips (small skin tapes) in place directly over the incision.  These strips should be left on the skin for 7-10 days.   DERMABOND/SKIN GLUE - you may shower in 24 hours.  The glue will flake off over the next 2-3 weeks. Any sutures or staples will be removed at the office during your follow-up visit.  ACTIVITIES You may resume regular (light) daily activities beginning the next day--such as daily self-care, walking, climbing stairs--gradually increasing activities as tolerated.  You may have sexual intercourse when it is comfortable.  Refrain from any heavy lifting or straining until approved by your doctor. You may drive when you are no longer taking prescription pain medication, you can comfortably wear a seatbelt, and you can safely maneuver your car and apply brakes.  FOLLOW-UP You should see your doctor in the office for a follow-up appointment approximately 2-3 weeks after your surgery.  You should have been given your post-op/follow-up appointment when your surgery was scheduled.  If you did not receive a post-op/follow-up appointment, make sure that you call for this appointment within a day or two after you arrive home to insure a convenient appointment time.  OTHER INSTRUCTIONS   WHEN TO CALL YOUR DOCTOR: Fever over 101.0 Inability to urinate Continued   bleeding from incision. Increased pain, redness, or drainage from the incision. Increasing abdominal pain  The clinic staff is available to answer your questions during regular business hours.  Please don't hesitate to call and ask to speak to  one of the nurses for clinical concerns.  If you have a medical emergency, go to the nearest emergency room or call 911.  A surgeon from [facility] is always on call at the hospital. [address], [address], [place], [place]  [address] ? [address] [address], [place], [place]   [address] [phone] ? [phone] ? FAX [phone] Web site: [email or web address]  

## 2022-06-18 LAB — HEMOGLOBIN A1C
Hgb A1c MFr Bld: 8.5 % — ABNORMAL HIGH (ref 4.8–5.6)
Mean Plasma Glucose: 197 mg/dL

## 2022-06-19 LAB — SURGICAL PATHOLOGY

## 2022-06-19 NOTE — Discharge Summary (Signed)
[facility] Discharge Summary   Patient ID: Marcus Wilkins MRN: [id number] DOB/AGE: 10-25-1965 57 y.o.  Admit date: 06/16/2022 Discharge date: 06/19/2022   Discharge Diagnosis Acute cholecystitis   Consultants None  Imaging: No results found.  Procedures Dr. Redmond Pulling (06/16/2022) - Laparoscopic Cholecystectomy  Hospital Course:  Marcus Wilkins is a 57 y.o. male who presented to the ED 3/23 with 2 weeks of epigastric abdominal pain. Pain is mainly right sided and radiates to the back. He has been having nausea and vomiting. 1 week ago the pain let up for the last 2 days have been worse.  Workup showed acute calculous cholecystitis.  Patient was taken to the operating room for procedure listed above.  Tolerated procedure well and was transferred to the PACU.  On POD#0, the patient was voiding well, tolerating diet, ambulating well, pain well controlled, vital signs stable, incisions c/d/i and felt stable for discharge home.  Patient will follow up as below and knows to call with questions or concerns.     Allergies as of 06/17/2022   No Known Allergies      Medication List     TAKE these medications    acetaminophen 500 MG tablet Commonly known as: TYLENOL Take 2 tablets (1,000 mg total) by mouth every 8 (eight) hours for 5 days.   amoxicillin-clavulanate 875-125 MG tablet Commonly known as: AUGMENTIN Take 1 tablet by mouth 2 (two) times daily for 2 days.   losartan 50 MG tablet Commonly known as: COZAAR Take 50 mg by mouth daily.   oxyCODONE 5 MG immediate release tablet Commonly known as: Oxy IR/ROXICODONE Take 1 tablet (5 mg total) by mouth every 6 (six) hours as needed for severe pain.          Follow-up Information     [facility], [place]. Schedule an appointment as soon as possible for a visit in 2 week(s).   Specialty: General Surgery Why: For wound re-check in [facility] [place] information: [address] [place]  [place] [address] [phone]                  Signed: Wellington Hampshire, [facility] 06/19/2022, 1:49 PM Please see Amion for pager number during day hours 7:00am-4:30pm

## 2023-01-23 ENCOUNTER — Encounter: Payer: Self-pay | Admitting: Internal Medicine

## 2023-01-23 NOTE — Patient Instructions (Addendum)
   Your goal BP is less than 130/80   Blood work was ordered.   The lab is on the first floor.    Medications changes include :   none    A referral was ordered Dr Leone Payor and someone will call you to schedule an appointment.     Return in about 3 months (around 04/26/2023) for Physical Exam.

## 2023-01-23 NOTE — Progress Notes (Unsigned)
Subjective:    Patient ID: Marcus Wilkins, male    DOB: [birth year], 57 y.o.   MRN: [id number]     HPI Freddy is here for follow up of his chronic medical problems.  He is here to establish with a new pcp.     Diet - eating more fruits and veges  He is exercising regular.   BP at home 170/90 on occasion - does not check often  - does not feel the BP is accurate  Medications and allergies reviewed with patient and updated if appropriate.  No current outpatient medications on file prior to visit.   No current facility-administered medications on file prior to visit.     Review of Systems  Constitutional:  Negative for fever.  Eyes:  Negative for visual disturbance.  Respiratory:  Negative for cough, shortness of breath and wheezing.   Cardiovascular:  Negative for chest pain, palpitations and leg swelling.  Neurological:  Positive for numbness (occ tingling). Negative for light-headedness and headaches.       Objective:   Vitals:   01/24/23 1119  BP: (!) 148/80  Pulse: 70  Temp: 98.6 F (37 C)  SpO2: 99%   BP Readings from Last 3 Encounters:  01/24/23 (!) 148/80  06/17/22 112/70  01/25/22 130/82   Wt Readings from Last 3 Encounters:  01/24/23 224 lb (101.6 kg)  06/16/22 230 lb (104.3 kg)  06/06/21 246 lb 8 oz (111.8 kg)   Body mass index is 28.76 kg/m.    Physical Exam Constitutional:      General: He is not in acute distress.    Appearance: Normal appearance. He is not ill-appearing.  HENT:     Head: Normocephalic and atraumatic.  Eyes:     Conjunctiva/sclera: Conjunctivae normal.  Cardiovascular:     Rate and Rhythm: Normal rate and regular rhythm.     Heart sounds: Normal heart sounds.  Pulmonary:     Effort: Pulmonary effort is normal. No respiratory distress.     Breath sounds: Normal breath sounds. No wheezing or rales.  Abdominal:     General: There is no distension.     Palpations: Abdomen is soft.     Tenderness: There is no  abdominal tenderness. There is no guarding or rebound.  Musculoskeletal:     Right lower leg: No edema.     Left lower leg: No edema.  Skin:    General: Skin is warm and dry.     Findings: No rash.  Neurological:     Mental Status: He is alert. Mental status is at baseline.  Psychiatric:        Mood and Affect: Mood normal.        Lab Results  Component Value Date   WBC 19.8 (H) 06/17/2022   HGB 13.2 06/17/2022   HCT 39.3 06/17/2022   PLT 163 06/17/2022   GLUCOSE 196 (H) 06/17/2022   CHOL 155 08/29/2017   TRIG 95.0 12/22/2014   HDL 30.30 (L) 08/29/2017   LDLCALC 90 12/22/2014   ALT 59 (H) 06/17/2022   AST 50 (H) 06/17/2022   NA 132 (L) 06/17/2022   K 3.8 06/17/2022   CL 101 06/17/2022   CREATININE 0.96 06/17/2022   BUN 9 06/17/2022   CO2 25 06/17/2022   TSH 1.26 08/21/2013   PSA 1.37 05/17/2016   HGBA1C 8.5 (H) 06/16/2022   MICROALBUR 1.0 09/14/2016     Assessment & Plan:    See Problem List  for Assessment and Plan of chronic medical problems.    Fu in 3 months for CPE

## 2023-01-24 ENCOUNTER — Ambulatory Visit: Payer: 59 | Admitting: Internal Medicine

## 2023-01-24 ENCOUNTER — Encounter: Payer: Self-pay | Admitting: Internal Medicine

## 2023-01-24 VITALS — BP 148/80 | HR 70 | Temp 98.6°F | Ht 74.0 in | Wt 224.0 lb

## 2023-01-24 DIAGNOSIS — Z800 Family history of malignant neoplasm of digestive organs: Secondary | ICD-10-CM

## 2023-01-24 DIAGNOSIS — E785 Hyperlipidemia, unspecified: Secondary | ICD-10-CM

## 2023-01-24 DIAGNOSIS — E1159 Type 2 diabetes mellitus with other circulatory complications: Secondary | ICD-10-CM | POA: Diagnosis not present

## 2023-01-24 DIAGNOSIS — E1169 Type 2 diabetes mellitus with other specified complication: Secondary | ICD-10-CM

## 2023-01-24 DIAGNOSIS — I152 Hypertension secondary to endocrine disorders: Secondary | ICD-10-CM | POA: Diagnosis not present

## 2023-01-24 DIAGNOSIS — E119 Type 2 diabetes mellitus without complications: Secondary | ICD-10-CM

## 2023-01-24 LAB — COMPREHENSIVE METABOLIC PANEL
ALT: 28 U/L (ref 0–53)
AST: 28 U/L (ref 0–37)
Albumin: 4.3 g/dL (ref 3.5–5.2)
Alkaline Phosphatase: 88 U/L (ref 39–117)
BUN: 12 mg/dL (ref 6–23)
CO2: 31 meq/L (ref 19–32)
Calcium: 10 mg/dL (ref 8.4–10.5)
Chloride: 103 meq/L (ref 96–112)
Creatinine, Ser: 0.89 mg/dL (ref 0.40–1.50)
GFR: 93.75 mL/min (ref >60.00)
Glucose, Bld: 74 mg/dL (ref 70–99)
Potassium: 4 meq/L (ref 3.5–5.1)
Sodium: 142 meq/L (ref 135–145)
Total Bilirubin: 0.9 mg/dL (ref 0.2–1.2)
Total Protein: 8 g/dL (ref 6.0–8.3)

## 2023-01-24 LAB — MICROALBUMIN / CREATININE URINE RATIO
Creatinine,U: 221.2 mg/dL
Microalb Creat Ratio: 0.4 mg/g (ref 0.0–30.0)
Microalb, Ur: 0.8 mg/dL (ref 0.0–1.9)

## 2023-01-24 LAB — LIPID PANEL
Cholesterol: 131 mg/dL (ref 0–200)
HDL: 44.6 mg/dL (ref >39.00)
LDL Cholesterol: 50 mg/dL (ref 0–99)
NonHDL: 86.11
Total CHOL/HDL Ratio: 3
Triglycerides: 180 mg/dL — ABNORMAL HIGH (ref 0.0–149.0)
VLDL: 36 mg/dL (ref 0.0–40.0)

## 2023-01-24 LAB — CBC WITH DIFFERENTIAL/PLATELET
Basophils Absolute: 0.1 10*3/uL (ref 0.0–0.1)
Basophils Relative: 0.7 % (ref 0.0–3.0)
Eosinophils Absolute: 0.4 10*3/uL (ref 0.0–0.7)
Eosinophils Relative: 4.3 % (ref 0.0–5.0)
HCT: 48.1 % (ref 39.0–52.0)
Hemoglobin: 15 g/dL (ref 13.0–17.0)
Lymphocytes Relative: 26.2 % (ref 12.0–46.0)
Lymphs Abs: 2.1 10*3/uL (ref 0.7–4.0)
MCHC: 31.1 g/dL (ref 30.0–36.0)
MCV: 88.8 fL (ref 78.0–100.0)
Monocytes Absolute: 0.7 10*3/uL (ref 0.1–1.0)
Monocytes Relative: 8.8 % (ref 3.0–12.0)
Neutro Abs: 4.9 10*3/uL (ref 1.4–7.7)
Neutrophils Relative %: 60 % (ref 43.0–77.0)
Platelets: 212 10*3/uL (ref 150.0–400.0)
RBC: 5.42 Mil/uL (ref 4.22–5.81)
RDW: 14.8 % (ref 11.5–15.5)
WBC: 8.2 10*3/uL (ref 4.0–10.5)

## 2023-01-24 LAB — HEMOGLOBIN A1C: Hgb A1c MFr Bld: 6.3 % (ref 4.6–6.5)

## 2023-01-24 MED ORDER — LOSARTAN POTASSIUM 50 MG PO TABS: 50.0000 mg | ORAL_TABLET | Freq: Every day | ORAL | 1 refills | Status: DC

## 2023-01-24 NOTE — Assessment & Plan Note (Addendum)
Chronic BP well controlled Continue losartan 50 mg daily Discussed concerns of BP not being well controlled Cmp, cbc Monitor BP at home

## 2023-01-24 NOTE — Assessment & Plan Note (Addendum)
Chronic Check lipid panel  Not on any medication Regular exercise and healthy diet encouraged

## 2023-01-24 NOTE — Assessment & Plan Note (Addendum)
Chronic  Lab Results  Component Value Date   HGBA1C 8.5 (H) 06/16/2022   Sugars not ideally controlled - he was not sure he was a diabetic Currently not on medication Check A1c, urine microalbumin today  Stressed regular exercise, diabetic diet

## 2023-02-25 ENCOUNTER — Encounter: Payer: Self-pay | Admitting: Internal Medicine

## 2023-03-26 ENCOUNTER — Encounter: Payer: Self-pay | Admitting: Internal Medicine

## 2023-04-25 ENCOUNTER — Encounter: Payer: Self-pay | Admitting: Internal Medicine

## 2023-04-25 ENCOUNTER — Ambulatory Visit: Payer: Commercial Managed Care - PPO | Admitting: Internal Medicine

## 2023-04-25 VITALS — BP 140/86 | HR 78 | Temp 98.6°F | Ht 74.0 in | Wt 224.0 lb

## 2023-04-25 DIAGNOSIS — Z0000 Encounter for general adult medical examination without abnormal findings: Secondary | ICD-10-CM

## 2023-04-25 DIAGNOSIS — E119 Type 2 diabetes mellitus without complications: Secondary | ICD-10-CM | POA: Diagnosis not present

## 2023-04-25 DIAGNOSIS — E042 Nontoxic multinodular goiter: Secondary | ICD-10-CM

## 2023-04-25 DIAGNOSIS — E785 Hyperlipidemia, unspecified: Secondary | ICD-10-CM

## 2023-04-25 DIAGNOSIS — I152 Hypertension secondary to endocrine disorders: Secondary | ICD-10-CM

## 2023-04-25 DIAGNOSIS — E1159 Type 2 diabetes mellitus with other circulatory complications: Secondary | ICD-10-CM | POA: Diagnosis not present

## 2023-04-25 DIAGNOSIS — Z125 Encounter for screening for malignant neoplasm of prostate: Secondary | ICD-10-CM | POA: Diagnosis not present

## 2023-04-25 DIAGNOSIS — Z23 Encounter for immunization: Secondary | ICD-10-CM

## 2023-04-25 DIAGNOSIS — R0683 Snoring: Secondary | ICD-10-CM

## 2023-04-25 DIAGNOSIS — E1169 Type 2 diabetes mellitus with other specified complication: Secondary | ICD-10-CM

## 2023-04-25 DIAGNOSIS — E010 Iodine-deficiency related diffuse (endemic) goiter: Secondary | ICD-10-CM

## 2023-04-25 DIAGNOSIS — E041 Nontoxic single thyroid nodule: Secondary | ICD-10-CM

## 2023-04-25 DIAGNOSIS — S46811A Strain of other muscles, fascia and tendons at shoulder and upper arm level, right arm, initial encounter: Secondary | ICD-10-CM

## 2023-04-25 DIAGNOSIS — Z0001 Encounter for general adult medical examination with abnormal findings: Secondary | ICD-10-CM | POA: Diagnosis not present

## 2023-04-25 LAB — CBC WITH DIFFERENTIAL/PLATELET
Basophils Absolute: 0 10*3/uL (ref 0.0–0.1)
Basophils Relative: 0.6 % (ref 0.0–3.0)
Eosinophils Absolute: 0.4 10*3/uL (ref 0.0–0.7)
Eosinophils Relative: 4.8 % (ref 0.0–5.0)
HCT: 48.6 % (ref 39.0–52.0)
Hemoglobin: 15.7 g/dL (ref 13.0–17.0)
Lymphocytes Relative: 29.6 % (ref 12.0–46.0)
Lymphs Abs: 2.3 10*3/uL (ref 0.7–4.0)
MCHC: 32.2 g/dL (ref 30.0–36.0)
MCV: 88.8 fL (ref 78.0–100.0)
Monocytes Absolute: 0.5 10*3/uL (ref 0.1–1.0)
Monocytes Relative: 7.1 % (ref 3.0–12.0)
Neutro Abs: 4.4 10*3/uL (ref 1.4–7.7)
Neutrophils Relative %: 57.9 % (ref 43.0–77.0)
Platelets: 198 10*3/uL (ref 150.0–400.0)
RBC: 5.48 Mil/uL (ref 4.22–5.81)
RDW: 14.6 % (ref 11.5–15.5)
WBC: 7.6 10*3/uL (ref 4.0–10.5)

## 2023-04-25 LAB — COMPREHENSIVE METABOLIC PANEL
ALT: 22 U/L (ref 0–53)
AST: 23 U/L (ref 0–37)
Albumin: 4.5 g/dL (ref 3.5–5.2)
Alkaline Phosphatase: 94 U/L (ref 39–117)
BUN: 14 mg/dL (ref 6–23)
CO2: 30 meq/L (ref 19–32)
Calcium: 10 mg/dL (ref 8.4–10.5)
Chloride: 102 meq/L (ref 96–112)
Creatinine, Ser: 0.84 mg/dL (ref 0.40–1.50)
GFR: 95.23 mL/min (ref >60.00)
Glucose, Bld: 91 mg/dL (ref 70–99)
Potassium: 4.9 meq/L (ref 3.5–5.1)
Sodium: 140 meq/L (ref 135–145)
Total Bilirubin: 1.2 mg/dL (ref 0.2–1.2)
Total Protein: 7.7 g/dL (ref 6.0–8.3)

## 2023-04-25 LAB — LIPID PANEL
Cholesterol: 148 mg/dL (ref 0–200)
HDL: 48.7 mg/dL (ref >39.00)
LDL Cholesterol: 77 mg/dL (ref 0–99)
NonHDL: 99.2
Total CHOL/HDL Ratio: 3
Triglycerides: 112 mg/dL (ref 0.0–149.0)
VLDL: 22.4 mg/dL (ref 0.0–40.0)

## 2023-04-25 LAB — PSA: PSA: 2.1 ng/mL (ref 0.10–4.00)

## 2023-04-25 LAB — TSH: TSH: 0.94 u[IU]/mL (ref 0.35–5.50)

## 2023-04-25 LAB — HEMOGLOBIN A1C: Hgb A1c MFr Bld: 6.5 % (ref 4.6–6.5)

## 2023-04-25 MED ORDER — CYCLOBENZAPRINE HCL 5 MG PO TABS: 5.0000 mg | ORAL_TABLET | Freq: Three times a day (TID) | ORAL | 0 refills | Status: DC | PRN

## 2023-04-25 MED ORDER — LOSARTAN POTASSIUM-HCTZ 50-12.5 MG PO TABS: 1.0000 | ORAL_TABLET | Freq: Every day | ORAL | 1 refills | Status: DC

## 2023-04-25 MED ORDER — PREDNISONE 20 MG PO TABS: 40.0000 mg | ORAL_TABLET | Freq: Every day | ORAL | 0 refills | Status: AC

## 2023-04-25 NOTE — Assessment & Plan Note (Signed)
Chronic  Lab Results  Component Value Date   HGBA1C 6.5 04/25/2023   Sugars controlled  Currently not on medication Check A1c  Stressed regular exercise, diabetic diet

## 2023-04-25 NOTE — Assessment & Plan Note (Addendum)
Chronic BP not ideally controlled Continue losartan 50 mg daily, add hydrochlorothiazide 12.5 mg Discussed concerns of BP not being well controlled Cmp, cbc Monitor BP at home Restart exercise  EKG: NSR at 76 bpm, Q waves II and III indicating possible inferior infarct which is not likely.  This is unchanged compared to last EKG from 2010.  Will monitor

## 2023-04-25 NOTE — Progress Notes (Signed)
Subjective:    Patient ID: Marcus Wilkins, male    DOB: 1965-06-08, 58 y.o.   MRN: [id number]     HPI Marcus Wilkins is here for a physical exam and his chronic medical problems.   Strained neck - started yesterday - he turned his head quick - then developed tightness on right side of his neck.  Has not taken taken anything for it   Big toe was bothering him - stinging, pain - ? He thought Gout was coming  - tried athletes foot spray and it helping.     Medications and allergies reviewed with patient and updated if appropriate.  Current Outpatient Medications on File Prior to Visit  Medication Sig Dispense Refill   XDEMVY 0.25 % SOLN      No current facility-administered medications on file prior to visit.    Review of Systems  Constitutional:  Negative for fever.  Eyes:  Negative for visual disturbance.  Respiratory:  Negative for cough, shortness of breath and wheezing.   Cardiovascular:  Negative for chest pain, palpitations and leg swelling.  Gastrointestinal:  Negative for abdominal pain, blood in stool, constipation and diarrhea.       No gerd  Genitourinary:  Negative for difficulty urinating and dysuria.  Musculoskeletal:  Positive for arthralgias (knees, shoulders). Negative for back pain.  Skin:  Negative for rash.  Neurological:  Negative for dizziness, light-headedness and headaches.  Psychiatric/Behavioral:  Negative for dysphoric mood. The patient is not nervous/anxious.        Objective:   Vitals:   04/25/23 1022  BP: (!) 140/86  Pulse: 78  Temp: 98.6 F (37 C)  SpO2: 97%   Filed Weights   04/25/23 1022  Weight: 224 lb (101.6 kg)   Body mass index is 28.76 kg/m.  BP Readings from Last 3 Encounters:  04/25/23 (!) 140/86  01/24/23 (!) 148/80  06/17/22 112/70    Wt Readings from Last 3 Encounters:  04/25/23 224 lb (101.6 kg)  01/24/23 224 lb (101.6 kg)  06/16/22 230 lb (104.3 kg)      Physical Exam Constitutional: He appears well-developed  and well-nourished. No distress.  HENT:  Head: Normocephalic and atraumatic.  Right Ear: External ear normal.  Left Ear: External ear normal.  Normal ear canals and TM b/l  Mouth/Throat: Oropharynx is clear and moist. Eyes: Conjunctivae and EOM are normal.  Neck: Neck supple. No tracheal deviation present. thyromegaly present - left side > right side.  No carotid bruit  Cardiovascular: Normal rate, regular rhythm, normal heart sounds and intact distal pulses.   No murmur heard.  No lower extremity edema. Pulmonary/Chest: Effort normal and breath sounds normal. No respiratory distress. He has no wheezes. He has no rales.  Abdominal: Soft. He exhibits no distension. There is no tenderness.  Genitourinary: deferred  Lymphadenopathy:   He has no cervical adenopathy.  Musculoskeletal: right posterior neck muscle tight and tender to palpation, increased pain with turning head.  No c spine tenderness or left sided neck pain Skin: Skin is warm and dry. He is not diaphoretic.  Psychiatric: He has a normal mood and affect. His behavior is normal.         Assessment & Plan:   Physical exam: Screening blood work  ordered Exercise   has not been exercising regularly since thanksgiving - will restart Weight  overweight Substance abuse   none   Reviewed recommended immunizations.  Flu and shingles #1 today   Health Maintenance  Topic  Date Due   Pneumococcal Vaccine 52-57 Years old (2 of 2 - PCV) 09/14/2017   Colonoscopy  09/02/2018   FOOT EXAM  11/29/2018   OPHTHALMOLOGY EXAM  03/16/2019   COVID-19 Vaccine (3 - 2024-25 season) 05/11/2023 (Originally 11/25/2022)   Zoster Vaccines- Shingrix (2 of 2) 06/20/2023   DTaP/Tdap/Td (2 - Td or Tdap) 08/29/2023   HEMOGLOBIN A1C  10/23/2023   Diabetic kidney evaluation - Urine ACR  01/24/2024   Diabetic kidney evaluation - eGFR measurement  04/24/2024   INFLUENZA VACCINE  Completed   HIV Screening  Completed   Hepatitis C Screening  Addressed    HPV VACCINES  Aged Out     See Problem List for Assessment and Plan of chronic medical problems.

## 2023-04-25 NOTE — Assessment & Plan Note (Signed)
Thyroid feels enlarged Tsh Will get [place] of gland

## 2023-04-25 NOTE — Assessment & Plan Note (Signed)
Acute Started yesterday after turning quick Pain and tightness right side of neck  Flexeril 5-10 mg tid prn - discussed that this may cause drowsiness Prednisone 40 mg every day x 3 days Heat, ice, massage, stretching, topical medications

## 2023-04-25 NOTE — Patient Instructions (Addendum)
Shingles vaccine #1 , flu vaccine given today.   Blood work was ordered.       Medications changes include :   losartan-hydrochlorothiazide 50-12.5 mg  daily for your BP, prednisone 40 mg daily x 3 days, flexeril 5-10 mg three times a day as needed for your neck  Referral ordered for neurology to evaluate for sleep apnea   Return in about 6 months (around 10/23/2023) for follow up.   Health Maintenance, Male Adopting a healthy lifestyle and getting preventive care are important in promoting health and wellness. Ask your health care provider about: The right schedule for you to have regular tests and exams. Things you can do on your own to prevent diseases and keep yourself healthy. What should I know about diet, weight, and exercise? Eat a healthy diet  Eat a diet that includes plenty of vegetables, fruits, low-fat dairy products, and lean protein. Do not eat a lot of foods that are high in solid fats, added sugars, or sodium. Maintain a healthy weight Body mass index (BMI) is a measurement that can be used to identify possible weight problems. It estimates body fat based on height and weight. Your health care provider can help determine your BMI and help you achieve or maintain a healthy weight. Get regular exercise Get regular exercise. This is one of the most important things you can do for your health. Most adults should: Exercise for at least 150 minutes each week. The exercise should increase your heart rate and make you sweat (moderate-intensity exercise). Do strengthening exercises at least twice a week. This is in addition to the moderate-intensity exercise. Spend less time sitting. Even light physical activity can be beneficial. Watch cholesterol and blood lipids Have your blood tested for lipids and cholesterol at 60 years of age, then have this test every 5 years. You may need to have your cholesterol levels checked more often if: Your lipid or cholesterol levels  are high. You are older than 60 years of age. You are at high risk for heart disease. What should I know about cancer screening? Many types of cancers can be detected early and may often be prevented. Depending on your health history and family history, you may need to have cancer screening at various ages. This may include screening for: Colorectal cancer. Prostate cancer. Skin cancer. Lung cancer. What should I know about heart disease, diabetes, and high blood pressure? Blood pressure and heart disease High blood pressure causes heart disease and increases the risk of stroke. This is more likely to develop in people who have high blood pressure readings or are overweight. Talk with your health care provider about your target blood pressure readings. Have your blood pressure checked: Every 3-5 years if you are 67-86 years of age. Every year if you are 68 years old or older. If you are between the ages of 40 and 80 and are a current or former smoker, ask your health care provider if you should have a one-time screening for abdominal aortic aneurysm (AAA). Diabetes Have regular diabetes screenings. This checks your fasting blood sugar level. Have the screening done: Once every three years after age 47 if you are at a normal weight and have a low risk for diabetes. More often and at a younger age if you are overweight or have a high risk for diabetes. What should I know about preventing infection? Hepatitis B If you have a higher risk for hepatitis B, you should be screened for this  virus. Talk with your health care provider to find out if you are at risk for hepatitis B infection. Hepatitis C Blood testing is recommended for: Everyone born from [birth year] through 1967. Anyone with known risk factors for hepatitis C. Sexually transmitted infections (STIs) You should be screened each year for STIs, including gonorrhea and chlamydia, if: You are sexually active and are younger than 60 years of  age. You are older than 60 years of age and your health care provider tells you that you are at risk for this type of infection. Your sexual activity has changed since you were last screened, and you are at increased risk for chlamydia or gonorrhea. Ask your health care provider if you are at risk. Ask your health care provider about whether you are at high risk for HIV. Your health care provider may recommend a prescription medicine to help prevent HIV infection. If you choose to take medicine to prevent HIV, you should first get tested for HIV. You should then be tested every 3 months for as long as you are taking the medicine. Follow these instructions at home: Alcohol use Do not drink alcohol if your health care provider tells you not to drink. If you drink alcohol: Limit how much you have to 0-2 drinks a day. Know how much alcohol is in your drink. In the [place] one drink equals one 12 oz bottle of beer (355 mL), one 5 oz glass of wine (148 mL), or one 1 oz glass of hard liquor (44 mL). Lifestyle Do not use any products that contain nicotine or tobacco. These products include cigarettes, chewing tobacco, and vaping devices, such as e-cigarettes. If you need help quitting, ask your health care provider. Do not use street drugs. Do not share needles. Ask your health care provider for help if you need support or information about quitting drugs. General instructions Schedule regular health, dental, and eye exams. Stay current with your vaccines. Tell your health care provider if: You often feel depressed. You have ever been abused or do not feel safe at home. Summary Adopting a healthy lifestyle and getting preventive care are important in promoting health and wellness. Follow your health care provider's instructions about healthy diet, exercising, and getting tested or screened for diseases. Follow your health care provider's instructions on monitoring your cholesterol and blood  pressure. This information is not intended to replace advice given to you by your health care provider. Make sure you discuss any questions you have with your health care provider. Document Revised: 08/01/2020 Document Reviewed: 08/01/2020 Elsevier Patient Education  2024 [employer].

## 2023-04-25 NOTE — Assessment & Plan Note (Signed)
Chronic Check lipid panel  Not on any medication Regular exercise and healthy diet encouraged

## 2023-04-25 NOTE — Assessment & Plan Note (Signed)
Has been told he snores and has had witnessed apnea Will refer for evaluation of OSA

## 2023-04-29 ENCOUNTER — Ambulatory Visit
Admission: RE | Admit: 2023-04-29 | Discharge: 2023-04-29 | Disposition: A | Discharge status: Home / Self Care | Payer: Commercial Managed Care - PPO | Source: Ambulatory Visit | Attending: Internal Medicine

## 2023-04-29 DIAGNOSIS — E010 Iodine-deficiency related diffuse (endemic) goiter: Secondary | ICD-10-CM

## 2023-04-30 ENCOUNTER — Encounter: Payer: Self-pay | Admitting: Internal Medicine

## 2023-04-30 NOTE — Addendum Note (Signed)
Addended by: Pincus Sanes on: 04/30/2023 06:02 AM   Modules accepted: Orders

## 2023-05-03 ENCOUNTER — Ambulatory Visit (AMBULATORY_SURGERY_CENTER): Payer: Commercial Managed Care - PPO

## 2023-05-03 VITALS — Ht 74.0 in | Wt 224.0 lb

## 2023-05-03 DIAGNOSIS — Z1211 Encounter for screening for malignant neoplasm of colon: Secondary | ICD-10-CM

## 2023-05-03 DIAGNOSIS — Z800 Family history of malignant neoplasm of digestive organs: Secondary | ICD-10-CM

## 2023-05-03 MED ORDER — SUFLAVE 178.7 G PO SOLR: 1.0000 | Freq: Once | ORAL | 0 refills | Status: AC

## 2023-05-03 MED ORDER — SUFLAVE 178.7 G PO SOLR: 1.0000 | Freq: Once | ORAL | 0 refills | Status: DC

## 2023-05-03 NOTE — Progress Notes (Signed)

## 2023-05-13 ENCOUNTER — Encounter: Payer: Self-pay | Admitting: Internal Medicine

## 2023-05-13 ENCOUNTER — Encounter: Payer: Self-pay | Admitting: Certified Registered Nurse Anesthetist

## 2023-05-13 NOTE — Progress Notes (Unsigned)
Gastroenterology History and Physical   Primary Care Physician:  Pincus Sanes, MD   Reason for Procedure:   [facility] CRCA  Plan:    colonoscopy     HPI: Marcus Wilkins is a 58 y.o. male - mother had CRCA in her 66's Last colonoscopy 2015 negative   Past Medical History:  Diagnosis Date   Abdominal pain    right side radiating to left side   Allergy    DM (diabetes mellitus) (HCC)    ELECTROCARDIOGRAM, ABNORMAL 12/28/2008   Qualifier: Diagnosis of  By: Eden Emms, MD, Harrington Challenger    GERD (gastroesophageal reflux disease)    hx ulcer   H. pylori infection    Osteoarthritis    Plantar fasciitis    Post-operative nausea and vomiting    1 time   Sinusitis, chronic 12/22/2014   -hx sinus surgery, sees ENT     Past Surgical History:  Procedure Laterality Date   CHOLECYSTECTOMY N/A 06/16/2022   Procedure: LAPAROSCOPIC CHOLECYSTECTOMY;  Surgeon: Gaynelle Adu, MD;  Location: [facility] OR;  Service: General;  Laterality: N/A;   KNEE ARTHROSCOPY W/ MENISCAL REPAIR     bilateral   WISDOM TOOTH EXTRACTION      Prior to Admission medications   Medication Sig Start Date End Date Taking? Authorizing Provider  cyclobenzaprine (FLEXERIL) 5 MG tablet Take 1-2 tablets (5-10 mg total) by mouth 3 (three) times daily as needed for muscle spasms. 04/25/23   Pincus Sanes, MD  losartan-hydrochlorothiazide (HYZAAR) 50-12.5 MG tablet Take 1 tablet by mouth daily. 04/25/23   Pincus Sanes, MD  XDEMVY 0.25 % SOLN  04/16/23   [provider]    Current Outpatient Medications  Medication Sig Dispense Refill   cyclobenzaprine (FLEXERIL) 5 MG tablet Take 1-2 tablets (5-10 mg total) by mouth 3 (three) times daily as needed for muscle spasms. 20 tablet 0   losartan-hydrochlorothiazide (HYZAAR) 50-12.5 MG tablet Take 1 tablet by mouth daily. 90 tablet 1   XDEMVY 0.25 % SOLN      No current facility-administered medications for this visit.    Allergies as of 05/14/2023   (No Known  Allergies)    Family History  Problem Relation Age of Onset   Colon polyps Mother    Stroke Mother    Heart disease Mother        valvular   Colon cancer Mother    Kidney disease Father    Stroke Father    Diabetes Father    Hypertension Father    Esophageal cancer Neg Hx    Rectal cancer Neg Hx    Stomach cancer Neg Hx     Social History   Socioeconomic History   Marital status: Single    Spouse name: Not on file   Number of children: 0   Years of education: Not on file   Highest education level: Bachelor's degree (e.g., BA, AB, BS)  Occupational History   Not on file  Tobacco Use   Smoking status: Never   Smokeless tobacco: Never  Substance and Sexual Activity   Alcohol use: No    Comment: 1-2 drinks a few times per year   Drug use: No   Sexual activity: Never  Other Topics Concern   Not on file  Social History Narrative   Work or School: post Producer, television/film/video.  Spiritual Beliefs: Ephriam Knuckles   Social Drivers of Health   Financial Resource Strain: Low Risk  (04/24/2023)   Overall Physicist, medical Strain (  CARDIA)    Difficulty of Paying Living Expenses: Not hard at all  Food Insecurity: No Food Insecurity (04/24/2023)   Hunger Vital Sign    Worried About Running Out of Food in the Last Year: Never true    Ran Out of Food in the Last Year: Never true  Transportation Needs: No Transportation Needs (04/24/2023)   PRAPARE - Administrator, Civil Service (Medical): No    Lack of Transportation (Non-Medical): No  Physical Activity: Sufficiently Active (04/24/2023)   Exercise Vital Sign    Days of Exercise per Week: 5 days    Minutes of Exercise per Session: 150+ min  Stress: No Stress Concern Present (04/24/2023)   [employer] of Occupational Health - Occupational Stress Questionnaire    Feeling of Stress : Not at all  Social Connections: Moderately Isolated (04/24/2023)   Social Connection and Isolation Panel [NHANES]    Frequency of  Communication with Friends and Family: More than three times a week    Frequency of Social Gatherings with Friends and Family: More than three times a week    Attends Religious Services: More than 4 times per year    Active Member of [employer] or Organizations: No    Attends Engineer, structural: Not on file    Marital Status: Never married  Intimate Partner Violence: Not At Risk (06/17/2022)   Humiliation, Afraid, Rape, and Kick questionnaire    Fear of Current or Ex-Partner: No    Emotionally Abused: No    Physically Abused: No    Sexually Abused: No    Review of Systems: Positive for *** All other review of systems negative except as mentioned in the HPI.  Physical Exam: Vital signs There were no vitals taken for this visit.  General:   Alert,  Well-developed, well-nourished, pleasant and cooperative in NAD Lungs:  Clear throughout to auscultation.   Heart:  Regular rate and rhythm; no murmurs, clicks, rubs,  or gallops. Abdomen:  Soft, nontender and nondistended. Normal bowel sounds.   Neuro/Psych:  Alert and cooperative. Normal mood and affect. A and O x 3   @Tempestt Silba  Sena Slate, MD, [facility] [phone] (pager) 05/13/2023 5:47 PM@

## 2023-05-14 ENCOUNTER — Encounter: Payer: Self-pay | Admitting: Internal Medicine

## 2023-05-14 ENCOUNTER — Ambulatory Visit (AMBULATORY_SURGERY_CENTER): Payer: Commercial Managed Care - PPO | Admitting: Internal Medicine

## 2023-05-14 VITALS — BP 132/87 | HR 72 | Temp 98.3°F | Resp 15 | Ht 74.0 in | Wt 224.0 lb

## 2023-05-14 DIAGNOSIS — K648 Other hemorrhoids: Secondary | ICD-10-CM | POA: Diagnosis not present

## 2023-05-14 DIAGNOSIS — Z1211 Encounter for screening for malignant neoplasm of colon: Secondary | ICD-10-CM

## 2023-05-14 DIAGNOSIS — D123 Benign neoplasm of transverse colon: Secondary | ICD-10-CM | POA: Diagnosis not present

## 2023-05-14 DIAGNOSIS — Z800 Family history of malignant neoplasm of digestive organs: Secondary | ICD-10-CM

## 2023-05-14 MED ORDER — SODIUM CHLORIDE 0.9 % IV SOLN: 500.0000 mL | Freq: Once | INTRAVENOUS | Status: DC

## 2023-05-14 NOTE — Progress Notes (Signed)
 Report given to PACU, vss

## 2023-05-14 NOTE — Patient Instructions (Addendum)
There was a very tiny polyp (1-68mm) and I removed it. I will let you know pathology results by mail and/or My Chart.  Internal hemorrhoids seen also.  Your next routine colonoscopy should be in 5 years - 2030.  Handouts provided on polyps and hemorrhoids.    YOU HAD AN ENDOSCOPIC PROCEDURE TODAY AT THE [facility]:   Refer to the procedure report that was given to you for any specific questions about what was found during the examination.  If the procedure report does not answer your questions, please call your gastroenterologist to clarify.  If you requested that your care partner not be given the details of your procedure findings, then the procedure report has been included in a sealed envelope for you to review at your convenience later.  YOU SHOULD EXPECT: Some feelings of bloating in the abdomen. Passage of more gas than usual.  Walking can help get rid of the air that was put into your GI tract during the procedure and reduce the bloating. If you had a lower endoscopy (such as a colonoscopy or flexible sigmoidoscopy) you may notice spotting of blood in your stool or on the toilet paper. If you underwent a bowel prep for your procedure, you may not have a normal bowel movement for a few days.  Please Note:  You might notice some irritation and congestion in your nose or some drainage.  This is from the oxygen used during your procedure.  There is no need for concern and it should clear up in a day or so.  SYMPTOMS TO REPORT IMMEDIATELY:  Following lower endoscopy (colonoscopy or flexible sigmoidoscopy):  Excessive amounts of blood in the stool  Significant tenderness or worsening of abdominal pains  Swelling of the abdomen that is new, acute  Fever of 100F or higher  For urgent or emergent issues, a gastroenterologist can be reached at any hour by calling [phone]. Do not use MyChart messaging for urgent concerns.    DIET:  We do recommend a small meal at first,  but then you may proceed to your regular diet.  Drink plenty of fluids but you should avoid alcoholic beverages for 24 hours.  ACTIVITY:  You should plan to take it easy for the rest of today and you should NOT DRIVE or use heavy machinery until tomorrow (because of the sedation medicines used during the test).    FOLLOW UP: Our staff will call the number listed on your records the next business day following your procedure.  We will call around 7:15- 8:00 am to check on you and address any questions or concerns that you may have regarding the information given to you following your procedure. If we do not reach you, we will leave a message.     If any biopsies were taken you will be contacted by phone or by letter within the next 1-3 weeks.  Please call us at [phone] if you have not heard about the biopsies in 3 weeks.    SIGNATURES/CONFIDENTIALITY: You and/or your care partner have signed paperwork which will be entered into your electronic medical record.  These signatures attest to the fact that that the information above on your After Visit Summary has been reviewed and is understood.  Full responsibility of the confidentiality of this discharge information lies with you and/or your care-partner.

## 2023-05-14 NOTE — Progress Notes (Signed)
 Pt's states no medical or surgical changes since previsit or office visit.

## 2023-05-14 NOTE — Op Note (Signed)
[facility] Patient Name: Marcus Wilkins Procedure Date: 05/14/2023 11:23 AM MRN: [id number] Endoscopist: Iva Boop , MD, [id number] Age: 58 Referring MD:  Date of Birth: 1965-09-21 Gender: Male Account #: [id number] Procedure:                Colonoscopy Indications:              Screening in patient at increased risk: Colorectal                            cancer in mother before age 44, Last colonoscopy:                            2015 Medicines:                Monitored Anesthesia Care Procedure:                Pre-Anesthesia Assessment:                           - Prior to the procedure, a History and Physical                            was performed, and patient medications and                            allergies were reviewed. The patient's tolerance of                            previous anesthesia was also reviewed. The risks                            and benefits of the procedure and the sedation                            options and risks were discussed with the patient.                            All questions were answered, and informed consent                            was obtained. Prior Anticoagulants: The patient has                            taken no anticoagulant or antiplatelet agents. ASA                            Grade Assessment: II - A patient with mild systemic                            disease. After reviewing the risks and benefits,                            the patient was deemed in satisfactory condition to  undergo the procedure.                           After obtaining informed consent, the colonoscope                            was passed under direct vision. Throughout the                            procedure, the patient's blood pressure, pulse, and                            oxygen saturations were monitored continuously. The                            Olympus Scope SN: [id number] was introduced through                             the anus and advanced to the the cecum, identified                            by appendiceal orifice and ileocecal valve. The                            colonoscopy was performed without difficulty. The                            patient tolerated the procedure well. The quality                            of the bowel preparation was good. The ileocecal                            valve, appendiceal orifice, and rectum were                            photographed. The bowel preparation used was                            SUFLAVE via split dose instruction. Scope In: 11:40:12 AM Scope Out: 11:51:32 AM Scope Withdrawal Time: 0 hours 8 minutes 55 seconds  Total Procedure Duration: 0 hours 11 minutes 20 seconds  Findings:                 The perianal and digital rectal examinations were                            normal.                           A 1 to 2 mm polyp was found in the transverse                            colon. The polyp was sessile. The polyp was removed  with a cold biopsy forceps. Resection and retrieval                            were complete. Verification of patient                            identification for the specimen was done. Estimated                            blood loss was minimal.                           Internal hemorrhoids were found.                           The exam was otherwise without abnormality on                            direct and retroflexion views. Complications:            No immediate complications. Estimated Blood Loss:     Estimated blood loss was minimal. Impression:               - One 1 to 2 mm polyp in the transverse colon,                            removed with a cold biopsy forceps. Resected and                            retrieved.                           - Internal hemorrhoids.                           - The examination was otherwise normal on direct                             and retroflexion views. Recommendation:           - Patient has a contact number available for                            emergencies. The signs and symptoms of potential                            delayed complications were discussed with the                            patient. Return to normal activities tomorrow.                            Written discharge instructions were provided to the                            patient.                           -  Resume previous diet.                           - Continue present medications.                           - Await pathology results.                           - Repeat colonoscopy in 5 years. Iva Boop, MD 05/14/2023 12:00:07 PM This report has been signed electronically.

## 2023-05-14 NOTE — Progress Notes (Signed)
 Called to room to assist during endoscopic procedure.  Patient ID and intended procedure confirmed with present staff. Received instructions for my participation in the procedure from the performing physician.

## 2023-05-15 ENCOUNTER — Telehealth: Payer: Self-pay | Admitting: *Deleted

## 2023-05-15 NOTE — Telephone Encounter (Signed)
 No answer on  follow up call. Left message.

## 2023-05-16 LAB — SURGICAL PATHOLOGY

## 2023-06-10 ENCOUNTER — Encounter: Payer: Self-pay | Admitting: Internal Medicine

## 2023-06-17 ENCOUNTER — Encounter (INDEPENDENT_AMBULATORY_CARE_PROVIDER_SITE_OTHER): Payer: Self-pay | Admitting: Otolaryngology

## 2023-06-17 ENCOUNTER — Ambulatory Visit (INDEPENDENT_AMBULATORY_CARE_PROVIDER_SITE_OTHER): Payer: Commercial Managed Care - PPO | Admitting: Otolaryngology

## 2023-06-17 VITALS — BP 171/91 | HR 73 | Ht 73.5 in | Wt 220.0 lb

## 2023-06-17 DIAGNOSIS — R490 Dysphonia: Secondary | ICD-10-CM

## 2023-06-17 DIAGNOSIS — E041 Nontoxic single thyroid nodule: Secondary | ICD-10-CM | POA: Diagnosis not present

## 2023-06-17 DIAGNOSIS — E049 Nontoxic goiter, unspecified: Secondary | ICD-10-CM

## 2023-06-17 NOTE — Progress Notes (Signed)
 ENT CONSULT:  Reason for Consult: thyroid nodule   HPI: Discussed the use of AI scribe software for clinical note transcription with the patient, who gave verbal consent to proceed.  History of Present Illness Marcus Wilkins is a 58 year old male who presents with a newly diagnosed thyroid nodule. He was referred by his primary care doctor for evaluation of a thyroid nodule.  A thyroid nodule was discovered by his primary care doctor during an examination. The nodule is located on the right side of the thyroid and measures approximately 5 centimeters on recent thyroid U/S TiRADS-3. He has no compressive symptoms such as dysphagia, sensation of throat tightness, or odynophagia. He experiences occasional hoarseness, but it resolves spontaneously.  Thyroid function tests conducted one month ago were normal. He denies any history of radiation exposure to the neck, except for routine X-rays, and has no personal history of major medical problems such as stroke or myocardial infarction.  He has never been a smoker.  There is no known family history of thyroid disease, autoimmune conditions affecting the thyroid, or thyroid cancer.   Records Reviewed:  PCP office visit 04/25/23 Has been told he snores and has had witnessed apnea Will refer for evaluation of OSA  Thyroid feels enlarged Tsh Will get [place] of gland     GI office visit 05/14/23  Marcus Wilkins is a 58 y.o. male - mother had CRCA in her 65's Last colonoscopy 2015 negative  Plan for colonoscopy - adenoma removed    Past Medical History:  Diagnosis Date   Abdominal pain    right side radiating to left side   Allergy    DM (diabetes mellitus) (HCC)    ELECTROCARDIOGRAM, ABNORMAL 12/28/2008   Qualifier: Diagnosis of  By: Eden Emms, MD, Harrington Challenger    GERD (gastroesophageal reflux disease)    hx ulcer   H. pylori infection    Osteoarthritis    Plantar fasciitis    Post-operative nausea and vomiting    1 time    Sinusitis, chronic 12/22/2014   -hx sinus surgery, sees ENT     Past Surgical History:  Procedure Laterality Date   CHOLECYSTECTOMY N/A 06/16/2022   Procedure: LAPAROSCOPIC CHOLECYSTECTOMY;  Surgeon: Gaynelle Adu, MD;  Location: [facility] OR;  Service: General;  Laterality: N/A;   KNEE ARTHROSCOPY W/ MENISCAL REPAIR     bilateral   WISDOM TOOTH EXTRACTION      Family History  Problem Relation Age of Onset   Colon polyps Mother    Stroke Mother    Heart disease Mother        valvular   Colon cancer Mother    Kidney disease Father    Stroke Father    Diabetes Father    Hypertension Father    Esophageal cancer Neg Hx    Rectal cancer Neg Hx    Stomach cancer Neg Hx     Social History:  reports that he has never smoked. He has never used smokeless tobacco. He reports that he does not drink alcohol and does not use drugs.  Allergies: No Known Allergies  Medications: I have reviewed the patient's current medications.  The PMH, PSH, Medications, Allergies, and SH were reviewed and updated.  ROS: Constitutional: Negative for fever, weight loss and weight gain. Cardiovascular: Negative for chest pain and dyspnea on exertion. Respiratory: Is not experiencing shortness of breath at rest. Gastrointestinal: Negative for nausea and vomiting. Neurological: Negative for headaches. Psychiatric: The patient is not nervous/anxious  Blood pressure (!) 171/91, pulse 73, height 6' 1.5" (1.867 m), weight 220 lb (99.8 kg), SpO2 98%. Body mass index is 28.63 kg/m.  PHYSICAL EXAM:  Exam: General: Well-developed, well-nourished Respiratory Respiratory effort: Equal inspiration and expiration without stridor Cardiovascular Peripheral Vascular: Warm extremities with equal color/perfusion Eyes: No nystagmus with equal extraocular motion bilaterally Neuro/Psych/Balance: Patient oriented to person, place, and time; Appropriate mood and affect; Gait is intact with no imbalance; Cranial nerves I-XII  are intact Head and Face Inspection: Normocephalic and atraumatic without mass or lesion Palpation: Facial skeleton intact without bony stepoffs Salivary Glands: No mass or tenderness Facial Strength: Facial motility symmetric and full bilaterally ENT Pinna: External ear intact and fully developed External canal: Canal is patent with intact skin Tympanic Membrane: Clear and mobile External Nose: No scar or anatomic deformity TMJ: No pain to palpation with full mobility Oral cavity/oropharynx: No erythema or exudate, no lesions present no tonsillar tissue  Neck Neck and Trachea: Midline trachea without mass or lesion Thyroid: No mass or nodularity Lymphatics: No lymphadenopathy  Studies Reviewed:  TSH normal 1 mo ago and 9 yrs ago   Thyroid U/S 04/28/33 FINDINGS: Parenchymal Echotexture: Mildly heterogenous   Isthmus: Normal in size measuring 0.7 cm in diameter   Right lobe: Enlarged measuring 6.7 x 4.0 x 3.6 cm   Left lobe: Normal in size measuring 4.4 x 2.0 x 2.0 cm   _________________________________________________________   Estimated total number of nodules >/= 1 cm: 3   Number of spongiform nodules >/=  2 cm not described below (TR1): 0   Number of mixed cystic and solid nodules >/= 1.5 cm not described below (TR2): 0   _________________________________________________________   There is a 2.1 x 2.0 x 1.8 cm spongiform/benign-appearing nodule within the superior pole of the right lobe of the thyroid (labeled 1), which does not meet criteria to recommend percutaneous sampling or continued dedicated follow-up.   _________________________________________________________   Nodule # 2:   Location: Right; Mid   Maximum size: 4.9 cm; Other 2 dimensions: 3.7 x 3.6 cm   Composition: solid/almost completely solid (2)   Echogenicity: isoechoic (1)   Shape: not taller-than-wide (0)   Margins: ill-defined (0)   Echogenic foci: none (0)   ACR TI-RADS total  points: 3.   ACR TI-RADS risk category: TR3 (3 points).   ACR TI-RADS recommendations:   **Given size (>/= 2.5 cm) and appearance, fine needle aspiration of this mildly suspicious nodule should be considered based on TI-RADS criteria.   _________________________________________________________   There is a 1.7 x 1.6 x 1.2 cm spongiform/benign-appearing nodule within inferior pole of the left lobe thyroid (labeled 3), which does not meet criteria to recommend percutaneous sampling or continued dedicated follow-up.   IMPRESSION: 1. Findings suggestive of multinodular goiter. 2. Dominant 4.9 cm TI-RADS category 3 nodule within the mid right lobe of the thyroid (labeled 2) meets imaging criteria to recommend percutaneous sampling as indicated. 3. Additional spongiform/benign-appearing nodules do not meet criteria to recommend percutaneous sampling or continued dedicated follow-up.   The above is in keeping with the ACR TI-RADS recommendations - J Am  Assessment/Plan: Encounter Diagnoses  Name Primary?   Thyroid nodule Yes   Thyroid goiter    Hoarseness     Assessment and Plan Assessment & Plan Thyroid nodule 5 cm right thyroid nodule, TIRAD 3, meets criteria for FNA. Differential: benign, indeterminate, or suspicious for cancer. Biopsy results will guide management. We discussed that thyroid cancer has high cure rate and  that thyroid nodules are very common.  If benign results: annual ultrasound monitoring. If suspicious for thyroid cancer, will consider thyroid lobectomy. Will discuss final treatment plan after biopsy. He has no compressive sx at this time.  - Order fine needle aspiration biopsy. - Schedule follow-up in 8 weeks for biopsy results.   Intermittent hoarseness Intermittent hoarseness without voice changes or vocal cord dysfunction. Preoperative laryngoscopy may assess vocal cord function if surgery is planned. - Consider laryngoscopy if thyroid surgery is  planned.  Thank you for allowing me to participate in the care of this patient. Please do not hesitate to contact me with any questions or concerns.   Ashok Croon, MD Otolaryngology [facility] Phone: [phone] Fax: [phone]    06/17/2023, 8:24 AM

## 2023-06-28 ENCOUNTER — Ambulatory Visit: Admitting: Neurology

## 2023-06-28 ENCOUNTER — Encounter: Payer: Self-pay | Admitting: Neurology

## 2023-06-28 VITALS — BP 132/77 | HR 88 | Ht 73.0 in | Wt 222.0 lb

## 2023-06-28 DIAGNOSIS — R0681 Apnea, not elsewhere classified: Secondary | ICD-10-CM

## 2023-06-28 DIAGNOSIS — G4719 Other hypersomnia: Secondary | ICD-10-CM | POA: Diagnosis not present

## 2023-06-28 DIAGNOSIS — R0683 Snoring: Secondary | ICD-10-CM | POA: Diagnosis not present

## 2023-06-28 DIAGNOSIS — Z9189 Other specified personal risk factors, not elsewhere classified: Secondary | ICD-10-CM

## 2023-06-28 DIAGNOSIS — E663 Overweight: Secondary | ICD-10-CM

## 2023-06-28 DIAGNOSIS — G4726 Circadian rhythm sleep disorder, shift work type: Secondary | ICD-10-CM

## 2023-06-28 NOTE — Progress Notes (Signed)
 Subjective:    Patient ID: Marcus Wilkins is a 58 y.o. male.  HPI    Marcus Foley, MD, PhD [facility] [address] [address] [place], [place] [address]  Dear Dr. Lawerance Bach,  I saw your patient, Marcus Wilkins, upon your kind request in my sleep clinic today for initial consultation of his sleep disorder, in particular, concern for underlying obstructive sleep apnea.  The patient is unaccompanied today.  As you know, Marcus Wilkins is a 58 year old male with an underlying medical history of hypertension, hyperlipidemia, multinodular goiter, diabetes, reflux disease, osteoarthritis, plantar fasciitis, chronic sinusitis, and overweight state, who reports snoring and excessive daytime somnolence as well as witnessed apneas.  I reviewed your office note from 04/25/2023.  7 years ago when he had surgery, he was encouraged to be checked out for sleep apnea.  He has never had a sleep study. He works third shift, has worked third shift for over 2 decades.  He is getting ready to retire, he works for the [employer].  He generally works 6 days a week, Sunday through Friday.  He works typically from 10 PM to 6:30 AM or till 8 AM.  He does not have a set schedule for his sleep unfortunately.  He may go to bed anywhere between 9 AM and noon.  He does not sleep well, he does not sleep more than a few hours at a time and essentially takes longer naps.  He denies nocturia or recurrent headaches.  He is not aware of any family history of sleep apnea.  He is single and lives alone.  He has no children, no pets in the house.  He does have a TV in his bedroom and it may be on or off in his bedroom while he is sleeping.  He recently saw ENT and will be having a thyroid biopsy soon.  He is a non-smoker and drinks alcohol rarely, no daily caffeine.  He has lost about 30 pounds since last year.  He is working on weight loss.  His Past Medical History Is Significant For: Past Medical History:   Diagnosis Date   Abdominal pain    right side radiating to left side   Allergy    DM (diabetes mellitus) (HCC)    ELECTROCARDIOGRAM, ABNORMAL 12/28/2008   Qualifier: Diagnosis of  By: Eden Emms, MD, Harrington Challenger    GERD (gastroesophageal reflux disease)    hx ulcer   H. pylori infection    Osteoarthritis    Plantar fasciitis    Post-operative nausea and vomiting    1 time   Sinusitis, chronic 12/22/2014   -hx sinus surgery, sees ENT     His Past Surgical History Is Significant For: Past Surgical History:  Procedure Laterality Date   CHOLECYSTECTOMY N/A 06/16/2022   Procedure: LAPAROSCOPIC CHOLECYSTECTOMY;  Surgeon: Gaynelle Adu, MD;  Location: [facility] OR;  Service: General;  Laterality: N/A;   KNEE ARTHROSCOPY W/ MENISCAL REPAIR     bilateral   WISDOM TOOTH EXTRACTION      His Family History Is Significant For: Family History  Problem Relation Age of Onset   Colon polyps Mother    Stroke Mother    Heart disease Mother        valvular   Colon cancer Mother    Kidney disease Father    Stroke Father    Diabetes Father    Hypertension Father    Esophageal cancer Neg Hx    Rectal cancer  Neg Hx    Stomach cancer Neg Hx     His Social History Is Significant For: Social History   Socioeconomic History   Marital status: Single    Spouse name: Not on file   Number of children: 0   Years of education: Not on file   Highest education level: Bachelor's degree (e.g., BA, AB, BS)  Occupational History   Not on file  Tobacco Use   Smoking status: Never   Smokeless tobacco: Never  Vaping Use   Vaping status: Never Used  Substance and Sexual Activity   Alcohol use: No    Comment: 1-2 drinks a few times per year   Drug use: No   Sexual activity: Never  Other Topics Concern   Not on file  Social History Narrative   Work or School: post Producer, television/film/video.  Spiritual Beliefs: Ephriam Knuckles   Social Drivers of Health   Financial Resource Strain: Low Risk  (04/24/2023)    Overall Financial Resource Strain (CARDIA)    Difficulty of Paying Living Expenses: Not hard at all  Food Insecurity: No Food Insecurity (04/24/2023)   Hunger Vital Sign    Worried About Running Out of Food in the Last Year: Never true    Ran Out of Food in the Last Year: Never true  Transportation Needs: No Transportation Needs (04/24/2023)   PRAPARE - Administrator, Civil Service (Medical): No    Lack of Transportation (Non-Medical): No  Physical Activity: Sufficiently Active (04/24/2023)   Exercise Vital Sign    Days of Exercise per Week: 5 days    Minutes of Exercise per Session: 150+ min  Stress: No Stress Concern Present (04/24/2023)   [employer] of Occupational Health - Occupational Stress Questionnaire    Feeling of Stress : Not at all  Social Connections: Moderately Isolated (04/24/2023)   Social Connection and Isolation Panel [NHANES]    Frequency of Communication with Friends and Family: More than three times a week    Frequency of Social Gatherings with Friends and Family: More than three times a week    Attends Religious Services: More than 4 times per year    Active Member of [employer] or Organizations: No    Attends Engineer, structural: Not on file    Marital Status: Never married    His Allergies Are:  No Known Allergies:   His Current Medications Are:  Outpatient Encounter Medications as of 06/28/2023  Medication Sig   cyclobenzaprine (FLEXERIL) 5 MG tablet Take 1-2 tablets (5-10 mg total) by mouth 3 (three) times daily as needed for muscle spasms.   losartan-hydrochlorothiazide (HYZAAR) 50-12.5 MG tablet Take 1 tablet by mouth daily.   XDEMVY 0.25 % SOLN    No facility-administered encounter medications on file as of 06/28/2023.  :   Review of Systems:  Out of a complete 14 point review of systems, all are reviewed and negative with the exception of these symptoms as listed below:   Review of Systems  Neurological:        Rm 9 alone Pt  is well, reports he has been told he stops breathing in his sleep. He has woken up gasping for breath and snoring. Gets about 6-7 hrs of broken sleep a day, he works night shift. No known family history of OSA.     Objective:  Neurological Exam  Physical Exam Physical Examination:   Vitals:   06/28/23 1021  BP: 132/77  Pulse: 88    General  Examination: The patient is a very pleasant 58 y.o. male in no acute distress. He appears well-developed and well-nourished and well groomed.   HEENT: Normocephalic, atraumatic, pupils are equal, round and reactive to light, extraocular tracking is good without limitation to gaze excursion or nystagmus noted. Hearing is grossly intact. Face is symmetric with normal facial animation. Speech is clear with no dysarthria noted. There is no hypophonia. There is no lip, neck/head, jaw or voice tremor. Neck is supple with full range of passive and active motion. There are no carotid bruits on auscultation. Oropharynx exam reveals: mild mouth dryness, good dental hygiene and mild airway crowding, due to smaller airway entry, tonsils 1+, Mallampati is class II. Tongue protrudes centrally and palate elevates symmetrically. Neck size is 16.5 inches. He has a minimal overbite, partial plate on top.    Chest: Clear to auscultation without wheezing, rhonchi or crackles noted.  Heart: S1+S2+0, regular and normal without murmurs, rubs or gallops noted.   Abdomen: Soft, non-tender and non-distended.  Extremities: There is no pitting edema in the distal lower extremities bilaterally. Varicose veins b/l LEs.  Skin: Warm and dry without trophic changes noted.   Musculoskeletal: exam reveals no obvious joint deformities.   Neurologically:  Mental status: The patient is awake, alert and oriented in all 4 spheres. His immediate and remote memory, attention, language skills and fund of knowledge are appropriate. There is no evidence of aphasia, agnosia, apraxia or anomia.  Speech is clear with normal prosody and enunciation. Thought process is linear. Mood is normal and affect is normal.  Cranial nerves II - XII are as described above under HEENT exam.  Motor exam: Normal bulk, strength and tone is noted. There is no obvious action or resting tremor.  Fine motor skills and coordination: grossly intact.  Cerebellar testing: No dysmetria or intention tremor. There is no truncal or gait ataxia.  Sensory exam: intact to light touch in the upper and lower extremities.  Gait, station and balance: He stands easily. No veering to one side is noted. No leaning to one side is noted. Posture is age-appropriate and stance is narrow based. Gait shows normal stride length and normal pace. No problems turning are noted.   Assessment and Plan:  In summary, Marcus Wilkins is a very pleasant 58 y.o.-year old male with an underlying medical history of hypertension, hyperlipidemia, multinodular goiter, diabetes, reflux disease, osteoarthritis, plantar fasciitis, chronic sinusitis, and overweight state, whose history and physical exam are concerning for sleep disordered breathing, particularly obstructive sleep apnea (OSA). A laboratory attended sleep study is typically considered "gold standard" for evaluation of sleep disordered breathing.   I had a long chat with the patient about my findings and the diagnosis of sleep apnea, particularly OSA, its prognosis and treatment options. We talked about medical/conservative treatments, surgical interventions and non-pharmacological approaches for symptom control. I explained, in particular, the risks and ramifications of untreated moderate to severe OSA, especially with respect to developing cardiovascular disease down the road, including congestive heart failure (CHF), difficult to treat hypertension, cardiac arrhythmias (particularly A-fib), neurovascular complications including TIA, stroke and dementia. Even type 2 diabetes has, in part, been  linked to untreated OSA. Symptoms of untreated OSA may include (but may not be limited to) daytime sleepiness, nocturia (i.e. frequent nighttime urination), memory problems, mood irritability and suboptimally controlled or worsening mood disorder such as depression and/or anxiety, lack of energy, lack of motivation, physical discomfort, as well as recurrent headaches, especially morning or nocturnal headaches.  We talked about the importance of maintaining a healthy lifestyle and striving for healthy weight. In addition, we talked about the importance of striving for and maintaining good sleep hygiene. I recommended a sleep study at this time. I outlined the differences between a laboratory attended sleep study which is considered more comprehensive and accurate over the option of a home sleep test (HST); the latter may lead to underestimation of sleep disordered breathing in some instances and does not help with diagnosing upper airway resistance syndrome and is not accurate enough to diagnose primary central sleep apnea typically. I outlined possible surgical and non-surgical treatment options of OSA, including the use of a positive airway pressure (PAP) device (i.e. CPAP, AutoPAP/APAP or BiPAP in certain circumstances), a custom-made dental device (aka oral appliance, which would require a referral to a specialist dentist or orthodontist typically, and is generally speaking not considered for patients with full dentures or edentulous state), upper airway surgical options, such as traditional UPPP (which is not considered a first-line treatment) or the Inspire device (hypoglossal nerve stimulator, which would involve a referral for consultation with an ENT surgeon, after careful selection, following inclusion criteria - also not first-line treatment). I explained the PAP treatment option to the patient in detail, as this is generally considered first-line treatment.  The patient indicated that he would be willing  to try PAP therapy, if the need arises. I explained the importance of being compliant with PAP treatment, not only for insurance purposes but primarily to improve patient's symptoms symptoms, and for the patient's long term health benefit, including to reduce His cardiovascular risks longer-term.    We will pick up our discussion about the next steps and treatment options after testing.  We will keep him posted as to the test results by phone call and/or MyChart messaging where possible.  We will plan to follow-up in sleep clinic accordingly as well.  I answered all his questions today and the patient was in agreement.   I encouraged him to call with any interim questions, concerns, problems or updates or email [place] through MyChart.  Generally speaking, sleep test authorizations may take up to 2 weeks, sometimes less, sometimes longer, the patient is encouraged to get in touch with [place] if they do not hear back from the sleep lab staff directly within the next 2 weeks.  Thank you very much for allowing me to participate in the care of this nice patient. If I can be of any further assistance to you please do not hesitate to call me at [phone].  Sincerely,   Marcus Foley, MD, PhD

## 2023-06-28 NOTE — Patient Instructions (Signed)

## 2023-07-02 ENCOUNTER — Ambulatory Visit
Admission: RE | Admit: 2023-07-02 | Discharge: 2023-07-02 | Disposition: A | Discharge status: Home / Self Care | Source: Ambulatory Visit | Attending: Otolaryngology

## 2023-07-02 ENCOUNTER — Other Ambulatory Visit (HOSPITAL_COMMUNITY)
Admission: RE | Admit: 2023-07-02 | Discharge: 2023-07-02 | Disposition: A | Discharge status: Home / Self Care | Source: Ambulatory Visit | Attending: Student | Admitting: Student

## 2023-07-02 DIAGNOSIS — E041 Nontoxic single thyroid nodule: Secondary | ICD-10-CM

## 2023-07-04 LAB — CYTOLOGY - NON PAP

## 2023-07-08 ENCOUNTER — Telehealth: Payer: Self-pay | Admitting: Neurology

## 2023-07-08 NOTE — Telephone Encounter (Signed)
 unable to leave vmail mail box full   NPSG & HST  [employer] no auth req spoke to [place] ref # [id number]

## 2023-07-24 ENCOUNTER — Other Ambulatory Visit: Payer: Self-pay

## 2023-07-24 ENCOUNTER — Ambulatory Visit
Admission: EM | Admit: 2023-07-24 | Discharge: 2023-07-24 | Disposition: A | Discharge status: Home / Self Care | Attending: Family Medicine | Admitting: Family Medicine

## 2023-07-24 DIAGNOSIS — M654 Radial styloid tenosynovitis [de Quervain]: Secondary | ICD-10-CM

## 2023-07-24 MED ORDER — MELOXICAM 15 MG PO TABS: 15.0000 mg | ORAL_TABLET | Freq: Every day | ORAL | 0 refills | Status: DC

## 2023-07-24 MED ORDER — PREDNISONE 50 MG PO TABS: ORAL_TABLET | ORAL | 0 refills | Status: DC

## 2023-07-24 NOTE — ED Triage Notes (Signed)
 C/o right wrist pain x 1 month. No known injury. No otc meds.

## 2023-07-24 NOTE — ED Provider Notes (Signed)
 [facility] Wil[facility]    CSN: [id number] Arrival date & time: 07/24/23  1854      History   Chief Complaint Chief Complaint  Patient presents with   Wrist Pain    HPI Marcus Wilkins is a 58 y.o. male.   HPI  Patient is a Merchandiser, retail at [employer].  He is a working Merchandiser, retail.  He uses his hands a lot for variety of activities.  For about a month he has been fighting right hand and wrist pain.  For the last couple of days it swollen and more painful.  He does not recall any accident injury.  He does not recall which of his work activities may have caused this.  He does not have any hobbies or activity at home that could be considered repetitive.  He did not have any fall or trauma.  Patient was previously diagnosed as prediabetes.  Through diet and exercise he has his A1c down in the 6 range.  He is not needing any medication at this time His last GFR is in the 90s  Past Medical History:  Diagnosis Date   Abdominal pain    right side radiating to left side   Allergy     DM (diabetes mellitus) (HCC)    ELECTROCARDIOGRAM, ABNORMAL 12/28/2008   Qualifier: Diagnosis of  By: Stann Earnest, MD, Lovena Rubinstein    GERD (gastroesophageal reflux disease)    hx ulcer   H. 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   Seasonal and perennial allergic rhinitis 11/24/2020   Hyperlipidemia associated with type 2 diabetes mellitus (HCC) 09/14/2016   Hypertension associated with diabetes (HCC) 09/14/2016   Diabetes mellitus without complication (HCC) 06/18/2016   Allergic rhinitis due to pollen 01/24/2015   Plantar fasciitis 07/28/2013   Osteoarthritis of both  knees 07/28/2013   GERD 12/28/2008    Past Surgical History:  Procedure Laterality Date   CHOLECYSTECTOMY N/A 06/16/2022   Procedure: LAPAROSCOPIC CHOLECYSTECTOMY;  Surgeon: Aldean Hummingbird, MD;  Location: [facility] OR;  Service: General;  Laterality: N/A;   KNEE ARTHROSCOPY W/ MENISCAL REPAIR     bilateral   WISDOM TOOTH EXTRACTION         Home Medications    Prior to Admission medications   Medication Sig Start Date End Date Taking? Authorizing Provider  meloxicam (MOBIC) 15 MG tablet Take 1 tablet (15 mg total) by mouth daily. 07/24/23  Yes Stephany Ehrich, MD  predniSONE  (DELTASONE ) 50 MG tablet Take once a day for 5 days.  Take with food 07/24/23  Yes Stephany Ehrich, MD  losartan -hydrochlorothiazide (HYZAAR) 50-12.5 MG tablet Take 1 tablet by mouth daily. 04/25/23   Colene Dauphin, MD  XDEMVY 0.25 % SOLN  04/16/23   [provider]    Family History Family History  Problem Relation Age of Onset   Colon polyps Mother    Stroke Mother    Heart disease Mother        valvular   Colon cancer Mother    Kidney disease Father    Stroke Father    Diabetes Father    Hypertension Father  Esophageal cancer Neg Hx    Rectal cancer Neg Hx    Stomach cancer Neg Hx     Social History Social History   Tobacco Use   Smoking status: Never   Smokeless tobacco: Never  Vaping Use   Vaping status: Never Used  Substance Use Topics   Alcohol use: No    Comment: 1-2 drinks a few times per year   Drug use: No     Allergies   Patient has no known allergies.   Review of Systems Review of Systems See HPI  Physical Exam Triage Vital Signs ED Triage Vitals  Encounter Vitals Group     BP 07/24/23 1900 (!) 148/89     Systolic BP Percentile --      Diastolic BP Percentile --      Pulse Rate 07/24/23 1900 78     Resp 07/24/23 1900 16     Temp 07/24/23 1900 98.2 F (36.8 C)     Temp src --      SpO2 07/24/23 1900 98 %     Weight --      Height --      Head  Circumference --      Peak Flow --      Pain Score 07/24/23 1903 6     Pain Loc --      Pain Education --      Exclude from Growth Chart --    No data found.  Updated Vital Signs BP (!) 148/89   Pulse 78   Temp 98.2 F (36.8 C)   Resp 16   SpO2 98%       Physical Exam Constitutional:      General: He is not in acute distress.    Appearance: He is well-developed and normal weight.  HENT:     Head: Normocephalic and atraumatic.  Eyes:     Conjunctiva/sclera: Conjunctivae normal.     Pupils: Pupils are equal, round, and reactive to light.  Cardiovascular:     Rate and Rhythm: Normal rate.  Pulmonary:     Effort: Pulmonary effort is normal. No respiratory distress.  Abdominal:     General: There is no distension.     Palpations: Abdomen is soft.  Musculoskeletal:        General: Swelling and tenderness present. Normal range of motion.     Cervical back: Normal range of motion.     Comments: Dorsum of right hand swollen.  There is tenderness over the anatomic snuffbox and radial styloid region.  Good range of motion of the wrist as long as he moves slow.  Severe reaction to Finkelstein's testing.  Skin:    General: Skin is warm and dry.  Neurological:     Mental Status: He is alert.      UC Treatments / Results  Labs (all labs ordered are listed, but only abnormal results are displayed) Labs Reviewed - No data to display  EKG   Radiology No results found.  Procedures Procedures (including critical care time)  Medications Ordered in UC Medications - No data to display  Initial Impression / Assessment and Plan / UC Course  I have reviewed the triage vital signs and the nursing notes.  Pertinent labs & imaging results that were available during my care of the patient were reviewed by me and considered in my medical decision making (see chart for details).     Explained dequervains tenosynovitis Final Clinical Impressions(s) / UC Diagnoses   Final  diagnoses:  Dewaine Footman disease (radial styloid tenosynovitis)     Discharge Instructions      Wear brace until you can comfortably do activity without it Ice this area for 20 minutes 2-3 times a day Take prednisone  once a day for 5 days.  Take with food.  Is generally better to take it in the morning, it may keep you awake at night Take meloxicam once a day with food after you have finished the prednisone  See your doctor if this does not take care of the pain and problems     ED Prescriptions     Medication Sig Dispense Auth. Provider   predniSONE  (DELTASONE ) 50 MG tablet Take once a day for 5 days.  Take with food 5 tablet Stephany Ehrich, MD   meloxicam (MOBIC) 15 MG tablet Take 1 tablet (15 mg total) by mouth daily. 30 tablet Stephany Ehrich, MD      PDMP not reviewed this encounter.   Stephany Ehrich, MD 07/24/23 [phone]

## 2023-07-24 NOTE — Discharge Instructions (Addendum)
 Wear brace until you can comfortably do activity without it Ice this area for 20 minutes 2-3 times a day Take prednisone  once a day for 5 days.  Take with food.  Is generally better to take it in the morning, it may keep you awake at night Take meloxicam once a day with food after you have finished the prednisone  See your doctor if this does not take care of the pain and problems

## 2023-07-28 ENCOUNTER — Ambulatory Visit (INDEPENDENT_AMBULATORY_CARE_PROVIDER_SITE_OTHER): Admitting: Neurology

## 2023-07-28 DIAGNOSIS — R0683 Snoring: Secondary | ICD-10-CM

## 2023-07-28 DIAGNOSIS — Z9189 Other specified personal risk factors, not elsewhere classified: Secondary | ICD-10-CM

## 2023-07-28 DIAGNOSIS — E663 Overweight: Secondary | ICD-10-CM

## 2023-07-28 DIAGNOSIS — G4726 Circadian rhythm sleep disorder, shift work type: Secondary | ICD-10-CM

## 2023-07-28 DIAGNOSIS — G4720 Circadian rhythm sleep disorder, unspecified type: Secondary | ICD-10-CM

## 2023-07-28 DIAGNOSIS — G4733 Obstructive sleep apnea (adult) (pediatric): Secondary | ICD-10-CM

## 2023-07-28 DIAGNOSIS — G4719 Other hypersomnia: Secondary | ICD-10-CM

## 2023-07-28 DIAGNOSIS — R0681 Apnea, not elsewhere classified: Secondary | ICD-10-CM

## 2023-08-01 NOTE — Procedures (Signed)
 Physician Interpretation:     [facility] POLYSOMNOGRAPHY  INTERPRETATION REPORT   STUDY DATE:  07/28/2023     PATIENT NAME:  Marcus Wilkins         DATE OF BIRTH:  1966-01-06  PATIENT ID:  [id number]    TYPE OF STUDY:  PSG  READING PHYSICIAN: Debbra Fairy, MD, PhD   SCORING TECHNICIAN: Octavia Belton, RPSGT   Referred by: Colene Dauphin, MD  ? History and Indication for Testing: 58 year old male with an underlying medical history of hypertension, hyperlipidemia, multinodular goiter, diabetes, reflux disease, osteoarthritis, plantar fasciitis, chronic sinusitis, and overweight state, who reports snoring and excessive daytime somnolence as well as witnessed apneas.  Height: 73 in Weight: 222 lb (BMI 29) Neck Size: 17 in      MEDICATIONS: Flexeril , Hyzaar   TECHNICAL DESCRIPTION: A registered sleep technologist was in attendance for the duration of the recording.  Data collection, scoring, video monitoring, and reporting were performed in compliance with the AASM Manual for the Scoring of Sleep and Associated Events; (Hypopnea is scored based on the criteria listed in Section VIII D. 1b in the AASM Manual V2.6 using a 4% oxygen desaturation rule or Hypopnea is scored based on the criteria listed in Section VIII D. 1a in the AASM Manual V2.6 using 3% oxygen desaturation and /or arousal rule).   SLEEP CONTINUITY AND SLEEP ARCHITECTURE:  Lights-out was at 22:17: and lights-on at  05:01:, with a total recording time of 6 hours, 44 min. Total sleep time ( TST) was 349.0 minutes with a normal sleep efficiency at 86.4%. There was  11.5% REM sleep.   BODY POSITION:  TST was divided  between the following sleep positions: 74.5% supine;  25.5% lateral;  0% prone. Duration of total sleep and percent of total sleep in their respective position is as follows: supine 260 minutes (74%), non-supine 89 minutes (26%); right 26 minutes (8%), left 62 minutes (18%), and prone 00  minutes (0%).  Total supine REM sleep time was 40 minutes (100% of total REM sleep).  Sleep latency was normal at 19.0 minutes.  REM sleep latency was normal at 75.5 minutes. Of the total sleep time, the percentage of stage N1 sleep was 6.7%, stage N2 sleep was 82%, which markedly increased, stage N3 sleep was nearly absent at 0.3%, and REM sleep was 11.5%, which is reduced. Wake after sleep onset (WASO) time accounted for 36 minutes with minimal to mild sleep fragmentation noted.   RESPIRATORY MONITORING:   Based on CMS criteria (using a 4% oxygen desaturation rule for scoring hypopneas), there were 8 apneas (6 obstructive; 0 central; 2 mixed), and 50 hypopneas.  Apnea index was 1.4. Hypopnea index was 8.6. The apnea-hypopnea index was 10.0 overall (12.7 supine, 0 non-supine; 22.5 REM, 22.5 supine REM).  There were 0 respiratory effort-related arousals (RERAs).  The RERA index was 0 events/h. Total respiratory disturbance index (RDI) was 10.0 events/h. RDI results showed: supine RDI  12.7 /h; non-supine RDI 2.0 /h; REM RDI 22.5 /h, supine REM RDI 22.5 /h.   Based on AASM criteria (using a 3% oxygen desaturation and /or arousal rule for scoring hypopneas), there were 8 apneas (6 obstructive; 0 central; 2 mixed), and 66 hypopneas. Apnea index was 1.4. Hypopnea index was 11.3. The apnea-hypopnea index was 12.7/hour overall (16.4 supine, 0 non-supine; 28.5 REM, 28.5 supine REM).  There were 0 respiratory effort-related arousals (RERAs).  The RERA index was 0 events/h. Total respiratory disturbance index (  RDI) was 12.7 events/h. RDI results showed: supine RDI  16.4 /h; non-supine RDI 2.0 /h; REM RDI 28.5 /h, supine REM RDI 28.5 /h.    OXIMETRY: Oxyhemoglobin Saturation Nadir during sleep was at  86% from a mean of 93%.  Of the Total sleep time (TST)   hypoxemia (=<88%) was present for  1.8 minutes, or 0.5% of total sleep time.   LIMB MOVEMENTS: There were 0 periodic limb movements of sleep (0.0/hr), of  which 0 (0.0/hr) were associated with an arousal.   AROUSAL: There were 45 arousals in total, for an arousal index of 8 arousals/hour.  Of these, 16 were identified as respiratory-related arousals (3 /h), 0 were PLM-related arousals (0 /h), and 41 were non-specific arousals (7 /h).   EEG: Review of the EEG showed no abnormal electrical discharges and symmetrical bihemispheric findings.    EKG: The EKG revealed normal sinus rhythm (NSR). The average heart rate during sleep was 89 bpm.   AUDIO/VIDEO REVIEW: The audio and video review did not show any abnormal or unusual behaviors, movements, phonations or vocalizations. The patient took no restroom breaks. Snoring was noted, in the mild range.  POST-STUDY QUESTIONNAIRE: Post study, the patient indicated, that sleep was better than.   IMPRESSION:   1. Mild Obstructive Sleep Apnea (OSA) 2. Dysfunctions associated with sleep stages or arousal from sleep  RECOMMENDATIONS:   1. This study demonstrates overall mild obstructive sleep apnea, more pronounced during supine and REM sleep with a total AHI of 12.7/hour, REM AHI of 28.5/hour, supine AHI of 16.4/hour and O2 nadir of 86%. Given the patient's medical history and sleep related complaints, treatment with positive airway pressure is recommended; this can be achieved in the form of autoPAP. A full-night CPAP titration study would allow optimization of therapy if needed, down the road. Other treatment options may include avoidance of supine sleep position along with weight loss (where clinically feasible and applicable), or the use of an oral appliance in selected patients.  Generally speaking, surgical treatment options are not recommended for mild obstructive sleep apnea. Please note, that untreated obstructive sleep apnea may carry additional perioperative morbidity. Patients with significant obstructive sleep apnea should receive perioperative PAP therapy and the surgeons and particularly the  anesthesiologist should be informed of the diagnosis and the severity of the sleep disordered breathing. 2. This study shows some sleep fragmentation and abnormal sleep stage percentages; these are nonspecific findings and per se do not signify an intrinsic sleep disorder or a cause for the patient's sleep-related symptoms. Causes include (but are not limited to) the first night effect of the sleep study, circadian rhythm disturbances, medication effect or an underlying mood disorder or medical problem.  3. The patient should be cautioned not to drive, work at heights, or operate dangerous or heavy equipment when tired or sleepy. Review and reiteration of good sleep hygiene measures should be pursued with any patient. 4. The patient will be seen in follow-up in the Sleep Clinic at [facility] for discussion of the test results, progress with treatment, and recheck on symptoms as well as potential further management strategies. The referring provider and the patient will be notified of the test results.  I certify that I have reviewed the entire raw data recording prior to the issuance of this report in accordance with the Standards of Accreditation of the American Academy of Sleep Medicine (AASM).  Debbra Fairy, MD, PhD Medical Director, [facility]) Diplomat, ABPN (Neurology and Sleep)  Technical Report:   General Information  Name: Marcus Wilkins, Marcus Wilkins BMI: 29.29 Physician: Debbra Fairy, MD  ID: [id number] Height: 73.0 in Technician: Octavia Belton, RPSGT  Sex: Male Weight: 222.0 lb Record: [id number]  Age: 1 [07-24-1965] Date: 07/28/2023    Medical & Medication History    Marcus Wilkins is a 58 year old male with an underlying medical history of hypertension, hyperlipidemia, multinodular goiter, diabetes, reflux disease, osteoarthritis, plantar fasciitis, chronic sinusitis, and overweight state, who reports snoring and excessive daytime somnolence  as well as witnessed apneas. 7 years ago, when he had surgery, he was encouraged to be checked out for sleep apnea. He has never had a sleep study. He works third shift, has worked third shift for over 2 decades. He is getting ready to retire, he works for the [employer]. He generally works 6 days a week, Sunday through Friday. He works typically from 10 PM to 6:30 AM or till 8 AM. He does not have a set schedule for his sleep unfortunately. He may go to bed anywhere between 9 AM and noon. He does not sleep well, he does not sleep more than a few hours at a time and essentially takes longer naps. He denies nocturia or recurrent headaches. He is not aware of any family history of sleep apnea. He is single and lives alone. He has no children, no pets in the house. He does have a TV in his bedroom and it may be on or off in his bedroom while he is sleeping. He recently saw ENT and will be having a thyroid  biopsy soon. He is a non-smoker and drinks alcohol rarely, no daily caffeine. He has lost about 30 pounds since last year. He is working on weight loss.  Flexeril , Hyzaar   Sleep Disorder      Comments   Patient arrived for a diagnostic polysomnogram. Procedure explained and all questions answered. Standard paste setup without complications. Patient slept supine, left, and right. Mild snoring was noted. Respiratory events observed, worse while supine. No significant cardiac arrhythmias noted. No significant PLMS observed. No restroom visit.    Lights out: 10:17:27 PM Lights on: 05:01:25 AM   Time Total Supine Side Prone Upright  Recording (TRT) 6h 44.73m 5h 1.14m 1h 42.89m 0h 0.42m 0h 0.54m  Sleep (TST) 5h 49.73m 4h 20.31m 1h 29.75m 0h 0.52m 0h 0.11m   Latency N1 N2 N3 REM Onset Per. Slp. Eff.  Actual 0h 0.76m 0h 8.29m 4h 10.64m 1h 15.43m 0h 19.26m 0h 25.69m 86.39%   Stg Dur Wake N1 N2 N3 REM  Total 55.0 23.5 284.5 1.0 40.0  Supine 41.5 15.5 204.5 0.0 40.0  Side 13.5 8.0 80.0 1.0 0.0  Prone 0.0 0.0 0.0 0.0 0.0   Upright 0.0 0.0 0.0 0.0 0.0   Stg % Wake N1 N2 N3 REM  Total 13.6 6.7 81.5 0.3 11.5  Supine 10.3 4.4 58.6 0.0 11.5  Side 3.3 2.3 22.9 0.3 0.0  Prone 0.0 0.0 0.0 0.0 0.0  Upright 0.0 0.0 0.0 0.0 0.0     Apnea Summary Sub Supine Side Prone Upright  Total 8 Total 8 6 2  0 0    REM 5 5 0 0 0    NREM 3 1 2  0 0  Obs 6 REM 5 5 0 0 0    NREM 1 1 0 0 0  Mix 2 REM 0 0 0 0 0    NREM 2 0 2 0 0  Cen 0 REM 0 0 0 0 0  NREM 0 0 0 0 0   Rera Summary Sub Supine Side Prone Upright  Total 0 Total 0 0 0 0 0    REM 0 0 0 0 0    NREM 0 0 0 0 0   Hypopnea Summary Sub Supine Side Prone Upright  Total 66 Total 66 65 1 0 0    REM 14 14 0 0 0    NREM 52 51 1 0 0   4% Hypopnea Summary Sub Supine Side Prone Upright  Total (4%) 50 Total 50 49 1 0 0    REM 10 10 0 0 0    NREM 40 39 1 0 0     AHI Total Obs Mix Cen  12.72 Apnea 1.38 1.03 0.34 0.00   Hypopnea 11.35 -- -- --  9.97 Hypopnea (4%) 8.60 -- -- --    Total Supine Side Prone Upright  Position AHI 12.72 16.38 2.02 0.00 0.00  REM AHI 28.50   NREM AHI 10.68   Position RDI 12.72 16.38 2.02 0.00 0.00  REM RDI 28.50   NREM RDI 10.68    4% Hypopnea Total Supine Side Prone Upright  Position AHI (4%) 9.97 12.69 2.02 0.00 0.00  REM AHI (4%) 22.50   NREM AHI (4%) 8.35   Position RDI (4%) 9.97 12.69 2.02 0.00 0.00  REM RDI (4%) 22.50   NREM RDI (4%) 8.35    Desaturation Information Threshold: 2% <100% <90% <80% <70% <60% <50% <40%  Supine 181.0 31.0 0.0 0.0 0.0 0.0 0.0  Side 17.0 0.0 0.0 0.0 0.0 0.0 0.0  Prone 0.0 0.0 0.0 0.0 0.0 0.0 0.0  Upright 0.0 0.0 0.0 0.0 0.0 0.0 0.0  Total 198.0 31.0 0.0 0.0 0.0 0.0 0.0  Index 31.1 4.9 0.0 0.0 0.0 0.0 0.0   Threshold: 3% <100% <90% <80% <70% <60% <50% <40%  Supine 105.0 30.0 0.0 0.0 0.0 0.0 0.0  Side 4.0 0.0 0.0 0.0 0.0 0.0 0.0  Prone 0.0 0.0 0.0 0.0 0.0 0.0 0.0  Upright 0.0 0.0 0.0 0.0 0.0 0.0 0.0  Total 109.0 30.0 0.0 0.0 0.0 0.0 0.0  Index 17.1 4.7 0.0 0.0 0.0 0.0 0.0   Threshold: 4%  <100% <90% <80% <70% <60% <50% <40%  Supine 59.0 24.0 0.0 0.0 0.0 0.0 0.0  Side 2.0 0.0 0.0 0.0 0.0 0.0 0.0  Prone 0.0 0.0 0.0 0.0 0.0 0.0 0.0  Upright 0.0 0.0 0.0 0.0 0.0 0.0 0.0  Total 61.0 24.0 0.0 0.0 0.0 0.0 0.0  Index 9.6 3.8 0.0 0.0 0.0 0.0 0.0   Threshold: 4% <100% <90% <80% <70% <60% <50% <40%  Supine 59 24 0 0 0 0 0  Side 2 0 0 0 0 0 0  Prone 0 0 0 0 0 0 0  Upright 0 0 0 0 0 0 0  Total 61 24 0 0 0 0 0   Awakening/Arousal Information # of Awakenings 19  Wake after sleep onset 36.48m  Wake after persistent sleep 30.53m   Arousal Assoc. Arousals Index  Apneas 1 0.2  Hypopneas 15 2.6  Leg Movements 0 0.0  Snore 0 0.0  PTT Arousals 0 0.0  Spontaneous 42 7.2  Total 58 10.0  Leg Movement Information PLMS LMs Index  Total LMs during PLMS 0 0.0  LMs w/ Microarousals 0 0.0   LM LMs Index  w/ Microarousal 0 0.0  w/ Awakening 0 0.0  w/ Resp Event 0 0.0  Spontaneous 7 1.2  Total 7  1.2     Desaturation threshold setting: 4% Minimum desaturation setting: 10 seconds SaO2 nadir: 86% The longest event was a 22 sec obstructive Hypopnea with a minimum SaO2 of 93%. The lowest SaO2 was 86% associated with a 15 sec obstructive Apnea. EKG Rates EKG Avg Max Min  Awake 94 110 75  Asleep 89 106 75  EKG Events: Tachycardia

## 2023-08-01 NOTE — Addendum Note (Signed)
 Addended by: Ricquel Foulk on: 08/01/2023 06:35 PM   Modules accepted: Orders

## 2023-08-07 ENCOUNTER — Ambulatory Visit: Payer: Self-pay | Admitting: *Deleted

## 2023-08-07 NOTE — Telephone Encounter (Signed)
 Spoke with patient and discussed his sleep study results. Pt agreed to be setup on autopap for mild osa. He is ok with [facility] as DME. He will watch for a call within 1 week and then notify us  if he has not heard. Pt is aware of the insurance compliance requirements which includes using the machine at least 4 hours at night and also being seen by our office between 30 and 90 days after setup. Pt was scheduled for Wed 7/23 at 1245 pm arrive at 1230. He thanked me for the call.  Referral sent to [facility]. Report sent to referring provider.

## 2023-08-07 NOTE — Telephone Encounter (Signed)
-----   Message from Debbra Fairy sent at 08/01/2023  6:35 PM EDT ----- Patient referred by Dr. Donnette Gal, seen by me on 06/28/23, diagnostic PSG on 07/28/23.    Please call and notify the patient that the recent sleep study did confirm the diagnosis of obstructive sleep apnea. OSA is overall mild, but worth treating to see if he feels better after treatment. To that end I recommend treatment for this in the form of autoPAP, which means, that we don't have to bring him back for a second sleep study with CPAP, but will let him try an autoPAP machine at home, through a DME company (of his choice, or as per insurance requirement). The DME representative will educate him on how to use the machine, how to put the mask on, etc. I have placed an order in the chart. Please send referral, talk to patient, send report to referring MD. We will need a FU in sleep clinic for 10 weeks post-PAP set up, please arrange that with me or one of our NPs. Thanks,   Debbra Fairy, MD, PhD [facility])

## 2023-08-08 ENCOUNTER — Encounter: Payer: Self-pay | Admitting: Internal Medicine

## 2023-08-08 DIAGNOSIS — G4733 Obstructive sleep apnea (adult) (pediatric): Secondary | ICD-10-CM | POA: Insufficient documentation

## 2023-08-08 NOTE — Telephone Encounter (Signed)
 New, Maryella Shivers, Otilio Jefferson, RN; Alain Honey; Jeris Penta, [place]; 1 other Received, thank you!

## 2023-08-12 ENCOUNTER — Ambulatory Visit (INDEPENDENT_AMBULATORY_CARE_PROVIDER_SITE_OTHER): Admitting: Otolaryngology

## 2023-08-13 ENCOUNTER — Encounter (INDEPENDENT_AMBULATORY_CARE_PROVIDER_SITE_OTHER): Payer: Self-pay | Admitting: Otolaryngology

## 2023-08-13 ENCOUNTER — Ambulatory Visit (INDEPENDENT_AMBULATORY_CARE_PROVIDER_SITE_OTHER): Admitting: Otolaryngology

## 2023-08-13 VITALS — BP 154/88 | HR 75

## 2023-08-13 DIAGNOSIS — R490 Dysphonia: Secondary | ICD-10-CM

## 2023-08-13 DIAGNOSIS — J343 Hypertrophy of nasal turbinates: Secondary | ICD-10-CM

## 2023-08-13 DIAGNOSIS — E041 Nontoxic single thyroid nodule: Secondary | ICD-10-CM | POA: Diagnosis not present

## 2023-08-13 DIAGNOSIS — R0982 Postnasal drip: Secondary | ICD-10-CM

## 2023-08-13 DIAGNOSIS — H6993 Unspecified Eustachian tube disorder, bilateral: Secondary | ICD-10-CM

## 2023-08-13 DIAGNOSIS — R0981 Nasal congestion: Secondary | ICD-10-CM | POA: Diagnosis not present

## 2023-08-13 DIAGNOSIS — J342 Deviated nasal septum: Secondary | ICD-10-CM

## 2023-08-13 DIAGNOSIS — K219 Gastro-esophageal reflux disease without esophagitis: Secondary | ICD-10-CM

## 2023-08-13 DIAGNOSIS — H6990 Unspecified Eustachian tube disorder, unspecified ear: Secondary | ICD-10-CM

## 2023-08-13 DIAGNOSIS — J3089 Other allergic rhinitis: Secondary | ICD-10-CM

## 2023-08-13 DIAGNOSIS — E049 Nontoxic goiter, unspecified: Secondary | ICD-10-CM

## 2023-08-13 MED ORDER — FLUTICASONE PROPIONATE 50 MCG/ACT NA SUSP: 2.0000 | Freq: Every day | NASAL | 6 refills | Status: AC

## 2023-08-13 MED ORDER — LEVOCETIRIZINE DIHYDROCHLORIDE 5 MG PO TABS: 5.0000 mg | ORAL_TABLET | Freq: Every evening | ORAL | 3 refills | Status: AC

## 2023-08-13 NOTE — Patient Instructions (Addendum)
-   Take Reflux Gourmet (natural supplement available on Amazon) to help with symptoms of chronic throat irritation    GamingLesson.nl - check out this website to learn more about reflux   -Avoid lying down for at least two hours after a meal or after drinking acidic beverages, like soda, or other caffeinated beverages. This can help to prevent stomach contents from flowing back into the esophagus. -Keep your head elevated while you sleep. Using an extra pillow or two can also help to prevent reflux. -Eat smaller and more frequent meals each day instead of a few large meals. This promotes digestion and can aid in preventing heartburn. -Wear loose-fitting clothes to ease pressure on the stomach, which can worsen heartburn and reflux. -Reduce excess weight around the midsection. This can ease pressure on the stomach. Such pressure can force some stomach contents back up the esophagus   Lloyd Huger Med Nasal Saline Rinse   - start nasal saline rinses with NeilMed Bottle available over the counter or online to help with nasal congestion

## 2023-08-13 NOTE — Progress Notes (Addendum)
 ENT Progress Note:   Update 08/13/23  Discussed the use of AI scribe software for clinical note transcription with the patient, who gave verbal consent to proceed.  History of Present Illness Marcus Wilkins is a 58 year old male with hx of thyroid  nodules who presents for follow-up after a benign needle aspiration result and to discuss hoarseness and chronic nasal congestion ear pressure.  He underwent a needle aspiration of a thyroid  nodule, which was found to be benign. The nodule was over four centimeters, which can lower the accuracy of needle aspiration results. He has multiple thyroid  nodules, but only the largest was sampled as others did not meet criteria for sampling. He is here for follow-up to discuss routine surveillance of the nodules.  He experiences voice changes, particularly becoming raspy after yelling or singing for extended periods. This has been a long-standing issue, even when he was younger. No history of heartburn or reflux, but he does experience postnasal drainage and allergies occasionally, for which he does not take medication.  He has a history of sinus issues and experiences trouble with pressure in his ears during airplane descents, which may be related to eustachian tube dysfunction. He has had reflux in the past, particularly after consuming spicy foods, but it does not currently bother him unless he eats something spicy.  He experiences a tickle in his throat leading to coughing occasionally, which could be related to postnasal drainage or reflux. He has a history of reflux years ago, but it is not a current issue unless triggered by spicy foods.   Records Reviewed:  Initial Evaluation  Reason for Consult: thyroid  nodule   HPI: Discussed the use of AI scribe software for clinical note transcription with the patient, who gave verbal consent to proceed.  History of Present Illness Marcus Wilkins is a 58 year old male who presents with a newly diagnosed thyroid   nodule. He was referred by his primary care doctor for evaluation of a thyroid  nodule.  A thyroid  nodule was discovered by his primary care doctor during an examination. The nodule is located on the right side of the thyroid  and measures approximately 5 centimeters on recent thyroid  U/S TiRADS-3. He has no compressive symptoms such as dysphagia, sensation of throat tightness, or odynophagia. He experiences occasional hoarseness, but it resolves spontaneously.  Thyroid  function tests conducted one month ago were normal. He denies any history of radiation exposure to the neck, except for routine X-rays, and has no personal history of major medical problems such as stroke or myocardial infarction.  He has never been a smoker.  There is no known family history of thyroid  disease, autoimmune conditions affecting the thyroid , or thyroid  cancer.   Marcus Wilkins is a 58 year old male with thyroid  nodules who presents for follow-up after a benign needle aspiration result.  He underwent a needle aspiration of a thyroid  nodule, which was found to be benign. The nodule was over four centimeters, which can lower the accuracy of needle aspiration results. He has multiple thyroid  nodules, but only the largest was sampled as others did not meet criteria for sampling. He is here for follow-up to discuss routine surveillance of the nodules.  He experiences voice changes, particularly becoming raspy after yelling or singing for extended periods. This has been a long-standing issue, even when he was younger. No history of heartburn or reflux, but he does experience postnasal drainage and allergies occasionally, for which he does not take medication.  He has  a history of sinus issues and experiences trouble with pressure in his ears during airplane descents, which may be related to eustachian tube dysfunction. He has allergies and chronic nasal congestion, which could contribute to this issue. He has had reflux in the  past, particularly after consuming spicy foods, but it does not currently bother him unless he eats something spicy.  He experiences a tickle in his throat leading to coughing occasionally, which could be related to postnasal drainage or reflux. He has a history of reflux years ago, but it is not a current issue unless triggered by spicy foods.   Records Reviewed:  PCP office visit 04/25/23 Has been told he snores and has had witnessed apnea Will refer for evaluation of OSA  Thyroid  feels enlarged Tsh Will get US  of gland     GI office visit 05/14/23  Marcus Wilkins is a 58 y.o. male - mother had CRCA in her 37's Last colonoscopy 2015 negative  Plan for colonoscopy - adenoma removed    Past Medical History:  Diagnosis Date   Abdominal pain    right side radiating to left side   Allergy     DM (diabetes mellitus) (HCC)    ELECTROCARDIOGRAM, ABNORMAL 12/28/2008   Qualifier: Diagnosis of  By: Stann Earnest, MD, Lovena Rubinstein    GERD (gastroesophageal reflux disease)    hx ulcer   H. pylori infection    Osteoarthritis    Plantar fasciitis    Post-operative nausea and vomiting    1 time   Sinusitis, chronic 12/22/2014   -hx sinus surgery, sees ENT     Past Surgical History:  Procedure Laterality Date   CHOLECYSTECTOMY N/A 06/16/2022   Procedure: LAPAROSCOPIC CHOLECYSTECTOMY;  Surgeon: Aldean Hummingbird, MD;  Location: [facility] OR;  Service: General;  Laterality: N/A;   KNEE ARTHROSCOPY W/ MENISCAL REPAIR     bilateral   WISDOM TOOTH EXTRACTION      Family History  Problem Relation Age of Onset   Colon polyps Mother    Stroke Mother    Heart disease Mother        valvular   Colon cancer Mother    Kidney disease Father    Stroke Father    Diabetes Father    Hypertension Father    Esophageal cancer Neg Hx    Rectal cancer Neg Hx    Stomach cancer Neg Hx     Social History:  reports that he has never smoked. He has never used smokeless tobacco. He reports that he does not drink  alcohol and does not use drugs.  Allergies: No Known Allergies  Medications: I have reviewed the patient's current medications.  The PMH, PSH, Medications, Allergies, and SH were reviewed and updated.  ROS: Constitutional: Negative for fever, weight loss and weight gain. Cardiovascular: Negative for chest pain and dyspnea on exertion. Respiratory: Is not experiencing shortness of breath at rest. Gastrointestinal: Negative for nausea and vomiting. Neurological: Negative for headaches. Psychiatric: The patient is not nervous/anxious  Blood pressure (!) 154/88, pulse 75, SpO2 94%. There is no height or weight on file to calculate BMI.  PHYSICAL EXAM:  Exam: Blood pressure (!) 154/88, pulse 75, SpO2 94%. There is no height or weight on file to calculate BMI.  PHYSICAL EXAM:  Exam: General: Well-developed, well-nourished Communication and Voice: Clear pitch and clarity Respiratory Respiratory effort: Equal inspiration and expiration without stridor Cardiovascular Peripheral Vascular: Warm extremities with equal color/perfusion Eyes: No nystagmus with equal extraocular motion bilaterally Neuro/Psych/Balance: Patient  oriented to person, place, and time; Appropriate mood and affect; Gait is intact with no imbalance; Cranial nerves I-XII are intact Head and Face Inspection: Normocephalic and atraumatic without mass or lesion Palpation: Facial skeleton intact without bony stepoffs Salivary Glands: No mass or tenderness Facial Strength: Facial motility symmetric and full bilaterally ENT Pinna: External ear intact and fully developed External canal: Canal is patent with intact skin Tympanic Membrane: Clear and mobile External Nose: No scar or anatomic deformity Internal Nose: Septum is deviated and S-shaped. No polyp, or purulence. Mucosal edema and erythema present.  Bilateral inferior turbinate hypertrophy.  Lips, Teeth, and gums: Mucosa and teeth intact and viable TMJ: No pain  to palpation with full mobility Oral cavity/oropharynx: No erythema or exudate, no lesions present Nasopharynx: No mass or lesion with intact mucosa Hypopharynx: Intact mucosa without pooling of secretions Larynx Glottic: Full true vocal cord mobility without lesion or mass Supraglottic: Normal appearing epiglottis and AE folds Interarytenoid Space: Moderate pachydermia&edema Subglottic Space: Patent without lesion or edema Neck Neck and Trachea: Midline trachea without mass or lesion Thyroid : No mass or nodularity Lymphatics: No lymphadenopathy  Preoperative diagnosis: intermittent hoarseness   Postoperative diagnosis:   Same + GERD LPR  Procedure: Flexible fiberoptic laryngoscopy  Surgeon: Artice Last, MD  Anesthesia: Topical lidocaine  and Afrin Complications: None Condition is stable throughout exam  Indications and consent:  The patient presents to the clinic with above symptoms. Indirect laryngoscopy view was incomplete. Thus it was recommended that they undergo a flexible fiberoptic laryngoscopy. All of the risks, benefits, and potential complications were reviewed with the patient preoperatively and verbal informed consent was obtained.  Procedure: The patient was seated upright in the clinic. Topical lidocaine  and Afrin were applied to the nasal cavity. After adequate anesthesia had occurred, I then proceeded to pass the flexible telescope into the nasal cavity. The nasal cavity was patent without rhinorrhea or polyp. The nasopharynx was also patent without mass or lesion. The base of tongue was visualized and was normal. There were no signs of pooling of secretions in the piriform sinuses. The true vocal folds were mobile bilaterally. There were no signs of glottic or supraglottic mucosal lesion or mass. There was moderate interarytenoid pachydermia and post cricoid edema. The telescope was then slowly withdrawn and the patient tolerated the procedure  throughout.    PROCEDURE NOTE: nasal endoscopy  Preoperative diagnosis: chronic nasal congestion symptoms  Postoperative diagnosis: same  Procedure: Diagnostic nasal endoscopy (16109)  Surgeon: Artice Last, M.D.  Anesthesia: Topical lidocaine  and Afrin  H&P REVIEW: The patient's history and physical were reviewed today prior to procedure. All medications were reviewed and updated as well. Complications: None Condition is stable throughout exam Indications and consent: The patient presents with symptoms of chronic sinusitis not responding to previous therapies. All the risks, benefits, and potential complications were reviewed with the patient preoperatively and informed consent was obtained. The time out was completed with confirmation of the correct procedure.   Procedure: The patient was seated upright in the clinic. Topical lidocaine  and Afrin were applied to the nasal cavity. After adequate anesthesia had occurred, the rigid nasal endoscope was passed into the nasal cavity. The nasal mucosa, turbinates, septum, and sinus drainage pathways were visualized bilaterally. This revealed no purulence or significant secretions that might be cultured. There were no polyps or sites of significant inflammation. The mucosa was intact and there was no crusting present. The scope was then slowly withdrawn and the patient tolerated the procedure well. There  were no complications or blood loss.   Studies Reviewed:  TSH normal 1 mo ago and 9 yrs ago   Thyroid  U/S 04/28/33 FINDINGS: Parenchymal Echotexture: Mildly heterogenous   Isthmus: Normal in size measuring 0.7 cm in diameter   Right lobe: Enlarged measuring 6.7 x 4.0 x 3.6 cm   Left lobe: Normal in size measuring 4.4 x 2.0 x 2.0 cm   _________________________________________________________   Estimated total number of nodules >/= 1 cm: 3   Number of spongiform nodules >/=  2 cm not described below (TR1): 0   Number of mixed  cystic and solid nodules >/= 1.5 cm not described below (TR2): 0   _________________________________________________________   There is a 2.1 x 2.0 x 1.8 cm spongiform/benign-appearing nodule within the superior pole of the right lobe of the thyroid  (labeled 1), which does not meet criteria to recommend percutaneous sampling or continued dedicated follow-up.   _________________________________________________________   Nodule # 2:   Location: Right; Mid   Maximum size: 4.9 cm; Other 2 dimensions: 3.7 x 3.6 cm   Composition: solid/almost completely solid (2)   Echogenicity: isoechoic (1)   Shape: not taller-than-wide (0)   Margins: ill-defined (0)   Echogenic foci: none (0)   ACR TI-RADS total points: 3.   ACR TI-RADS risk category: TR3 (3 points).   ACR TI-RADS recommendations:   **Given size (>/= 2.5 cm) and appearance, fine needle aspiration of this mildly suspicious nodule should be considered based on TI-RADS criteria.   _________________________________________________________   There is a 1.7 x 1.6 x 1.2 cm spongiform/benign-appearing nodule within inferior pole of the left lobe thyroid  (labeled 3), which does not meet criteria to recommend percutaneous sampling or continued dedicated follow-up.   IMPRESSION: 1. Findings suggestive of multinodular goiter. 2. Dominant 4.9 cm TI-RADS category 3 nodule within the mid right lobe of the thyroid  (labeled 2) meets imaging criteria to recommend percutaneous sampling as indicated. 3. Additional spongiform/benign-appearing nodules do not meet criteria to recommend percutaneous sampling or continued dedicated follow-up.   The above is in keeping with the ACR TI-RADS recommendations - J Am  FNA 07/02/23  clinical History: Right; Mid, 4.9 cm; Other 2 dimensions: 3.7 x 3.6 cm,  Solid/almost completely solid, Isoechoic, TI-RADS total points: 3.  Specimen Submitted:  A. THYROID , RIGHT MID, FINE NEEDLE ASPIRATION     FINAL MICROSCOPIC DIAGNOSIS:  - Benign follicular nodule (Bethesda category II)  Assessment/Plan: Encounter Diagnoses  Name Primary?   Thyroid  nodule Yes   Thyroid  goiter    Hoarseness    Chronic GERD    Environmental and seasonal allergies    Chronic nasal congestion    Post-nasal drip    Hypertrophy of both inferior nasal turbinates    Nasal septal deviation    Dysfunction of both eustachian tubes    Assessment & Plan Thyroid  nodules  4.9 cm right thyroid  nodule, TIRAD 3, FNA c/w Benign nodule Bethesda II - repeat thyroid  U/S in 1 year   Intermittent hoarseness Intermittent hoarseness when talking a lot or clearing his throat. Hx of chronic nasal congestion and GERD in the past, not on any medications for either of the two conditions currently. Flexible scope exam today with mild VF atrophy, post-nasal drainage and findings c/w GERD LPR, but no masses or lesions. B/l VF were mobile. He does have 3+ tonsillar hypertrophy and significant nasal congestion and crusting suggestive of environmental allergies. Has sx of ETD as well when flying  - medical management of GERD LPR and  chronic nasal congestion  - will consider voice therapy in the future   Chronic nasal congestion with postnasal drip nasal crusting  Chronic nasal congestion and postnasal drip with significant inflammation and crusting. Symptoms include congestion, crusting, and occasional epistaxis, exacerbated by allergies. - Start nasal saline rinses using Neomet bottle once daily for one to two months. - Start Flonase  nasal spray, two puffs each side daily. - Consider allergy  pill at night for systemic treatment of allergic inflammation.  Eustachian tube dysfunction Ear pressure during airplane descent due to eustachian tube dysfunction exacerbated by allergies and nasal congestion. Medical management of allergies and congestion is the first line of treatment. - Recommend Afrin nasal spray one to two days before  flying in severe cases. - Consider allergy  management to reduce nasal congestion.  Reflux laryngitis Intermittent hoarseness and voice changes potentially related to reflux and postnasal drainage. Mild reflux irritation observed during laryngoscopy. - Provide information on dietary modifications to reduce reflux. - Recommend seaweed-based supplement post meals to reduce reflux symptoms.  Benign thyroid  nodule Benign thyroid  nodule confirmed by needle aspiration. Nodule over four centimeters may reduce accuracy of needle aspiration. Routine surveillance recommended due to potential for growth and transformation into malignancy. - Order thyroid  ultrasound in one year to monitor nodule growth. - Consider repeat needle aspiration if nodule grows.   Update 08/13/23  Assessment & Plan Chronic Nasal Congestion Environmental Allergies Allergic rhinitis with postnasal drip Chronic nasal congestion and postnasal drip with significant inflammation and crusting on nasal endoscopy today as well as septal deviation with S-shaped septum and ITH. Symptoms include congestion, and occasional epistaxis. Not on any treatments currently.  - Start nasal saline rinses using Neilmed bottle once daily for one to two months. - Start Flonase  nasal spray, two puffs each side daily. Start Xyzal 5 mg QHS - Consider testing in the future   Eustachian tube dysfunction Ear pressure during airplane descent due to eustachian tube dysfunction exacerbated by allergies and nasal congestion. Medical management of allergies and congestion is the first line of treatment. - Recommend Afrin nasal spray one to two days before flying in severe cases. - Consider allergy  management to reduce nasal congestion.  GERD LPR Intermittent hoarseness and voice changes potentially related to reflux and postnasal drainage. Mild reflux irritation observed during laryngoscopy. - Provide information on dietary modifications to reduce reflux,  Reflux Gourmet after meals    Dysphonia intermittent  Flexible laryngoscopy with normal movement of both VF, and no lesions, mild VF atrophy noted and evidence of GERD LPR and post-nasal drainage noted  - medical management of GERD LPR and post-nasal drainage   Benign thyroid  nodule multiple thyroid  nodules  Benign thyroid  nodule confirmed by needle aspiration. Nodule over four centimeters may reduce accuracy of needle aspiration. Routine surveillance recommended due to potential for growth and transformation into malignancy. - Order thyroid  ultrasound in one year to monitor nodule growth with PCP and monitor  - Consider repeat needle aspiration if nodule grows.     Artice Last, MD Otolaryngology [facility] Phone: [phone] Fax: [phone]    08/14/2023, 7:43 AM

## 2023-08-28 NOTE — Telephone Encounter (Signed)
 Pt called stating that [facility] informed him that they never received the prescription for a cpap machine for him. Please advise.

## 2023-10-15 NOTE — Progress Notes (Addendum)
 Patient: Marcus Wilkins Date of Birth: 07-Jul-1965  Reason for Visit: Follow up History from: Patient Primary Neurologist: Buck  ASSESSMENT AND PLAN 58 y.o. year old male   OSA on CPAP  -Setup 09/24/23, early for visit, will pull data 10/28/23 and addend the note. So far, I can tell that he is using the machine > 4 hours and his AHI is well treated. He has had very good benefit thus far. Sleep is better, more restorative, more energy during the day. 6 months VV with me.   Addendum 10/28/23 SS: CPAP data looks good, 100% compliance, > 4 hours 77%, 6-12 cm water, AHI 1.4, leak 12.4.    HISTORY OF PRESENT ILLNESS: Today 10/16/23 Saw Dr. Buck in April 2025 complaining of snoring, daytime somnolence and witnessed apneas.  PSG 07/28/2023 showed overall mild sleep apnea more pronounced during supine and REM sleep with a total AHI of 12.7/hour, REM AHI of 28.5/hour, supine AHI of 16.4/hour and O2 nadir of 86%  , worth treating to see if he feels better after treatment. CPAP setup 09/24/23. He is being seen sooner than his 30 days appointment. His data is skewed by not having started until 7/1, he was supposed to get started earlier on CPAP but was delayed in getting all orders correct. He is thrilled he is sleeping better, feels more rested in the AM. Works 3rd shift for [employer]. His 30 days data is skewed since he is early, but I can see on his phone his AHI looks good at 1.3. Using pillow mask.  HISTORY  06/28/23 Dr. Buck: I saw your patient, Marcus Wilkins, upon your kind request in my sleep clinic today for initial consultation of his sleep disorder, in particular, concern for underlying obstructive sleep apnea.  The patient is unaccompanied today.  As you know, Marcus Wilkins is a 58 year old male with an underlying medical history of hypertension, hyperlipidemia, multinodular goiter, diabetes, reflux disease, osteoarthritis, plantar fasciitis, chronic sinusitis, and overweight state, who reports snoring  and excessive daytime somnolence as well as witnessed apneas.  I reviewed your office note from 04/25/2023.  7 years ago when he had surgery, he was encouraged to be checked out for sleep apnea.  He has never had a sleep study. He works third shift, has worked third shift for over 2 decades.  He is getting ready to retire, he works for the [employer].  He generally works 6 days a week, Sunday through Friday.  He works typically from 10 PM to 6:30 AM or till 8 AM.  He does not have a set schedule for his sleep unfortunately.  He may go to bed anywhere between 9 AM and noon.  He does not sleep well, he does not sleep more than a few hours at a time and essentially takes longer naps.  He denies nocturia or recurrent headaches.  He is not aware of any family history of sleep apnea.  He is single and lives alone.  He has no children, no pets in the house.  He does have a TV in his bedroom and it may be on or off in his bedroom while he is sleeping.  He recently saw ENT and will be having a thyroid  biopsy soon.  He is a non-smoker and drinks alcohol rarely, no daily caffeine.  He has lost about 30 pounds since last year.  He is working on weight loss.  REVIEW OF SYSTEMS: Out of a complete 14 system review of symptoms, the  patient complains only of the following symptoms, and all other reviewed systems are negative.  See HPI  ALLERGIES: No Known Allergies  HOME MEDICATIONS: Outpatient Medications Prior to Visit  Medication Sig Dispense Refill   cyclobenzaprine  (FLEXERIL ) 10 MG tablet Take 10 mg by mouth at bedtime.     fluticasone  (FLONASE ) 50 MCG/ACT nasal spray Place 2 sprays into both nostrils daily. 16 g 6   levocetirizine (XYZAL  ALLERGY  24HR) 5 MG tablet Take 1 tablet (5 mg total) by mouth every evening. 30 tablet 3   losartan -hydrochlorothiazide (HYZAAR) 50-12.5 MG tablet Take 1 tablet by mouth daily. 90 tablet 1   meloxicam  (MOBIC ) 15 MG tablet Take 1 tablet (15 mg total) by mouth daily. 30  tablet 0   predniSONE  (DELTASONE ) 50 MG tablet Take once a day for 5 days.  Take with food 5 tablet 0   XDEMVY 0.25 % SOLN      No facility-administered medications prior to visit.    PAST MEDICAL HISTORY: Past Medical History:  Diagnosis Date   Abdominal pain    right side radiating to left side   Allergy     DM (diabetes mellitus) (HCC)    ELECTROCARDIOGRAM, ABNORMAL 12/28/2008   Qualifier: Diagnosis of  By: Delford, MD, CODY Maude Dunnings    GERD (gastroesophageal reflux disease)    hx ulcer   H. pylori infection    Osteoarthritis    Plantar fasciitis    Post-operative nausea and vomiting    1 time   Sinusitis, chronic 12/22/2014   -hx sinus surgery, sees ENT     PAST SURGICAL HISTORY: Past Surgical History:  Procedure Laterality Date   CHOLECYSTECTOMY N/A 06/16/2022   Procedure: LAPAROSCOPIC CHOLECYSTECTOMY;  Surgeon: Tanda Locus, MD;  Location: [facility];  Service: General;  Laterality: N/A;   KNEE ARTHROSCOPY W/ MENISCAL REPAIR     bilateral   WISDOM TOOTH EXTRACTION      FAMILY HISTORY: Family History  Problem Relation Age of Onset   Colon polyps Mother    Stroke Mother    Heart disease Mother        valvular   Colon cancer Mother    Kidney disease Father    Stroke Father    Diabetes Father    Hypertension Father    Esophageal cancer Neg Hx    Rectal cancer Neg Hx    Stomach cancer Neg Hx     SOCIAL HISTORY: Social History   Socioeconomic History   Marital status: Single    Spouse name: Not on file   Number of children: 0   Years of education: Not on file   Highest education level: Bachelor's degree (e.g., BA, AB, BS)  Occupational History   Not on file  Tobacco Use   Smoking status: Never   Smokeless tobacco: Never  Vaping Use   Vaping status: Never Used  Substance and Sexual Activity   Alcohol use: No    Comment: 1-2 drinks a few times per year   Drug use: No   Sexual activity: Never  Other Topics Concern   Not on file  Social History  Narrative   Work or School: post Producer, television/film/video.  Spiritual Beliefs: Sherlean   Social Drivers of Health   Financial Resource Strain: Low Risk  (04/24/2023)   Overall Financial Resource Strain (CARDIA)    Difficulty of Paying Living Expenses: Not hard at all  Food Insecurity: No Food Insecurity (04/24/2023)   Hunger Vital Sign    Worried About Running Out  of Food in the Last Year: Never true    Ran Out of Food in the Last Year: Never true  Transportation Needs: No Transportation Needs (04/24/2023)   PRAPARE - Administrator, Civil Service (Medical): No    Lack of Transportation (Non-Medical): No  Physical Activity: Sufficiently Active (04/24/2023)   Exercise Vital Sign    Days of Exercise per Week: 5 days    Minutes of Exercise per Session: 150+ min  Stress: No Stress Concern Present (04/24/2023)   [employer] of Occupational Health - Occupational Stress Questionnaire    Feeling of Stress : Not at all  Social Connections: Moderately Isolated (04/24/2023)   Social Connection and Isolation Panel    Frequency of Communication with Friends and Family: More than three times a week    Frequency of Social Gatherings with Friends and Family: More than three times a week    Attends Religious Services: More than 4 times per year    Active Member of [employer] or Organizations: No    Attends Engineer, structural: Not on file    Marital Status: Never married  Intimate Partner Violence: Not At Risk (10/08/2023)   Received from [facility]   HITS    Over the last 12 months how often did your partner physically hurt you?: Never    Over the last 12 months how often did your partner insult you or talk down to you?: Never    Over the last 12 months how often did your partner threaten you with physical harm?: Never    Over the last 12 months how often did your partner scream or curse at you?: Never    PHYSICAL EXAM  Vitals:   10/16/23 1248  BP: (!) 147/83  Pulse: 68   Weight: 223 lb 9.6 oz (101.4 kg)  Height: 6' 1.5 (1.867 m)   Body mass index is 29.1 kg/m.  Generalized: Well developed, in no acute distress  Neurological examination  Mentation: Alert oriented to time, place, history taking. Follows all commands speech and language fluent Cranial nerve II-XII: Pupils were equal round reactive to light. Extraocular movements were full, visual field were full on confrontational test. Facial sensation and strength were normal. . Head turning and shoulder shrug  were normal and symmetric. Motor: The motor testing reveals 5 over 5 strength of all 4 extremities. Good symmetric motor tone is noted throughout.   Gait and station: Gait is normal.   DIAGNOSTIC DATA (LABS, IMAGING, TESTING) - I reviewed patient records, labs, notes, testing and imaging myself where available.  Lab Results  Component Value Date   WBC 7.6 04/25/2023   HGB 15.7 04/25/2023   HCT 48.6 04/25/2023   MCV 88.8 04/25/2023   PLT 198.0 04/25/2023      Component Value Date/Time   NA 140 04/25/2023 1130   NA 143 03/02/2021 1102   K 4.9 04/25/2023 1130   CL 102 04/25/2023 1130   CO2 30 04/25/2023 1130   GLUCOSE 91 04/25/2023 1130   BUN 14 04/25/2023 1130   BUN 13 03/02/2021 1102   CREATININE 0.84 04/25/2023 1130   CALCIUM  10.0 04/25/2023 1130   PROT 7.7 04/25/2023 1130   PROT 7.7 03/02/2021 1102   ALBUMIN 4.5 04/25/2023 1130   ALBUMIN 4.4 03/02/2021 1102   AST 23 04/25/2023 1130   ALT 22 04/25/2023 1130   ALKPHOS 94 04/25/2023 1130   BILITOT 1.2 04/25/2023 1130   BILITOT 0.8 03/02/2021 1102   GFRNONAA >60 06/17/2022  9864   Lab Results  Component Value Date   CHOL 148 04/25/2023   HDL 48.70 04/25/2023   LDLCALC 77 04/25/2023   TRIG 112.0 04/25/2023   CHOLHDL 3 04/25/2023   Lab Results  Component Value Date   HGBA1C 6.5 04/25/2023   No results found for: [id number] Lab Results  Component Value Date   TSH 0.94 04/25/2023    Lauraine Born, AGNP-C, DNP  10/16/2023, 12:52 PM [facility] [address] [place], [place] [address] [phone]

## 2023-10-16 ENCOUNTER — Encounter: Payer: Self-pay | Admitting: Neurology

## 2023-10-16 ENCOUNTER — Ambulatory Visit (INDEPENDENT_AMBULATORY_CARE_PROVIDER_SITE_OTHER): Admitting: Neurology

## 2023-10-16 VITALS — BP 147/83 | HR 68 | Ht 73.5 in | Wt 223.6 lb

## 2023-10-16 DIAGNOSIS — G4733 Obstructive sleep apnea (adult) (pediatric): Secondary | ICD-10-CM

## 2023-10-16 NOTE — Patient Instructions (Signed)
 Great to see you today! Continue CPAP use nightly minimum 4 hours Will contact you in a few weeks to pull your data Continue current settings  Follow up in 6 months virtually. Thanks!!
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
## 2023-10-22 NOTE — Patient Instructions (Addendum)
    BP should be less than 130/80   Blood work was ordered.       Medications changes include :   change BP medication to losartan -hydrochlorothiazide 100-25 mg     A referral was ordered podiatry and someone will call you to schedule an appointment.     Return in about 6 months (around 04/24/2024) for Physical Exam.

## 2023-10-23 ENCOUNTER — Ambulatory Visit (INDEPENDENT_AMBULATORY_CARE_PROVIDER_SITE_OTHER): Payer: Commercial Managed Care - PPO | Admitting: Internal Medicine

## 2023-10-23 ENCOUNTER — Encounter: Payer: Self-pay | Admitting: Internal Medicine

## 2023-10-23 VITALS — BP 130/80 | HR 98 | Temp 98.6°F | Ht 73.5 in | Wt 213.0 lb

## 2023-10-23 DIAGNOSIS — Z23 Encounter for immunization: Secondary | ICD-10-CM

## 2023-10-23 DIAGNOSIS — E1169 Type 2 diabetes mellitus with other specified complication: Secondary | ICD-10-CM | POA: Diagnosis not present

## 2023-10-23 DIAGNOSIS — I152 Hypertension secondary to endocrine disorders: Secondary | ICD-10-CM | POA: Diagnosis not present

## 2023-10-23 DIAGNOSIS — M79671 Pain in right foot: Secondary | ICD-10-CM | POA: Insufficient documentation

## 2023-10-23 DIAGNOSIS — E785 Hyperlipidemia, unspecified: Secondary | ICD-10-CM

## 2023-10-23 DIAGNOSIS — E042 Nontoxic multinodular goiter: Secondary | ICD-10-CM

## 2023-10-23 DIAGNOSIS — E119 Type 2 diabetes mellitus without complications: Secondary | ICD-10-CM | POA: Diagnosis not present

## 2023-10-23 DIAGNOSIS — E1159 Type 2 diabetes mellitus with other circulatory complications: Secondary | ICD-10-CM

## 2023-10-23 DIAGNOSIS — G4733 Obstructive sleep apnea (adult) (pediatric): Secondary | ICD-10-CM | POA: Diagnosis not present

## 2023-10-23 LAB — COMPREHENSIVE METABOLIC PANEL WITH GFR
ALT: 30 U/L (ref 0–53)
AST: 28 U/L (ref 0–37)
Albumin: 4.7 g/dL (ref 3.5–5.2)
Alkaline Phosphatase: 88 U/L (ref 39–117)
BUN: 24 mg/dL — ABNORMAL HIGH (ref 6–23)
CO2: 26 meq/L (ref 19–32)
Calcium: 9.6 mg/dL (ref 8.4–10.5)
Chloride: 100 meq/L (ref 96–112)
Creatinine, Ser: 1.54 mg/dL — ABNORMAL HIGH (ref 0.40–1.50)
GFR: 48.78 mL/min — ABNORMAL LOW (ref >60.00)
Glucose, Bld: 96 mg/dL (ref 70–99)
Potassium: 3.7 meq/L (ref 3.5–5.1)
Sodium: 134 meq/L — ABNORMAL LOW (ref 135–145)
Total Bilirubin: 1.8 mg/dL — ABNORMAL HIGH (ref 0.2–1.2)
Total Protein: 8.7 g/dL — ABNORMAL HIGH (ref 6.0–8.3)

## 2023-10-23 LAB — MICROALBUMIN / CREATININE URINE RATIO
Creatinine,U: 420.6 mg/dL
Microalb Creat Ratio: 4.5 mg/g (ref 0.0–30.0)
Microalb, Ur: 1.9 mg/dL (ref 0.0–1.9)

## 2023-10-23 LAB — LIPID PANEL
Cholesterol: 129 mg/dL (ref 0–200)
HDL: 48.4 mg/dL (ref >39.00)
LDL Cholesterol: 62 mg/dL (ref 0–99)
NonHDL: 80.56
Total CHOL/HDL Ratio: 3
Triglycerides: 92 mg/dL (ref 0.0–149.0)
VLDL: 18.4 mg/dL (ref 0.0–40.0)

## 2023-10-23 LAB — HEMOGLOBIN A1C: Hgb A1c MFr Bld: 6.7 % — ABNORMAL HIGH (ref 4.6–6.5)

## 2023-10-23 MED ORDER — LOSARTAN POTASSIUM-HCTZ 100-25 MG PO TABS: 1.0000 | ORAL_TABLET | Freq: Every day | ORAL | 2 refills | Status: AC

## 2023-10-23 NOTE — Assessment & Plan Note (Signed)
 Chronic Saw ENT and s/p biopsy 06/2023-benign

## 2023-10-23 NOTE — Addendum Note (Signed)
 Addended by: GEOFM GLADE PARAS on: 10/23/2023 03:06 PM   Modules accepted: Level of Service

## 2023-10-23 NOTE — Assessment & Plan Note (Signed)
 New Has bony growths on the dorsal aspect of his foot-was initially painful, but that has improved Has some soft tissue pain medial lower ankle and lateral foot-?  Tendinitis Referral to podiatry for further evaluation

## 2023-10-23 NOTE — Assessment & Plan Note (Signed)
 Chronic ?Using CPAP nightly ?

## 2023-10-23 NOTE — Assessment & Plan Note (Addendum)
 Chronic Lab Results  Component Value Date   LDLCALC 77 04/25/2023   Check lipid panel Not on any medication-lipids controlled without medication Regular exercise and healthy diet encouraged-stressed that consistency with his lifestyle will help prevent cholesterol medication

## 2023-10-23 NOTE — Assessment & Plan Note (Addendum)
 Chronic Blood pressure we do not controlled Has had several measures that are elevated Continue losartan -hydrochlorothiazide but increase dose to 100-25 mg Cmp Monitor BP at home Stressed regular exercise, diet low in sodium

## 2023-10-23 NOTE — Addendum Note (Signed)
 Addended by: CLAUDENE TOBIAS PARAS on: 10/23/2023 03:06 PM   Modules accepted: Orders

## 2023-10-23 NOTE — Assessment & Plan Note (Signed)
 Chronic  Lab Results  Component Value Date   HGBA1C 6.5 04/25/2023   Sugars controlled  Currently lifestyle controlled Check A1c, urine albumin/creatinine ratio Stressed regular exercise, diabetic diet

## 2023-10-25 ENCOUNTER — Ambulatory Visit: Payer: Self-pay | Admitting: Internal Medicine

## 2023-10-25 DIAGNOSIS — R944 Abnormal results of kidney function studies: Secondary | ICD-10-CM

## 2023-10-28 ENCOUNTER — Encounter: Payer: Self-pay | Admitting: Neurology

## 2023-10-29 ENCOUNTER — Ambulatory Visit: Admitting: Podiatry

## 2023-10-31 ENCOUNTER — Ambulatory Visit (INDEPENDENT_AMBULATORY_CARE_PROVIDER_SITE_OTHER)

## 2023-10-31 ENCOUNTER — Ambulatory Visit (INDEPENDENT_AMBULATORY_CARE_PROVIDER_SITE_OTHER): Admitting: Podiatry

## 2023-10-31 DIAGNOSIS — B351 Tinea unguium: Secondary | ICD-10-CM | POA: Diagnosis not present

## 2023-10-31 DIAGNOSIS — M2041 Other hammer toe(s) (acquired), right foot: Secondary | ICD-10-CM | POA: Diagnosis not present

## 2023-10-31 DIAGNOSIS — M76821 Posterior tibial tendinitis, right leg: Secondary | ICD-10-CM | POA: Diagnosis not present

## 2023-10-31 MED ORDER — TERBINAFINE HCL 250 MG PO TABS: 250.0000 mg | ORAL_TABLET | Freq: Every day | ORAL | 0 refills | Status: AC

## 2023-10-31 MED ORDER — MELOXICAM 15 MG PO TABS: 15.0000 mg | ORAL_TABLET | Freq: Every day | ORAL | 3 refills | Status: AC

## 2023-10-31 NOTE — Patient Instructions (Signed)
 VISIT SUMMARY: Today, you were seen for foot pain and nail changes. You reported soreness and a knot on your foot, which has improved but still causes discomfort, especially when wearing shoes or upon waking. You also mentioned thickening and darkening of your toenails, which you believe is due to nail fungus. Your history includes hammer toe surgery on your right second toe, and you have recently changed your footwear, which has helped with some of your symptoms.  YOUR PLAN: -POSTERIOR TIBIAL TENDINITIS, RIGHT FOOT: Posterior tibial tendinitis is inflammation of the tendon that supports the arch of your foot, causing pain and a pulling sensation. You should take meloxicam  once daily for one month, then as needed. Follow the home therapy plan for physical therapy exercises, wear a lace-up ankle brace for support, and call us  if there is no progress in one month for a possible referral to physical therapy.  -MIDFOOT OSTEOARTHRITIS, RIGHT FOOT: Midfoot osteoarthritis is a condition where the joints in the middle of your foot have mild degenerative changes, causing pain and inflammation. You can take Tylenol  or ibuprofen as needed for pain and inflammation. If the pain persists, we may consider a steroid joint injection. Surgical fusion is a last resort if the condition worsens significantly.  -ONYCHOMYCOSIS, RIGHT HALLUX AND SECOND TOENAIL: Onychomycosis is a fungal infection of the toenails, causing thickening and discoloration. You will take an oral antifungal medication for three months. Be aware of potential side effects like nausea, cramping, and diarrhea. We will reassess your condition in four to five months.  -HAMMER TOE, RIGHT SECOND TOE (IMPROVED): Hammer toe is a deformity that causes your toe to bend or curl downward instead of pointing forward. Your condition has improved with changes in footwear. Continue using appropriate footwear to prevent recurrence and monitor for any changes or  discomfort.  INSTRUCTIONS: Please follow up with us  in one month if there is no progress with your posterior tibial tendinitis for a possible referral to physical therapy. We will reassess your onychomycosis in four to five months to check for changes in your toenail.                      Contains text generated by [employer].                                 Contains text generated by [employer].    Posterior Tibial Tendinitis  Posterior tibial tendinitis is irritation of a tendon called the posterior tibial tendon. Your posterior tibial tendon is a cord-like tissue that connects bones of your lower leg and foot to a muscle that: Supports your arch. Helps you raise up on your toes. Helps you turn your foot down and in. This condition causes foot and ankle pain. It can also lead to a flat foot. What are the causes? This condition is most often caused by repeated stress to the tendon (overuse injury). It can also be caused by a sudden injury that stresses the tendon, such as landing on your foot after jumping or falling. What increases the risk? This condition is more likely to develop in: People who play a sport that involves putting a lot of pressure on the feet, such as: Basketball. Tennis. Soccer. Hockey. Runners. Females who are older than 60 years of age and are overweight. People with diabetes. People with decreased foot stability. People with flat feet. What are the signs or symptoms? Symptoms include:  Pain in the inner ankle. Pain at the arch of your foot. Pain that gets worse with running, walking, or standing. Swelling on the inside of your ankle and foot. Weakness in your ankle or foot. Inability to stand up on tiptoe. Flattening of the arch of your foot. How is this diagnosed? This condition may be diagnosed based on: Your symptoms. Your medical history. A physical exam. Tests, such as: X-ray. MRI. Ultrasound. How  is this treated? This condition may be treated by: Putting ice to the injured area. Taking NSAIDs, such as ibuprofen, to reduce pain and swelling. Wearing a special shoe or shoe insert to support your arch (orthotic). Having physical therapy. Replacing high-impact exercise with low-impact exercise, such as swimming or cycling. If your symptoms do not improve with these treatments, you may need to wear a splint, removable walking boot, or short leg cast for 6-8 weeks to keep your foot and ankle still (immobilized). Follow these instructions at home: If you have a cast, splint, or boot: Keep it clean and dry. Check the skin around it every day. Tell your health care provider about any concerns. If you have a cast: Do not stick anything inside it to scratch your skin. Doing that increases your risk of infection. You may put lotion on dry skin around the edges of the cast. Do not put lotion on the skin underneath the cast. If you have a splint or boot: Wear it as told by your health care provider. Remove it only as told by your health care provider. Loosen it if your toes tingle, become numb, or turn cold and blue. Bathing Do not take baths, swim, or use a hot tub until your health care provider approves. Ask your health care provider if you may take showers. If your cast, splint, or boot is not waterproof: Do not let it get wet. Cover it with a waterproof covering while you take a bath or a shower. Managing pain and swelling   If directed, put ice on the injured area. If you have a removable splint or boot, remove it as told by your health care provider. Put ice in a plastic bag. Place a towel between your skin and the bag or between your cast and the bag. Leave the ice on for 20 minutes, 2-3 times a day. Move your toes often to reduce stiffness and swelling. Raise (elevate) the injured area above the level of your heart while you are sitting or lying down. Activity Do not use the  injured foot to support your body weight until your health care provider says that you can. Use crutches as told by your health care provider. Do not do activities that make pain or swelling worse. Ask your health care provider when it is safe to drive if you have a cast, splint, or boot on your foot. Return to your normal activities as told by your health care provider. Ask your health care provider what activities are safe for you. Do exercises as told by your health care provider. General instructions Take over-the-counter and prescription medicines only as told by your health care provider. If you have an orthotic, use it as told by your health care provider. Keep all follow-up visits as told by your health care provider. This is important. How is this prevented? Wear footwear that is appropriate to your athletic activity. Avoid athletic activities that cause pain or swelling in your ankle or foot. Before being active, do range-of-motion and stretching exercises. If you develop  pain or swelling while training, stop training. If you have pain or swelling that does not improve after a few days of rest, see your health care provider. If you start a new athletic activity, start gradually so you can build up your strength and flexibility. Contact a health care provider if: Your symptoms get worse. Your symptoms do not improve in 6-8 weeks. You develop new, unexplained symptoms. Your splint, boot, or cast gets damaged. Summary Posterior tibial tendinitis is irritation of a tendon called the posterior tibial tendon. This condition is most often caused by repeated stress to the tendon (overuse injury). This condition causes foot pain and ankle pain. It can also lead to a flat foot. This condition may be treated by not doing high-impact activities, applying ice, having physical therapy, wearing orthotics, and wearing a cast, splint, or boot if needed. This information is not intended to replace  advice given to you by your health care provider. Make sure you discuss any questions you have with your health care provider. Document Revised: 07/08/2018 Document Reviewed: 05/15/2018 [employer] Patient Education  2020 [employer]  Posterior Tibial Tendinitis Rehab Ask your health care provider which exercises are safe for you. Do exercises exactly as told by your health care provider and adjust them as directed. It is normal to feel mild stretching, pulling, tightness, or discomfort as you do these exercises. Stop right away if you feel sudden pain or your pain gets worse. Do not begin these exercises until told by your health care provider. Stretching and range-of-motion exercises These exercises warm up your muscles and joints and improve the movement and flexibility in your ankle and foot. These exercises may also help to relieve pain. Standing wall calf stretch, knee straight   Stand with your hands against a wall. Extend your left / right leg behind you, and bend your front knee slightly. If directed, place a folded washcloth under the arch of your foot for support. Point the toes of your back foot slightly inward. Keeping your heels on the floor and your back knee straight, shift your weight toward the wall. Do not allow your back to arch. You should feel a gentle stretch in your upper left / right calf. Hold this position for 10 seconds. Repeat 10 times. Complete this exercise 2 times a day. Standing wall calf stretch, knee bent Stand with your hands against a wall. Extend your left / right leg behind you, and bend your front knee slightly. If directed, place a folded washcloth under the arch of your foot for support. Point the toes of your back foot slightly inward. Unlock your back knee so it is bent. Keep your heels on the floor. You should feel a gentle stretch deep in your lower left / right calf. Hold this position for 10 seconds. Repeat 10 times. Complete this exercise 2  times a day. Strengthening exercises These exercises build strength and endurance in your ankle and foot. Endurance is the ability to use your muscles for a long time, even after they get tired. Ankle inversion with band Secure one end of a rubber exercise band or tubing to a fixed object, such as a table leg or a pole, that will stay still when the band is pulled. Loop the other end of the band around the middle of your left / right foot. Sit on the floor facing the object with your left / right leg extended. The band or tube should be slightly tense when your foot is relaxed.  Leading with your big toe, slowly bring your left / right foot and ankle inward, toward your other foot (inversion). Hold this position for 10 seconds. Slowly return your foot to the starting position. Repeat 10 times. Complete this exercise 2 times a day. Towel curls   Sit in a chair on a non-carpeted surface, and put your feet on the floor. Place a towel in front of your feet. Keeping your heel on the floor, put your left / right foot on the towel. Pull the towel toward you by grabbing the towel with your toes and curling them under. Keep your heel on the floor while you do this. Let your toes relax. Grab the towel with your toes again. Keep going until the towel is completely underneath your foot. Repeat 10 times. Complete this exercise 2 times a day. Balance exercise This exercise improves or maintains your balance. Balance is important in preventing falls. Single leg stand Without wearing shoes, stand near a railing or in a doorway. You may hold on to the railing or door frame as needed for balance. Stand on your left / right foot. Keep your big toe down on the floor and try to keep your arch lifted. If balancing in this position is too easy, try the exercise with your eyes closed or while standing on a pillow. Hold this position for 10 seconds. Repeat 10 times. Complete this exercise 2 times a day. This  information is not intended to replace advice given to you by your health care provider. Make sure you discuss any questions you have with your health care provider.

## 2023-11-03 ENCOUNTER — Encounter: Payer: Self-pay | Admitting: Podiatry

## 2023-11-03 NOTE — Progress Notes (Signed)
 Subjective:  Patient ID: Marcus Wilkins, male    DOB: 1966-02-07,  MRN: [id number]  Chief Complaint  Patient presents with   Foot Pain    NP right foot pain -history of hammertoe correction 2nd right -still has a pin   Nail Problem    2nd right toenail turning dark and getting thick    Discussed the use of AI scribe software for clinical note transcription with the patient, who gave verbal consent to proceed.  History of Present Illness Marcus Wilkins is a 58 year old male who presents with foot pain and nail changes.  He experiences soreness and a 'little knot' on his foot, which was previously swollen but has since decreased. Tenderness is noted when pressure is applied, particularly when wearing shoes or upon waking. The pain is described as a pulling sensation that sometimes radiates from the top to the bottom of the foot and into the ankle. It is not constant and is felt more when the foot is moved in certain ways.  He has a history of hammer toe surgery on the right second toe, which involved PIPGA fusion and bile osteotomy. No history of significant foot or ankle injuries is reported.  He recently purchased [employer] shoes and does not use insoles. A change in his walking pattern is noted, mentioning that he used to walk on his toes but does so less frequently now. A previously forming hammer toe has started to straighten out after changing shoes, which reduced rubbing and discomfort.  He reports thickening and darkening of the toenails, which he attributes to nail fungus. He has not been treated for this condition before. No liver or kidney problems reported.      Objective:    Physical Exam VASCULAR: DP and PT pulse palpable. Foot is warm and well-perfused. Capillary fill time is brisk. DERMATOLOGIC: Normal skin turgor, texture, and temperature. No open lesions, rashes, or ulcerations. Onychomycosis of right hallux and second toenail. NEUROLOGIC: Normal sensation to light  touch and pressure. No paresthesias on examination. ORTHOPEDIC: Smooth pain-free range of motion of all examined joints. No ecchymosis or bruising. No gross deformity. Tenderness over dorsal midfoot at second TMT and along posterior tibial tendon on medial ankle to navicular insertion. Previous hammer toe surgery of right second toe with PIPGA fusion and bile osteotomy.       Results RADIOLOGY Right foot radiograph: Previous hammer toe proximal interphalangeal joint (PIPJ) effusion with pin fixation removed and screw fixation of second metatarsal. Mild degenerative changes in midfoot, prominence of navicular. (10/31/2023)   Assessment:   1. Posterior tibial tendon dysfunction (PTTD) of right lower extremity   2. Hammertoe of right foot   3. Onychomycosis      Plan:  Patient was evaluated and treated and all questions answered.  Assessment and Plan Assessment & Plan Posterior tibial tendinitis, right foot Posterior tibial tendinitis in the right foot causing pain and pulling sensation along the tendon, associated with flattening of the arch and exacerbated by lack of support. Pain occurs with twisting or pressing down in certain ways. - Prescribe meloxicam  once daily for one month, then as needed. - Provide a home therapy plan for physical therapy exercises. - Advise wearing a lace-up ankle brace for support. - Instruct to call if no progress in one month for referral to physical therapy. - Provide information on posterior tibial tendinitis.  Midfoot osteoarthritis, right foot Mild degenerative changes in the midfoot with inflammation in the joints at the top  of the arch. Pain is intermittent and exacerbated by certain movements, with a pulling sensation possibly due to tendon irritation or bone spur formation. Long-term management may require surgical fusion if the condition worsens significantly and affects mobility. - Recommend Tylenol  or ibuprofen for pain and inflammation as  needed. - Discuss potential for steroid joint injection if pain persists. - Explain surgical fusion as a last resort if condition worsens significantly.  Onychomycosis, right hallux and second toenail Onychomycosis affecting the right hallux and second toenail, with thickening and discoloration. The condition is chronic and requires long-term treatment. The oral antifungal medication is processed through the liver, and potential GI side effects include nausea, cramping, and diarrhea, though most patients tolerate it well. - Prescribe oral antifungal medication for three months. - Monitor for potential GI side effects such as nausea, cramping, and diarrhea. - Plan to reassess in four to five months for changes in the toenail.  Hammer toe, right second toe (improved) Previous hammer toe surgery on the right second toe with improvement. The condition has improved with changes in footwear, reducing pressure and rubbing. - Advise continued use of appropriate footwear to prevent recurrence. - Monitor for any changes or discomfort.      Return in about 2 months (around 12/31/2023) for Follow-up posterior tibial tendinitis.

## 2023-11-08 ENCOUNTER — Other Ambulatory Visit (INDEPENDENT_AMBULATORY_CARE_PROVIDER_SITE_OTHER)

## 2023-11-08 ENCOUNTER — Ambulatory Visit: Payer: Self-pay | Admitting: Internal Medicine

## 2023-11-08 DIAGNOSIS — R944 Abnormal results of kidney function studies: Secondary | ICD-10-CM

## 2023-11-08 LAB — BASIC METABOLIC PANEL WITH GFR
BUN: 15 mg/dL (ref 6–23)
CO2: 31 meq/L (ref 19–32)
Calcium: 9.7 mg/dL (ref 8.4–10.5)
Chloride: 101 meq/L (ref 96–112)
Creatinine, Ser: 0.92 mg/dL (ref 0.40–1.50)
GFR: 90.5 mL/min (ref >60.00)
Glucose, Bld: 87 mg/dL (ref 70–99)
Potassium: 3.6 meq/L (ref 3.5–5.1)
Sodium: 139 meq/L (ref 135–145)

## 2023-12-31 ENCOUNTER — Ambulatory Visit: Admitting: Podiatry

## 2024-01-13 ENCOUNTER — Ambulatory Visit (INDEPENDENT_AMBULATORY_CARE_PROVIDER_SITE_OTHER): Admitting: Podiatry

## 2024-01-13 ENCOUNTER — Encounter: Payer: Self-pay | Admitting: Podiatry

## 2024-01-13 VITALS — Ht 73.5 in | Wt 213.0 lb

## 2024-01-13 DIAGNOSIS — M76821 Posterior tibial tendinitis, right leg: Secondary | ICD-10-CM

## 2024-01-13 NOTE — Progress Notes (Signed)
  Subjective:  Patient ID: Marcus Wilkins, male    DOB: May 11, 1965,  MRN: [id number]  Chief Complaint  Patient presents with   Foot Pain    [name] Patient is here to f/u on PTTD. Pt states using the support brace with little relief.    58 y.o. male presents with the above complaint. History confirmed with patient.  He has been using the Tri-Lock brace not quite as intense of the pain as it was able to work now.  Objective:  Physical Exam: warm, good capillary refill, no trophic changes or ulcerative lesions, normal DP and PT pulses, normal sensory exam, onychomycosis, and still pain and tenderness around the insertion of the TA tendon and PT tendon.   Prior radiographs shows mild midfoot degenerative changes no fracture accessory navicular   Assessment:   1. Posterior tibial tendon dysfunction (PTTD) of right lower extremity      Plan:  Patient was evaluated and treated and all questions answered.  Has had some improvement continue utilize the Tri-Lock ankle brace and anti-inflammatories as needed.  I recommended formal physical therapy for strengthening balance and stability training referral was sent for this.  Return in 6 weeks to reevaluate if no improvement or worsens before then would recommend MRI and surgical considerations.  Return in about 6 weeks (around 02/24/2024) for f/u PTTD.

## 2024-01-31 NOTE — Therapy (Signed)
 OUTPATIENT PHYSICAL THERAPY LOWER EXTREMITY EVALUATION   Patient Name: Marcus Wilkins MRN: [id number] DOB:[birth year], 58 y.o., male Today's Date: 02/03/2024  END OF SESSION:  PT End of Session - 02/03/24 0820     Visit Number 1    Number of Visits 12    Date for Recertification  04/04/24    Authorization Type [employer]    PT Start Time 0745    PT Stop Time 0830    PT Time Calculation (min) 45 min    Activity Tolerance Patient tolerated treatment well    Behavior During Therapy WFL for tasks assessed/performed          Past Medical History:  Diagnosis Date   Abdominal pain    right side radiating to left side   Allergy     DM (diabetes mellitus) (HCC)    ELECTROCARDIOGRAM, ABNORMAL 12/28/2008   Qualifier: Diagnosis of  By: Delford, MD, CODY Maude Dunnings    GERD (gastroesophageal reflux disease)    hx ulcer   H. pylori infection    Osteoarthritis    Plantar fasciitis    Post-operative nausea and vomiting    1 time   Sinusitis, chronic 12/22/2014   -hx sinus surgery, sees ENT    Past Surgical History:  Procedure Laterality Date   CHOLECYSTECTOMY N/A 06/16/2022   Procedure: LAPAROSCOPIC CHOLECYSTECTOMY;  Surgeon: Tanda Locus, MD;  Location: [facility] OR;  Service: General;  Laterality: N/A;   KNEE ARTHROSCOPY W/ MENISCAL REPAIR     bilateral   WISDOM TOOTH EXTRACTION     Patient Active Problem List   Diagnosis Date Noted   Right foot pain 10/23/2023   OSA on CPAP, mild 08/08/2023   Multinodular goiter 04/25/2023   Trapezius strain, right, initial encounter 04/25/2023   Acquired trigger finger 09/27/2021   Pain in finger 09/27/2021   Allergic urticaria 11/24/2020   Seasonal and perennial allergic rhinitis 11/24/2020   Hyperlipidemia associated with type 2 diabetes mellitus (HCC) 09/14/2016   Hypertension associated with diabetes (HCC) 09/14/2016   Diabetes mellitus without complication (HCC) 06/18/2016   Allergic rhinitis due to pollen 01/24/2015   Plantar fasciitis  07/28/2013   Osteoarthritis of both knees 07/28/2013   GERD 12/28/2008    PCP: Geofm Glade PARAS, MD   REFERRING PROVIDER: Silva Juliene SAUNDERS, DPM  REFERRING DIAG: [id number] (ICD-10-CM) - Posterior tibial tendon dysfunction (PTTD) of right lower extremity  THERAPY DIAG:  Acute right ankle pain  Muscle weakness (generalized)  Rationale for Evaluation and Treatment: Rehabilitation  ONSET DATE: 3-4 months  SUBJECTIVE:   SUBJECTIVE STATEMENT:  Foot Pain      [name] Patient is here to f/u on PTTD. Pt states using the support brace with little relief.      58 y.o. male presents with the above complaint. History confirmed with patient.  He has been using the Tri-Lock brace not quite as intense of the pain as it was able to work now. Pain ongoing 3-4 months, insidious onset, localized to R arch and dorsum of foot, no better since onset, bracing ineffective. PERTINENT HISTORY: Has had some improvement continue utilize the Tri-Lock ankle brace and anti-inflammatories as needed.  I recommended formal physical therapy for strengthening balance and stability training referral was sent for this.  Return in 6 weeks to reevaluate if no improvement or worsens before then would recommend MRI and surgical considerations.   Return in about 6 weeks (around 02/24/2024) for f/u PTTD.    PAIN:  Are you having pain? Yes: NPRS  scale: 10/10 Pain location: R ankle/foot Pain description: sharp, ache Aggravating factors: initial step, stretch Relieving factors: rest  PRECAUTIONS: None  RED FLAGS: None   WEIGHT BEARING RESTRICTIONS: No  FALLS:  Has patient fallen in last 6 months? No   OCCUPATION: paramedic  PLOF: Independent  PATIENT GOALS: To manage my ankle pain  NEXT MD VISIT: 02/25/24  OBJECTIVE:  Note: Objective measures were completed at Evaluation unless otherwise noted.  DIAGNOSTIC FINDINGS: none  PATIENT SURVEYS:  LEFS 19/56 (did not fill out 3rd page)     MUSCLE  LENGTH: Not tested  POSTURE: pes planus, talar tilt  PALPATION: Mild TTP posterior tibialis    LOWER EXTREMITY ROM:  Passive ROM Right eval Left eval  Hip flexion    Hip extension    Hip abduction    Hip adduction    Hip internal rotation    Hip external rotation    Knee flexion    Knee extension    Ankle dorsiflexion 8/10d   Ankle plantarflexion WFL   Ankle inversion    Ankle eversion     (Blank rows = not tested)  LOWER EXTREMITY MMT:  MMT Right eval Left eval  Hip flexion    Hip extension    Hip abduction    Hip adduction    Hip internal rotation    Hip external rotation    Knee flexion    Knee extension    Ankle dorsiflexion    Ankle plantarflexion 4-   Ankle inversion    Ankle eversion     (Blank rows = not tested)  LOWER EXTREMITY SPECIAL TESTS:  Ankle special tests: Talar tilt test: positive , Tinel's test-Posterior tibialis: negative, and positive pes planus  FUNCTIONAL TESTS:  30 seconds chair stand test 4  GAIT: Distance walked: 8ftx2 Assistive device utilized: None Level of assistance: Complete Independence Comments: slower cadence, slightly antalgic                                                                                                                                TREATMENT: [facility] Adult PT Treatment:                                                DATE: 02/03/24 Eval and HEP Self Care: Additional minutes spent for educating on updated Therapeutic Home Exercise Program as well as comparing current status to condition at start of symptoms. This included exercises focusing on stretching, strengthening, with focus on eccentric aspects. Long term goals include an improvement in range of motion, strength, endurance as well as avoiding reinjury. Patient's frequency would include in 1-2 times a day, 3-5 times a week for a duration of 6-12 weeks. Proper technique shown and discussed handout in great detail. All questions were discussed and  addressed.  PATIENT EDUCATION:  Education details: Discussed eval findings, rehab rationale and POC and patient is in agreement  Person educated: Patient Education method: Explanation and Handouts Education comprehension: verbalized understanding and needs further education  HOME EXERCISE PROGRAM: Access Code: [email or web address] URL: [email or web address] Date: 02/03/2024 Prepared by: Reyes Kohut  Exercises - Long Sitting Plantar Fascia Stretch with Towel  - 2-3 x daily - 5 x weekly - 1 sets - 2 reps - 30s hold - Ankle Inversion Eversion Towel Slide  - 2-3 x daily - 5 x weekly - 2 sets - 10 reps - Towel Scrunches  - 2-3 x daily - 5 x weekly - 2 sets - 15 reps  ASSESSMENT:  CLINICAL IMPRESSION: Patient is a 58 y.o. male who was seen today for physical therapy evaluation and treatment for R foot/ankle pain.  Patient presents with pes planus and talar tilt dysfunction.  ROM and strength deficits identified.  TTP along posterior tibialis tendon.  Patient unable to supinate in SLS or unilateral heel raise.  Patient is a good rehab candidate and may benefit from arch supports and more supportive footwear.   OBJECTIVE IMPAIRMENTS: Abnormal gait, decreased activity tolerance, decreased knowledge of condition, decreased mobility, difficulty walking, decreased ROM, decreased strength, hypomobility, postural dysfunction, and pain.   ACTIVITY LIMITATIONS: standing and stairs  PERSONAL FACTORS: Age, Fitness, Past/current experiences, and Time since onset of injury/illness/exacerbation are also affecting patient's functional outcome.   REHAB POTENTIAL: Good  CLINICAL DECISION MAKING: Stable/uncomplicated  EVALUATION COMPLEXITY: Moderate   GOALS: Goals reviewed with patient? No  SHORT TERM GOALS: Target date: 02/24/2024   Patient to demonstrate independence in HEP  Baseline: [id number] Goal status: INITIAL  2.  Patient to bring in footwear for assessment of support and  cushioning Baseline: TBD Goal status: INITIAL   LONG TERM GOALS: Target date: 03/30/2024    Patient will acknowledge 4/10 pain at least once during episode of care   Baseline: 10/10 worst pain Goal status: INITIAL  2.  Patient will increase 30s chair stand reps from 4 to 10 without arms to demonstrate and improved functional ability with less pain/difficulty as well as reduce fall risk.  Baseline: 4 Goal status: INITIAL  3.  Patient will score at least 60/80 on LEFS to signify clinically meaningful improvement in functional abilities.   Baseline: 19/56 Goal status: INITIAL  4.  Increase AROM R DF to 15d Baseline: 8d Goal status: INITIAL  5.  Increase R PF strength to 4/5 Baseline: 4-/5 Goal status: INITIAL     PLAN:  PT FREQUENCY: 1-2x/week  PT DURATION: 6 weeks  PLANNED INTERVENTIONS: 97110-Therapeutic exercises, 97530- Therapeutic activity, 97112- Neuromuscular re-education, 97535- Self Care, 02859- Manual therapy, [phone]- Gait training, Patient/Family education, Balance training, and Stair training  PLAN FOR NEXT SESSION: HEP review and update, manual techniques as appropriate, aerobic tasks, ROM and flexibility activities, strengthening and PREs, TPDN, gait and balance training,aquatic therapy, modalities for pain and NMRE      HAIK MAHONEY, PT 02/03/2024, 8:26 AM

## 2024-02-03 ENCOUNTER — Ambulatory Visit: Attending: Podiatry

## 2024-02-03 ENCOUNTER — Other Ambulatory Visit: Payer: Self-pay

## 2024-02-03 DIAGNOSIS — M25571 Pain in right ankle and joints of right foot: Secondary | ICD-10-CM | POA: Insufficient documentation

## 2024-02-03 DIAGNOSIS — M6281 Muscle weakness (generalized): Secondary | ICD-10-CM | POA: Insufficient documentation

## 2024-02-03 DIAGNOSIS — M76821 Posterior tibial tendinitis, right leg: Secondary | ICD-10-CM | POA: Diagnosis not present

## 2024-02-06 ENCOUNTER — Ambulatory Visit

## 2024-02-06 DIAGNOSIS — M25571 Pain in right ankle and joints of right foot: Secondary | ICD-10-CM

## 2024-02-06 DIAGNOSIS — M6281 Muscle weakness (generalized): Secondary | ICD-10-CM

## 2024-02-06 NOTE — Therapy (Signed)
 OUTPATIENT PHYSICAL THERAPY LOWER EXTREMITY EVALUATION   Patient Name: Marcus Wilkins MRN: [id number] DOB:31-Aug-1965, 58 y.o., male Today's Date: 02/06/2024  END OF SESSION:  PT End of Session - 02/06/24 1019     Visit Number 2    Number of Visits 12    Date for Recertification  04/04/24    Authorization Type [employer]    PT Start Time 1020   pt late   PT Stop Time 1100    PT Time Calculation (min) 40 min    Activity Tolerance Patient tolerated treatment well    Behavior During Therapy WFL for tasks assessed/performed           Past Medical History:  Diagnosis Date   Abdominal pain    right side radiating to left side   Allergy     DM (diabetes mellitus) (HCC)    ELECTROCARDIOGRAM, ABNORMAL 12/28/2008   Qualifier: Diagnosis of  By: Delford, MD, CODY Maude Dunnings    GERD (gastroesophageal reflux disease)    hx ulcer   H. pylori infection    Osteoarthritis    Plantar fasciitis    Post-operative nausea and vomiting    1 time   Sinusitis, chronic 12/22/2014   -hx sinus surgery, sees ENT    Past Surgical History:  Procedure Laterality Date   CHOLECYSTECTOMY N/A 06/16/2022   Procedure: LAPAROSCOPIC CHOLECYSTECTOMY;  Surgeon: Tanda Locus, MD;  Location: [facility] OR;  Service: General;  Laterality: N/A;   KNEE ARTHROSCOPY W/ MENISCAL REPAIR     bilateral   WISDOM TOOTH EXTRACTION     Patient Active Problem List   Diagnosis Date Noted   Right foot pain 10/23/2023   OSA on CPAP, mild 08/08/2023   Multinodular goiter 04/25/2023   Trapezius strain, right, initial encounter 04/25/2023   Acquired trigger finger 09/27/2021   Pain in finger 09/27/2021   Allergic urticaria 11/24/2020   Seasonal and perennial allergic rhinitis 11/24/2020   Hyperlipidemia associated with type 2 diabetes mellitus (HCC) 09/14/2016   Hypertension associated with diabetes (HCC) 09/14/2016   Diabetes mellitus without complication (HCC) 06/18/2016   Allergic rhinitis due to pollen 01/24/2015   Plantar  fasciitis 07/28/2013   Osteoarthritis of both knees 07/28/2013   GERD 12/28/2008    PCP: Geofm Glade PARAS, MD   REFERRING PROVIDER: Silva Juliene SAUNDERS, DPM  REFERRING DIAG: [phone] (ICD-10-CM) - Posterior tibial tendon dysfunction (PTTD) of right lower extremity  THERAPY DIAG:  Acute right ankle pain  Muscle weakness (generalized)  Rationale for Evaluation and Treatment: Rehabilitation  ONSET DATE: 3-4 months  SUBJECTIVE:   SUBJECTIVE STATEMENT: Pt feels alright today. Pain is feeling a little better and has been doing HEP consistently.     Foot Pain      [name] Patient is here to f/u on PTTD. Pt states using the support brace with little relief.      58 y.o. male presents with the above complaint. History confirmed with patient.  He has been using the Tri-Lock brace not quite as intense of the pain as it was able to work now. Pain ongoing 3-4 months, insidious onset, localized to R arch and dorsum of foot, no better since onset, bracing ineffective. PERTINENT HISTORY: Has had some improvement continue utilize the Tri-Lock ankle brace and anti-inflammatories as needed.  I recommended formal physical therapy for strengthening balance and stability training referral was sent for this.  Return in 6 weeks to reevaluate if no improvement or worsens before then would recommend MRI and surgical considerations.  Return in about 6 weeks (around 02/24/2024) for f/u PTTD.    PAIN:  8C Are you having pain? Yes: NPRS scale: 10/10 Pain location: R ankle/foot Pain description: sharp, ache Aggravating factors: initial step, stretch Relieving factors: rest  PRECAUTIONS: None  RED FLAGS: None   WEIGHT BEARING RESTRICTIONS: No  FALLS:  Has patient fallen in last 6 months? No   OCCUPATION: paramedic  PLOF: Independent  PATIENT GOALS: To manage my ankle pain  NEXT MD VISIT: 02/25/24  OBJECTIVE:  Note: Objective measures were completed at Evaluation unless otherwise  noted.  DIAGNOSTIC FINDINGS: none  PATIENT SURVEYS:  LEFS 19/56 (did not fill out 3rd page)     MUSCLE LENGTH: Not tested  POSTURE: pes planus, talar tilt  PALPATION: Mild TTP posterior tibialis    LOWER EXTREMITY ROM:  Passive ROM Right eval Left eval  Hip flexion    Hip extension    Hip abduction    Hip adduction    Hip internal rotation    Hip external rotation    Knee flexion    Knee extension    Ankle dorsiflexion 8/10d   Ankle plantarflexion WFL   Ankle inversion    Ankle eversion     (Blank rows = not tested)  LOWER EXTREMITY MMT:  MMT Right eval Left eval  Hip flexion    Hip extension    Hip abduction    Hip adduction    Hip internal rotation    Hip external rotation    Knee flexion    Knee extension    Ankle dorsiflexion    Ankle plantarflexion 4-   Ankle inversion    Ankle eversion     (Blank rows = not tested)  LOWER EXTREMITY SPECIAL TESTS:  Ankle special tests: Talar tilt test: positive , Tinel's test-Posterior tibialis: negative, and positive pes planus  FUNCTIONAL TESTS:  30 seconds chair stand test 4  GAIT: Distance walked: 64ftx2 Assistive device utilized: None Level of assistance: Complete Independence Comments: slower cadence, slightly antalgic                                                                                                                                TREATMENT: Treatment 02/06/24 Therapeutic Exercise: Hep reassessment and update Toe scrunches 2x8x3s Toe extension 2x8x3s Red TB clamshell x12x3s   Neuromuscular Re-Education Functional Reassessment  Red TB circles x8 CW/CCW Red TB inversion x8x3s Red TB eversion x8x3s Red TB PF x8x3s   [facility] Adult PT Treatment:                                                DATE: 02/03/24 Eval and HEP Self Care: Additional minutes spent for educating on updated Therapeutic Home Exercise Program as well as comparing current status to condition at start of  symptoms. This included exercises focusing on stretching, strengthening, with focus on eccentric aspects. Long term goals include an improvement in range of motion, strength, endurance as well as avoiding reinjury. Patient's frequency would include in 1-2 times a day, 3-5 times a week for a duration of 6-12 weeks. Proper technique shown and discussed handout in great detail. All questions were discussed and addressed.     PATIENT EDUCATION:  Education details: Discussed eval findings, rehab rationale and POC and patient is in agreement  Person educated: Patient Education method: Explanation and Handouts Education comprehension: verbalized understanding and needs further education  HOME EXERCISE PROGRAM: Access Code: [email or web address] URL: [email or web address] Date: 02/03/2024 Prepared by: Reyes Kohut  Exercises - Long Sitting Plantar Fascia Stretch with Towel  - 2-3 x daily - 5 x weekly - 1 sets - 2 reps - 30s hold - Ankle Inversion Eversion Towel Slide  - 2-3 x daily - 5 x weekly - 2 sets - 10 reps - Towel Scrunches  - 2-3 x daily - 5 x weekly - 2 sets - 15 reps Red TB circles CW/CCW Red tb PF    ASSESSMENT:  CLINICAL IMPRESSION: Patient tolerated treatment with no increases in pain with progressions in R ankle loading and stability/control. Current deficits include: excessive pain of R ankle, strength, stability/control, and functional activity. As a result, patient would continue to benefit from skilled PT to address said deficits via plan below.     EVAL: Patient is a 58 y.o. male who was seen today for physical therapy evaluation and treatment for R foot/ankle pain.  Patient presents with pes planus and talar tilt dysfunction.  ROM and strength deficits identified.  TTP along posterior tibialis tendon.  Patient unable to supinate in SLS or unilateral heel raise.  Patient is a good rehab candidate and may benefit from arch supports and more supportive footwear.   OBJECTIVE  IMPAIRMENTS: Abnormal gait, decreased activity tolerance, decreased knowledge of condition, decreased mobility, difficulty walking, decreased ROM, decreased strength, hypomobility, postural dysfunction, and pain.   ACTIVITY LIMITATIONS: standing and stairs  PERSONAL FACTORS: Age, Fitness, Past/current experiences, and Time since onset of injury/illness/exacerbation are also affecting patient's functional outcome.   REHAB POTENTIAL: Good  CLINICAL DECISION MAKING: Stable/uncomplicated  EVALUATION COMPLEXITY: Moderate   GOALS: Goals reviewed with patient? No  SHORT TERM GOALS: Target date: 02/24/2024   Patient to demonstrate independence in HEP  Baseline: [id number] Goal status: INITIAL  2.  Patient to bring in footwear for assessment of support and cushioning Baseline: TBD Goal status: INITIAL   LONG TERM GOALS: Target date: 03/30/2024    Patient will acknowledge 4/10 pain at least once during episode of care   Baseline: 10/10 worst pain Goal status: INITIAL  2.  Patient will increase 30s chair stand reps from 4 to 10 without arms to demonstrate and improved functional ability with less pain/difficulty as well as reduce fall risk.  Baseline: 4 Goal status: INITIAL  3.  Patient will score at least 60/80 on LEFS to signify clinically meaningful improvement in functional abilities.   Baseline: 19/56 Goal status: INITIAL  4.  Increase AROM R DF to 15d Baseline: 8d Goal status: INITIAL  5.  Increase R PF strength to 4/5 Baseline: 4-/5 Goal status: INITIAL     PLAN:  PT FREQUENCY: 1-2x/week  PT DURATION: 6 weeks  PLANNED INTERVENTIONS: 97110-Therapeutic exercises, 97530- Therapeutic activity, [id number]- Neuromuscular re-education, 97535- Self Care, 02859- Manual therapy, [phone]- Gait training, Patient/Family education, Balance training, and Stair training  PLAN FOR NEXT SESSION: HEP assessment and progression, symptom modulation, and loading (isolated and/or functional).  Manual therapy, aerobic, gait, and NME training as needed. R ankle strength and control, BL hip and BL LE strengthening.     Washington Greener  Cormick Moss, PT 02/06/2024, 10:59 AM

## 2024-02-13 ENCOUNTER — Ambulatory Visit

## 2024-02-13 DIAGNOSIS — M25571 Pain in right ankle and joints of right foot: Secondary | ICD-10-CM | POA: Diagnosis not present

## 2024-02-13 DIAGNOSIS — M6281 Muscle weakness (generalized): Secondary | ICD-10-CM

## 2024-02-13 NOTE — Therapy (Addendum)
 OUTPATIENT PHYSICAL THERAPY TREATMENT NOTE   Patient Name: Marcus Wilkins MRN: [id number] DOB:Oct 31, 1965, 58 y.o., male Today's Date: 02/13/2024  END OF SESSION:  PT End of Session - 02/13/24 1137     Visit Number 3    Number of Visits 12    Date for Recertification  04/04/24    Authorization Type [employer]    PT Start Time 1135    PT Stop Time 1210    PT Time Calculation (min) 35 min    Activity Tolerance Patient tolerated treatment well    Behavior During Therapy WFL for tasks assessed/performed          Past Medical History:  Diagnosis Date   Abdominal pain    right side radiating to left side   Allergy     DM (diabetes mellitus) (HCC)    ELECTROCARDIOGRAM, ABNORMAL 12/28/2008   Qualifier: Diagnosis of  By: Delford, MD, CODY Maude Dunnings    GERD (gastroesophageal reflux disease)    hx ulcer   H. pylori infection    Osteoarthritis    Plantar fasciitis    Post-operative nausea and vomiting    1 time   Sinusitis, chronic 12/22/2014   -hx sinus surgery, sees ENT    Past Surgical History:  Procedure Laterality Date   CHOLECYSTECTOMY N/A 06/16/2022   Procedure: LAPAROSCOPIC CHOLECYSTECTOMY;  Surgeon: Tanda Locus, MD;  Location: [facility] OR;  Service: General;  Laterality: N/A;   KNEE ARTHROSCOPY W/ MENISCAL REPAIR     bilateral   WISDOM TOOTH EXTRACTION     Patient Active Problem List   Diagnosis Date Noted   Right foot pain 10/23/2023   OSA on CPAP, mild 08/08/2023   Multinodular goiter 04/25/2023   Trapezius strain, right, initial encounter 04/25/2023   Acquired trigger finger 09/27/2021   Pain in finger 09/27/2021   Allergic urticaria 11/24/2020   Seasonal and perennial allergic rhinitis 11/24/2020   Hyperlipidemia associated with type 2 diabetes mellitus (HCC) 09/14/2016   Hypertension associated with diabetes (HCC) 09/14/2016   Diabetes mellitus without complication (HCC) 06/18/2016   Allergic rhinitis due to pollen 01/24/2015   Plantar fasciitis 07/28/2013    Osteoarthritis of both knees 07/28/2013   GERD 12/28/2008    PCP: Geofm Glade PARAS, MD   REFERRING PROVIDER: Silva Juliene SAUNDERS, DPM  REFERRING DIAG: [phone] (ICD-10-CM) - Posterior tibial tendon dysfunction (PTTD) of right lower extremity  THERAPY DIAG:  Acute right ankle pain  Muscle weakness (generalized)  Rationale for Evaluation and Treatment: Rehabilitation  ONSET DATE: 3-4 months  SUBJECTIVE:   SUBJECTIVE STATEMENT: Patient reports having some soreness, along with some pain especially after working.       Foot Pain      [name] Patient is here to f/u on PTTD. Pt states using the support brace with little relief.      58 y.o. male presents with the above complaint. History confirmed with patient.  He has been using the Tri-Lock brace not quite as intense of the pain as it was able to work now. Pain ongoing 3-4 months, insidious onset, localized to R arch and dorsum of foot, no better since onset, bracing ineffective. PERTINENT HISTORY: Has had some improvement continue utilize the Tri-Lock ankle brace and anti-inflammatories as needed.  I recommended formal physical therapy for strengthening balance and stability training referral was sent for this.  Return in 6 weeks to reevaluate if no improvement or worsens before then would recommend MRI and surgical considerations.   Return in about 6 weeks (  around 02/24/2024) for f/u PTTD.    PAIN:  8C Are you having pain? Yes: NPRS scale: 10/10 Pain location: R ankle/foot Pain description: sharp, ache Aggravating factors: initial step, stretch Relieving factors: rest  PRECAUTIONS: None  RED FLAGS: None   WEIGHT BEARING RESTRICTIONS: No  FALLS:  Has patient fallen in last 6 months? No   OCCUPATION: paramedic  PLOF: Independent  PATIENT GOALS: To manage my ankle pain  NEXT MD VISIT: 02/25/24  OBJECTIVE:  Note: Objective measures were completed at Evaluation unless otherwise noted.  DIAGNOSTIC FINDINGS:  none  PATIENT SURVEYS:  LEFS 19/56 (did not fill out 3rd page)     MUSCLE LENGTH: Not tested  POSTURE: pes planus, talar tilt  PALPATION: Mild TTP posterior tibialis    LOWER EXTREMITY ROM:  Passive ROM Right eval Left eval  Hip flexion    Hip extension    Hip abduction    Hip adduction    Hip internal rotation    Hip external rotation    Knee flexion    Knee extension    Ankle dorsiflexion 8/10d   Ankle plantarflexion WFL   Ankle inversion    Ankle eversion     (Blank rows = not tested)  LOWER EXTREMITY MMT:  MMT Right eval Left eval  Hip flexion    Hip extension    Hip abduction    Hip adduction    Hip internal rotation    Hip external rotation    Knee flexion    Knee extension    Ankle dorsiflexion    Ankle plantarflexion 4-   Ankle inversion    Ankle eversion     (Blank rows = not tested)  LOWER EXTREMITY SPECIAL TESTS:  Ankle special tests: Talar tilt test: positive , Tinel's test-Posterior tibialis: negative, and positive pes planus  FUNCTIONAL TESTS:  30 seconds chair stand test 4  GAIT: Distance walked: 80ftx2 Assistive device utilized: None Level of assistance: Complete Independence Comments: slower cadence, slightly antalgic                                                                                                                                TREATMENT: [facility] Adult PT Treatment:                                                DATE: 02/13/24 Therapeutic Exercise: Seated toe scrunches 2x1' Seated toe yoga x2' Neuromuscular re-ed: Seated arch lifting 2x15 Seated rocker board PF/DF/inv/ev x25 Seated heel raises with ball btw heels 2x15 Seated heel raises on 4 step 2x10 Seated DF toe raises 2x10 STS with RLE back 2x10 (from low table)   Treatment 02/06/24 Therapeutic Exercise: Hep reassessment and update Toe scrunches 2x8x3s Toe extension 2x8x3s Red TB clamshell x12x3s   Neuromuscular Re-Education Functional  Reassessment  Red TB circles x8 CW/CCW Red TB inversion x8x3s Red TB eversion x8x3s Red TB PF x8x3s   [facility] Adult PT Treatment:                                                DATE: 02/03/24 Eval and HEP Self Care: Additional minutes spent for educating on updated Therapeutic Home Exercise Program as well as comparing current status to condition at start of symptoms. This included exercises focusing on stretching, strengthening, with focus on eccentric aspects. Long term goals include an improvement in range of motion, strength, endurance as well as avoiding reinjury. Patient's frequency would include in 1-2 times a day, 3-5 times a week for a duration of 6-12 weeks. Proper technique shown and discussed handout in great detail. All questions were discussed and addressed.     PATIENT EDUCATION:  Education details: Discussed eval findings, rehab rationale and POC and patient is in agreement  Person educated: Patient Education method: Explanation and Handouts Education comprehension: verbalized understanding and needs further education  HOME EXERCISE PROGRAM: Access Code: [email or web address] URL: [email or web address] Date: 02/03/2024 Prepared by: Reyes Kohut  Exercises - Long Sitting Plantar Fascia Stretch with Towel  - 2-3 x daily - 5 x weekly - 1 sets - 2 reps - 30s hold - Ankle Inversion Eversion Towel Slide  - 2-3 x daily - 5 x weekly - 2 sets - 10 reps - Towel Scrunches  - 2-3 x daily - 5 x weekly - 2 sets - 15 reps Red TB circles CW/CCW Red tb PF    ASSESSMENT:  CLINICAL IMPRESSION: Patient presented to PT reporting some soreness in the right foot, he also stated that he continues to have pain while standing at work for long periods of time. Today's session focused on increasing ankle mobility and strengthening for the arch and tibialis posterior. Patient will benefit from continued PT in order to increase functional mobility.     EVAL: Patient is a 58 y.o. male who  was seen today for physical therapy evaluation and treatment for R foot/ankle pain.  Patient presents with pes planus and talar tilt dysfunction.  ROM and strength deficits identified.  TTP along posterior tibialis tendon.  Patient unable to supinate in SLS or unilateral heel raise.  Patient is a good rehab candidate and may benefit from arch supports and more supportive footwear.   OBJECTIVE IMPAIRMENTS: Abnormal gait, decreased activity tolerance, decreased knowledge of condition, decreased mobility, difficulty walking, decreased ROM, decreased strength, hypomobility, postural dysfunction, and pain.   ACTIVITY LIMITATIONS: standing and stairs  PERSONAL FACTORS: Age, Fitness, Past/current experiences, and Time since onset of injury/illness/exacerbation are also affecting patient's functional outcome.   REHAB POTENTIAL: Good  CLINICAL DECISION MAKING: Stable/uncomplicated  EVALUATION COMPLEXITY: Moderate   GOALS: Goals reviewed with patient? No  SHORT TERM GOALS: Target date: 02/24/2024   Patient to demonstrate independence in HEP  Baseline: [id number] Goal status: INITIAL  2.  Patient to bring in footwear for assessment of support and cushioning Baseline: TBD Goal status: INITIAL   LONG TERM GOALS: Target date: 03/30/2024    Patient will acknowledge 4/10 pain at least once during episode of care   Baseline: 10/10 worst pain Goal status: INITIAL  2.  Patient will increase 30s chair stand reps from 4 to 10 without arms to demonstrate and improved functional ability with less pain/difficulty  as well as reduce fall risk.  Baseline: 4 Goal status: INITIAL  3.  Patient will score at least 60/80 on LEFS to signify clinically meaningful improvement in functional abilities.   Baseline: 19/56 Goal status: INITIAL  4.  Increase AROM R DF to 15d Baseline: 8d Goal status: INITIAL  5.  Increase R PF strength to 4/5 Baseline: 4-/5 Goal status: INITIAL     PLAN:  PT FREQUENCY:  1-2x/week  PT DURATION: 6 weeks  PLANNED INTERVENTIONS: 97110-Therapeutic exercises, 97530- Therapeutic activity, [id number]- Neuromuscular re-education, 97535- Self Care, [id number]- Manual therapy, [phone]- Gait training, Patient/Family education, Balance training, and Stair training  PLAN FOR NEXT SESSION: HEP assessment and progression, symptom modulation, and loading (isolated and/or functional). Manual therapy, aerobic, gait, and NME training as needed. R ankle strength and control, BL hip and BL LE strengthening.     Shanda Code, SPTA 02/13/2024, 11:37 AM

## 2024-02-14 ENCOUNTER — Ambulatory Visit

## 2024-02-14 DIAGNOSIS — M25571 Pain in right ankle and joints of right foot: Secondary | ICD-10-CM | POA: Diagnosis not present

## 2024-02-14 DIAGNOSIS — M6281 Muscle weakness (generalized): Secondary | ICD-10-CM

## 2024-02-14 NOTE — Therapy (Signed)
 OUTPATIENT PHYSICAL THERAPY TREATMENT NOTE   Patient Name: Marcus Wilkins MRN: [id number] DOB:[birth year], 58 y.o., male Today's Date: 02/14/2024  END OF SESSION:  PT End of Session - 02/14/24 0916     Visit Number 4    Number of Visits 12    Date for Recertification  04/04/24    Authorization Type [employer]    PT Start Time [phone]    PT Stop Time 0955    PT Time Calculation (min) 39 min    Activity Tolerance Patient tolerated treatment well    Behavior During Therapy [facility] for tasks assessed/performed          Past Medical History:  Diagnosis Date   Abdominal pain    right side radiating to left side   Allergy     DM (diabetes mellitus) (HCC)    ELECTROCARDIOGRAM, ABNORMAL 12/28/2008   Qualifier: Diagnosis of  By: Delford, MD, CODY Maude Dunnings    GERD (gastroesophageal reflux disease)    hx ulcer   H. pylori infection    Osteoarthritis    Plantar fasciitis    Post-operative nausea and vomiting    1 time   Sinusitis, chronic 12/22/2014   -hx sinus surgery, sees ENT    Past Surgical History:  Procedure Laterality Date   CHOLECYSTECTOMY N/A 06/16/2022   Procedure: LAPAROSCOPIC CHOLECYSTECTOMY;  Surgeon: Tanda Locus, MD;  Location: [facility] OR;  Service: General;  Laterality: N/A;   KNEE ARTHROSCOPY W/ MENISCAL REPAIR     bilateral   WISDOM TOOTH EXTRACTION     Patient Active Problem List   Diagnosis Date Noted   Right foot pain 10/23/2023   OSA on CPAP, mild 08/08/2023   Multinodular goiter 04/25/2023   Trapezius strain, right, initial encounter 04/25/2023   Acquired trigger finger 09/27/2021   Pain in finger 09/27/2021   Allergic urticaria 11/24/2020   Seasonal and perennial allergic rhinitis 11/24/2020   Hyperlipidemia associated with type 2 diabetes mellitus (HCC) 09/14/2016   Hypertension associated with diabetes (HCC) 09/14/2016   Diabetes mellitus without complication (HCC) 06/18/2016   Allergic rhinitis due to pollen 01/24/2015   Plantar fasciitis 07/28/2013    Osteoarthritis of both knees 07/28/2013   GERD 12/28/2008    PCP: Geofm Glade PARAS, MD   REFERRING PROVIDER: Silva Juliene SAUNDERS, DPM  REFERRING DIAG: [phone] (ICD-10-CM) - Posterior tibial tendon dysfunction (PTTD) of right lower extremity  THERAPY DIAG:  Acute right ankle pain  Muscle weakness (generalized)  Rationale for Evaluation and Treatment: Rehabilitation  ONSET DATE: 3-4 months  SUBJECTIVE:   SUBJECTIVE STATEMENT: Patient reports having some soreness, along with some pain especially after working.       Foot Pain      [name] Patient is here to f/u on PTTD. Pt states using the support brace with little relief.      58 y.o. male presents with the above complaint. History confirmed with patient.  He has been using the Tri-Lock brace not quite as intense of the pain as it was able to work now. Pain ongoing 3-4 months, insidious onset, localized to R arch and dorsum of foot, no better since onset, bracing ineffective. PERTINENT HISTORY: Has had some improvement continue utilize the Tri-Lock ankle brace and anti-inflammatories as needed.  I recommended formal physical therapy for strengthening balance and stability training referral was sent for this.  Return in 6 weeks to reevaluate if no improvement or worsens before then would recommend MRI and surgical considerations.   Return in about 6 weeks (  around 02/24/2024) for f/u PTTD.    PAIN:  8C Are you having pain? Yes: NPRS scale: 10/10 Pain location: R ankle/foot Pain description: sharp, ache Aggravating factors: initial step, stretch Relieving factors: rest  PRECAUTIONS: None  RED FLAGS: None   WEIGHT BEARING RESTRICTIONS: No  FALLS:  Has patient fallen in last 6 months? No   OCCUPATION: paramedic  PLOF: Independent  PATIENT GOALS: To manage my ankle pain  NEXT MD VISIT: 02/25/24  OBJECTIVE:  Note: Objective measures were completed at Evaluation unless otherwise noted.  DIAGNOSTIC FINDINGS:  none  PATIENT SURVEYS:  LEFS 19/56 (did not fill out 3rd page)     MUSCLE LENGTH: Not tested  POSTURE: pes planus, talar tilt  PALPATION: Mild TTP posterior tibialis    LOWER EXTREMITY ROM:  Passive ROM Right eval Left eval  Hip flexion    Hip extension    Hip abduction    Hip adduction    Hip internal rotation    Hip external rotation    Knee flexion    Knee extension    Ankle dorsiflexion 8/10d   Ankle plantarflexion WFL   Ankle inversion    Ankle eversion     (Blank rows = not tested)  LOWER EXTREMITY MMT:  MMT Right eval Left eval  Hip flexion    Hip extension    Hip abduction    Hip adduction    Hip internal rotation    Hip external rotation    Knee flexion    Knee extension    Ankle dorsiflexion    Ankle plantarflexion 4-   Ankle inversion    Ankle eversion     (Blank rows = not tested)  LOWER EXTREMITY SPECIAL TESTS:  Ankle special tests: Talar tilt test: positive , Tinel's test-Posterior tibialis: negative, and positive pes planus  FUNCTIONAL TESTS:  30 seconds chair stand test 4  GAIT: Distance walked: 61ftx2 Assistive device utilized: None Level of assistance: Complete Independence Comments: slower cadence, slightly antalgic                                                                                                                                TREATMENT: [facility] Adult PT Treatment:                                                DATE: 02/14/24 Therapeutic Exercise: Nustep L4 8 min Manual Therapy: R subtalar and talocrural mobs to promote DF and supination Neuromuscular re-ed: Soleus heel raise 15# 50x R gastroc stretch in supinated position 60s x2 PF stretch 60s R B heel raise in supinated position 15x2   [facility] Adult PT Treatment:  DATE: 02/13/24 Therapeutic Exercise: Seated toe scrunches 2x1' Seated toe yoga x2' Neuromuscular re-ed: Seated arch lifting 2x15 Seated rocker  board PF/DF/inv/ev x25 Seated heel raises with ball btw heels 2x15 Seated heel raises on 4 step 2x10 Seated DF toe raises 2x10 STS with RLE back 2x10 (from low table)   Treatment 02/06/24 Therapeutic Exercise: Hep reassessment and update Toe scrunches 2x8x3s Toe extension 2x8x3s Red TB clamshell x12x3s   Neuromuscular Re-Education Functional Reassessment  Red TB circles x8 CW/CCW Red TB inversion x8x3s Red TB eversion x8x3s Red TB PF x8x3s   [facility] Adult PT Treatment:                                                DATE: 02/03/24 Eval and HEP Self Care: Additional minutes spent for educating on updated Therapeutic Home Exercise Program as well as comparing current status to condition at start of symptoms. This included exercises focusing on stretching, strengthening, with focus on eccentric aspects. Long term goals include an improvement in range of motion, strength, endurance as well as avoiding reinjury. Patient's frequency would include in 1-2 times a day, 3-5 times a week for a duration of 6-12 weeks. Proper technique shown and discussed handout in great detail. All questions were discussed and addressed.     PATIENT EDUCATION:  Education details: Discussed eval findings, rehab rationale and POC and patient is in agreement  Person educated: Patient Education method: Explanation and Handouts Education comprehension: verbalized understanding and needs further education  HOME EXERCISE PROGRAM: Access Code: [email or web address] URL: [email or web address] Date: 02/03/2024 Prepared by: Reyes Kohut  Exercises - Long Sitting Plantar Fascia Stretch with Towel  - 2-3 x daily - 5 x weekly - 1 sets - 2 reps - 30s hold - Ankle Inversion Eversion Towel Slide  - 2-3 x daily - 5 x weekly - 2 sets - 10 reps - Towel Scrunches  - 2-3 x daily - 5 x weekly - 2 sets - 15 reps Red TB circles CW/CCW Red tb PF    ASSESSMENT:  CLINICAL IMPRESSION:  Symptoms localized to R midfoot  region in area of arthritic changes.  Focus of session was promoting DF and gastroc/soleus flexibility.  Modified stretches and strengthening tasks to facilitate appropriate foot mechanics.     EVAL: Patient is a 58 y.o. male who was seen today for physical therapy evaluation and treatment for R foot/ankle pain.  Patient presents with pes planus and talar tilt dysfunction.  ROM and strength deficits identified.  TTP along posterior tibialis tendon.  Patient unable to supinate in SLS or unilateral heel raise.  Patient is a good rehab candidate and may benefit from arch supports and more supportive footwear.   OBJECTIVE IMPAIRMENTS: Abnormal gait, decreased activity tolerance, decreased knowledge of condition, decreased mobility, difficulty walking, decreased ROM, decreased strength, hypomobility, postural dysfunction, and pain.   ACTIVITY LIMITATIONS: standing and stairs  PERSONAL FACTORS: Age, Fitness, Past/current experiences, and Time since onset of injury/illness/exacerbation are also affecting patient's functional outcome.   REHAB POTENTIAL: Good  CLINICAL DECISION MAKING: Stable/uncomplicated  EVALUATION COMPLEXITY: Moderate   GOALS: Goals reviewed with patient? No  SHORT TERM GOALS: Target date: 02/24/2024   Patient to demonstrate independence in HEP  Baseline: [id number] Goal status: INITIAL  2.  Patient to bring in footwear for assessment of support and cushioning Baseline: TBD Goal status: INITIAL  LONG TERM GOALS: Target date: 03/30/2024    Patient will acknowledge 4/10 pain at least once during episode of care   Baseline: 10/10 worst pain Goal status: INITIAL  2.  Patient will increase 30s chair stand reps from 4 to 10 without arms to demonstrate and improved functional ability with less pain/difficulty as well as reduce fall risk.  Baseline: 4 Goal status: INITIAL  3.  Patient will score at least 60/80 on LEFS to signify clinically meaningful improvement in  functional abilities.   Baseline: 19/56 Goal status: INITIAL  4.  Increase AROM R DF to 15d Baseline: 8d Goal status: INITIAL  5.  Increase R PF strength to 4/5 Baseline: 4-/5 Goal status: INITIAL     PLAN:  PT FREQUENCY: 1-2x/week  PT DURATION: 6 weeks  PLANNED INTERVENTIONS: 97110-Therapeutic exercises, 97530- Therapeutic activity, [id number]- Neuromuscular re-education, 97535- Self Care, [id number]- Manual therapy, [phone]- Gait training, Patient/Family education, Balance training, and Stair training  PLAN FOR NEXT SESSION: HEP assessment and progression, symptom modulation, and loading (isolated and/or functional). Manual therapy, aerobic, gait, and NME training as needed. R ankle strength and control, BL hip and BL LE strengthening.     Sorren Vallier M Marialy Urbanczyk, PT 02/14/2024, 10:14 AM

## 2024-02-18 ENCOUNTER — Ambulatory Visit

## 2024-02-18 DIAGNOSIS — M6281 Muscle weakness (generalized): Secondary | ICD-10-CM

## 2024-02-18 DIAGNOSIS — M25571 Pain in right ankle and joints of right foot: Secondary | ICD-10-CM

## 2024-02-18 NOTE — Therapy (Signed)
 OUTPATIENT PHYSICAL THERAPY TREATMENT NOTE   Patient Name: Marcus Wilkins MRN: [id number] DOB:January 02, 1966, 58 y.o., male Today's Date: 02/18/2024  END OF SESSION:  PT End of Session - 02/18/24 1102     Visit Number 5    Number of Visits 12    Date for Recertification  04/04/24    Authorization Type UHC    PT Start Time 1050    PT Stop Time 1124    PT Time Calculation (min) 34 min    Activity Tolerance Patient tolerated treatment well    Behavior During Therapy WFL for tasks assessed/performed           Past Medical History:  Diagnosis Date   Abdominal pain    right side radiating to left side   Allergy     DM (diabetes mellitus) (HCC)    ELECTROCARDIOGRAM, ABNORMAL 12/28/2008   Qualifier: Diagnosis of  By: Delford, MD, CODY Maude Dunnings    GERD (gastroesophageal reflux disease)    hx ulcer   H. pylori infection    Osteoarthritis    Plantar fasciitis    Post-operative nausea and vomiting    1 time   Sinusitis, chronic 12/22/2014   -hx sinus surgery, sees ENT    Past Surgical History:  Procedure Laterality Date   CHOLECYSTECTOMY N/A 06/16/2022   Procedure: LAPAROSCOPIC CHOLECYSTECTOMY;  Surgeon: Tanda Locus, MD;  Location: [facility] OR;  Service: General;  Laterality: N/A;   KNEE ARTHROSCOPY W/ MENISCAL REPAIR     bilateral   WISDOM TOOTH EXTRACTION     Patient Active Problem List   Diagnosis Date Noted   Right foot pain 10/23/2023   OSA on CPAP, mild 08/08/2023   Multinodular goiter 04/25/2023   Trapezius strain, right, initial encounter 04/25/2023   Acquired trigger finger 09/27/2021   Pain in finger 09/27/2021   Allergic urticaria 11/24/2020   Seasonal and perennial allergic rhinitis 11/24/2020   Hyperlipidemia associated with type 2 diabetes mellitus (HCC) 09/14/2016   Hypertension associated with diabetes (HCC) 09/14/2016   Diabetes mellitus without complication (HCC) 06/18/2016   Allergic rhinitis due to pollen 01/24/2015   Plantar fasciitis 07/28/2013    Osteoarthritis of both knees 07/28/2013   GERD 12/28/2008    PCP: Geofm Glade PARAS, MD   REFERRING PROVIDER: Silva Juliene SAUNDERS, DPM  REFERRING DIAG: [id number] (ICD-10-CM) - Posterior tibial tendon dysfunction (PTTD) of right lower extremity  THERAPY DIAG:  Acute right ankle pain  Muscle weakness (generalized)  Rationale for Evaluation and Treatment: Rehabilitation  ONSET DATE: 3-4 months  SUBJECTIVE:   SUBJECTIVE STATEMENT: Patient reporting that he is feeling better, having minimal pain at start of session today.     Foot Pain      [name] Patient is here to f/u on PTTD. Pt states using the support brace with little relief.      58 y.o. male presents with the above complaint. History confirmed with patient.  He has been using the Tri-Lock brace not quite as intense of the pain as it was able to work now. Pain ongoing 3-4 months, insidious onset, localized to R arch and dorsum of foot, no better since onset, bracing ineffective. PERTINENT HISTORY: Has had some improvement continue utilize the Tri-Lock ankle brace and anti-inflammatories as needed.  I recommended formal physical therapy for strengthening balance and stability training referral was sent for this.  Return in 6 weeks to reevaluate if no improvement or worsens before then would recommend MRI and surgical considerations.   Return in about  6 weeks (around 02/24/2024) for f/u PTTD.    PAIN:   Are you having pain? Yes: NPRS scale: 10/10 Pain location: R ankle/foot Pain description: sharp, ache Aggravating factors: initial step, stretch Relieving factors: rest  PRECAUTIONS: None  RED FLAGS: None   WEIGHT BEARING RESTRICTIONS: No  FALLS:  Has patient fallen in last 6 months? No   OCCUPATION: paramedic  PLOF: Independent  PATIENT GOALS: To manage my ankle pain  NEXT MD VISIT: 02/25/24  OBJECTIVE:  Note: Objective measures were completed at Evaluation unless otherwise noted.  DIAGNOSTIC FINDINGS:  none  PATIENT SURVEYS:  LEFS 19/56 (did not fill out 3rd page)  MUSCLE LENGTH: Not tested  POSTURE: pes planus, talar tilt  PALPATION: Mild TTP posterior tibialis    LOWER EXTREMITY ROM:  Passive ROM Right eval Left eval  Hip flexion    Hip extension    Hip abduction    Hip adduction    Hip internal rotation    Hip external rotation    Knee flexion    Knee extension    Ankle dorsiflexion 8/10d   Ankle plantarflexion WFL   Ankle inversion    Ankle eversion     (Blank rows = not tested)  LOWER EXTREMITY MMT:  MMT Right eval Left eval  Hip flexion    Hip extension    Hip abduction    Hip adduction    Hip internal rotation    Hip external rotation    Knee flexion    Knee extension    Ankle dorsiflexion    Ankle plantarflexion 4-   Ankle inversion    Ankle eversion     (Blank rows = not tested)  LOWER EXTREMITY SPECIAL TESTS:  Ankle special tests: Talar tilt test: positive , Tinel's test-Posterior tibialis: negative, and positive pes planus  FUNCTIONAL TESTS:  30 seconds chair stand test 4  GAIT: Distance walked: 69ftx2 Assistive device utilized: None Level of assistance: Complete Independence Comments: slower cadence, slightly antalgic                                                                                                                                TREATMENT:[facility] Adult PT Treatment:                                                DATE: 02/18/24  Therapeutic Exercise: Nustep L4 8 min  Manual Therapy: R subtalar and talocrural mobs to promote DF and supination  Neuromuscular re-ed: Soleus heel raise 15# x50 R gastroc stretch in supinated position 30s x 3  PF stretch 60s R Seated arch lifting 2 x 15  B heel raise in supinated position 15x2    [facility] Adult PT Treatment:  DATE: 02/14/24 Therapeutic Exercise: Nustep L4 8 min Manual Therapy: R subtalar and talocrural mobs to promote DF  and supination Neuromuscular re-ed: Soleus heel raise 15# 50x R gastroc stretch in supinated position 60s x2 PF stretch 60s R B heel raise in supinated position 15x2   [facility] Adult PT Treatment:                                                DATE: 02/13/24 Therapeutic Exercise: Seated toe scrunches 2x1' Seated toe yoga x2' Neuromuscular re-ed: Seated arch lifting 2x15 Seated rocker board PF/DF/inv/ev x25 Seated heel raises with ball btw heels 2x15 Seated heel raises on 4 step 2x10 Seated DF toe raises 2x10 STS with RLE back 2x10 (from low table)   Treatment 02/06/24 Therapeutic Exercise: Hep reassessment and update Toe scrunches 2x8x3s Toe extension 2x8x3s Red TB clamshell x12x3s   Neuromuscular Re-Education Functional Reassessment  Red TB circles x8 CW/CCW Red TB inversion x8x3s Red TB eversion x8x3s Red TB PF x8x3s   [facility] Adult PT Treatment:                                                DATE: 02/03/24 Eval and HEP Self Care: Additional minutes spent for educating on updated Therapeutic Home Exercise Program as well as comparing current status to condition at start of symptoms. This included exercises focusing on stretching, strengthening, with focus on eccentric aspects. Long term goals include an improvement in range of motion, strength, endurance as well as avoiding reinjury. Patient's frequency would include in 1-2 times a day, 3-5 times a week for a duration of 6-12 weeks. Proper technique shown and discussed handout in great detail. All questions were discussed and addressed.     PATIENT EDUCATION:  Education details: Discussed eval findings, rehab rationale and POC and patient is in agreement  Person educated: Patient Education method: Explanation and Handouts Education comprehension: verbalized understanding and needs further education  HOME EXERCISE PROGRAM: Access Code: [email or web address] URL: [email or web address] Date: 02/03/2024 Prepared by:  Reyes Kohut  Exercises - Long Sitting Plantar Fascia Stretch with Towel  - 2-3 x daily - 5 x weekly - 1 sets - 2 reps - 30s hold - Ankle Inversion Eversion Towel Slide  - 2-3 x daily - 5 x weekly - 2 sets - 10 reps - Towel Scrunches  - 2-3 x daily - 5 x weekly - 2 sets - 15 reps Red TB circles CW/CCW Red tb PF    ASSESSMENT:  CLINICAL IMPRESSION:    Patient had good tolerance of today session, with decreased overall symptoms. We will continue to progress POC with focus on promoting DF and gastroc/soleus flexibility.     EVAL: Patient is a 58 y.o. male who was seen today for physical therapy evaluation and treatment for R foot/ankle pain.  Patient presents with pes planus and talar tilt dysfunction.  ROM and strength deficits identified.  TTP along posterior tibialis tendon.  Patient unable to supinate in SLS or unilateral heel raise.  Patient is a good rehab candidate and may benefit from arch supports and more supportive footwear.   OBJECTIVE IMPAIRMENTS: Abnormal gait, decreased activity tolerance, decreased knowledge of condition, decreased mobility, difficulty walking, decreased ROM, decreased strength,  hypomobility, postural dysfunction, and pain.   ACTIVITY LIMITATIONS: standing and stairs  PERSONAL FACTORS: Age, Fitness, Past/current experiences, and Time since onset of injury/illness/exacerbation are also affecting patient's functional outcome.   REHAB POTENTIAL: Good  CLINICAL DECISION MAKING: Stable/uncomplicated  EVALUATION COMPLEXITY: Moderate   GOALS: Goals reviewed with patient? No  SHORT TERM GOALS: Target date: 02/24/2024   Patient to demonstrate independence in HEP  Baseline: [id number] Goal status: INITIAL  2.  Patient to bring in footwear for assessment of support and cushioning Baseline: TBD Goal status: INITIAL   LONG TERM GOALS: Target date: 03/30/2024    Patient will acknowledge 4/10 pain at least once during episode of care   Baseline: 10/10  worst pain Goal status: INITIAL  2.  Patient will increase 30s chair stand reps from 4 to 10 without arms to demonstrate and improved functional ability with less pain/difficulty as well as reduce fall risk.  Baseline: 4 Goal status: INITIAL  3.  Patient will score at least 60/80 on LEFS to signify clinically meaningful improvement in functional abilities.   Baseline: 19/56 Goal status: INITIAL  4.  Increase AROM R DF to 15d Baseline: 8d Goal status: INITIAL  5.  Increase R PF strength to 4/5 Baseline: 4-/5 Goal status: INITIAL     PLAN:  PT FREQUENCY: 1-2x/week  PT DURATION: 6 weeks  PLANNED INTERVENTIONS: 97110-Therapeutic exercises, 97530- Therapeutic activity, [id number]- Neuromuscular re-education, 97535- Self Care, [id number]- Manual therapy, [phone]- Gait training, Patient/Family education, Balance training, and Stair training  PLAN FOR NEXT SESSION: HEP assessment and progression, symptom modulation, and loading (isolated and/or functional). Manual therapy, aerobic, gait, and NME training as needed. R ankle strength and control, BL hip and BL LE strengthening.     Marko Molt, PT 02/18/2024, 11:28 AM

## 2024-02-19 ENCOUNTER — Ambulatory Visit

## 2024-02-19 DIAGNOSIS — M25571 Pain in right ankle and joints of right foot: Secondary | ICD-10-CM | POA: Diagnosis not present

## 2024-02-19 DIAGNOSIS — M6281 Muscle weakness (generalized): Secondary | ICD-10-CM

## 2024-02-19 NOTE — Therapy (Signed)
 OUTPATIENT PHYSICAL THERAPY TREATMENT NOTE   Patient Name: Marcus Wilkins MRN: [id number] DOB:1966-02-15, 58 y.o., male Today's Date: 02/19/2024  END OF SESSION:  PT End of Session - 02/19/24 0928     Visit Number 6    Number of Visits 12    Date for Recertification  04/04/24    Authorization Type UHC    PT Start Time [phone]   patient arrived late   PT Stop Time 0956    PT Time Calculation (min) 28 min    Activity Tolerance Patient tolerated treatment well    Behavior During Therapy [facility] for tasks assessed/performed          Past Medical History:  Diagnosis Date   Abdominal pain    right side radiating to left side   Allergy     DM (diabetes mellitus) (HCC)    ELECTROCARDIOGRAM, ABNORMAL 12/28/2008   Qualifier: Diagnosis of  By: Marcus Wilkins, Marcus Wilkins    GERD (gastroesophageal reflux disease)    hx ulcer   H. pylori infection    Osteoarthritis    Plantar fasciitis    Post-operative nausea and vomiting    1 time   Sinusitis, chronic 12/22/2014   -hx sinus surgery, sees ENT    Past Surgical History:  Procedure Laterality Date   CHOLECYSTECTOMY N/A 06/16/2022   Procedure: LAPAROSCOPIC CHOLECYSTECTOMY;  Surgeon: Marcus Wilkins;  Location: [facility] OR;  Service: General;  Laterality: N/A;   KNEE ARTHROSCOPY W/ MENISCAL REPAIR     bilateral   WISDOM TOOTH EXTRACTION     Patient Active Problem List   Diagnosis Date Noted   Right foot pain 10/23/2023   OSA on CPAP, mild 08/08/2023   Multinodular goiter 04/25/2023   Trapezius strain, right, initial encounter 04/25/2023   Acquired trigger finger 09/27/2021   Pain in finger 09/27/2021   Allergic urticaria 11/24/2020   Seasonal and perennial allergic rhinitis 11/24/2020   Hyperlipidemia associated with type 2 diabetes mellitus (HCC) 09/14/2016   Hypertension associated with diabetes (HCC) 09/14/2016   Diabetes mellitus without complication (HCC) 06/18/2016   Allergic rhinitis due to pollen 01/24/2015   Plantar  fasciitis 07/28/2013   Osteoarthritis of both knees 07/28/2013   GERD 12/28/2008    PCP: Marcus Wilkins   REFERRING PROVIDER: Silva Juliene SAUNDERS, Wilkins  REFERRING DIAG: [phone] (ICD-10-CM) - Posterior tibial tendon dysfunction (PTTD) of right lower extremity  THERAPY DIAG:  Acute right ankle pain  Muscle weakness (generalized)  Rationale for Evaluation and Treatment: Rehabilitation  ONSET DATE: 3-4 months  SUBJECTIVE:   SUBJECTIVE STATEMENT: Patient reports that he feels much better today, he isn't having any pain today.     Foot Pain      [name] Patient is here to f/u on PTTD. Pt states using the support brace with little relief.      58 y.o. male presents with the above complaint. History confirmed with patient.  He has been using the Tri-Lock brace not quite as intense of the pain as it was able to work now. Pain ongoing 3-4 months, insidious onset, localized to R arch and dorsum of foot, no better since onset, bracing ineffective. PERTINENT HISTORY: Has had some improvement continue utilize the Tri-Lock ankle brace and anti-inflammatories as needed.  I recommended formal physical therapy for strengthening balance and stability training referral was sent for this.  Return in 6 weeks to reevaluate if no improvement or worsens before then would recommend MRI and surgical considerations.   Return  in about 6 weeks (around 02/24/2024) for f/u PTTD.    PAIN:   Are you having pain? Yes: NPRS scale: 10/10 Pain location: R ankle/foot Pain description: sharp, ache Aggravating factors: initial step, stretch Relieving factors: rest  PRECAUTIONS: None  RED FLAGS: None   WEIGHT BEARING RESTRICTIONS: No  FALLS:  Has patient fallen in last 6 months? No   OCCUPATION: paramedic  PLOF: Independent  PATIENT GOALS: To manage my ankle pain  NEXT Wilkins VISIT: 02/25/24  OBJECTIVE:  Note: Objective measures were completed at Evaluation unless otherwise noted.  DIAGNOSTIC  FINDINGS: none  PATIENT SURVEYS:  LEFS 19/56 (did not fill out 3rd page)  MUSCLE LENGTH: Not tested  POSTURE: pes planus, talar tilt  PALPATION: Mild TTP posterior tibialis    LOWER EXTREMITY ROM:  Passive ROM Right eval Left eval  Hip flexion    Hip extension    Hip abduction    Hip adduction    Hip internal rotation    Hip external rotation    Knee flexion    Knee extension    Ankle dorsiflexion 8/10d   Ankle plantarflexion WFL   Ankle inversion    Ankle eversion     (Blank rows = not tested)  LOWER EXTREMITY MMT:  MMT Right eval Left eval  Hip flexion    Hip extension    Hip abduction    Hip adduction    Hip internal rotation    Hip external rotation    Knee flexion    Knee extension    Ankle dorsiflexion    Ankle plantarflexion 4-   Ankle inversion    Ankle eversion     (Blank rows = not tested)  LOWER EXTREMITY SPECIAL TESTS:  Ankle special tests: Talar tilt test: positive , Tinel's test-Posterior tibialis: negative, and positive pes planus  FUNCTIONAL TESTS:  30 seconds chair stand test 4  GAIT: Distance walked: 32ftx2 Assistive device utilized: None Level of assistance: Complete Independence Comments: slower cadence, slightly antalgic                                                                                                                                TREATMENT: [facility] Adult PT Treatment:                                                DATE: 02/19/24 Therapeutic Exercise: Gastroc stretch with slant board 2x30 Neuromuscular re-ed: Standing heel raises 3x8 Seated arch raises 2x15 Seated rocker board PF/DF/inv/ev x25 Seated DF toe raises 2x15 Soleus heel raise 15# x50  [facility] Adult PT Treatment:  DATE: 02/18/24  Therapeutic Exercise: Nustep L4 8 min  Manual Therapy: R subtalar and talocrural mobs to promote DF and supination  Neuromuscular re-ed: Soleus heel raise 15# x50 R  gastroc stretch in supinated position 30s x 3  PF stretch 60s R Seated arch lifting 2 x 15  B heel raise in supinated position 15x2    [facility] Adult PT Treatment:                                                DATE: 02/14/24 Therapeutic Exercise: Nustep L4 8 min Manual Therapy: R subtalar and talocrural mobs to promote DF and supination Neuromuscular re-ed: Soleus heel raise 15# 50x R gastroc stretch in supinated position 60s x2 PF stretch 60s R B heel raise in supinated position 15x2   [facility] Adult PT Treatment:                                                DATE: 02/13/24 Therapeutic Exercise: Seated toe scrunches 2x1' Seated toe yoga x2' Neuromuscular re-ed: Seated arch lifting 2x15 Seated rocker board PF/DF/inv/ev x25 Seated heel raises with ball btw heels 2x15 Seated heel raises on 4 step 2x10 Seated DF toe raises 2x10 STS with RLE back 2x10 (from low table)   Treatment 02/06/24 Therapeutic Exercise: Hep reassessment and update Toe scrunches 2x8x3s Toe extension 2x8x3s Red TB clamshell x12x3s   Neuromuscular Re-Education Functional Reassessment  Red TB circles x8 CW/CCW Red TB inversion x8x3s Red TB eversion x8x3s Red TB PF x8x3s   [facility] Adult PT Treatment:                                                DATE: 02/03/24 Eval and HEP Self Care: Additional minutes spent for educating on updated Therapeutic Home Exercise Program as well as comparing current status to condition at start of symptoms. This included exercises focusing on stretching, strengthening, with focus on eccentric aspects. Long term goals include an improvement in range of motion, strength, endurance as well as avoiding reinjury. Patient's frequency would include in 1-2 times a day, 3-5 times a week for a duration of 6-12 weeks. Proper technique shown and discussed handout in great detail. All questions were discussed and addressed.     PATIENT EDUCATION:  Education details: Discussed eval  findings, rehab rationale and POC and patient is in agreement  Person educated: Patient Education method: Explanation and Handouts Education comprehension: verbalized understanding and needs further education  HOME EXERCISE PROGRAM: Access Code: [email or web address] URL: [email or web address] Date: 02/03/2024 Prepared by: Reyes Kohut  Exercises - Long Sitting Plantar Fascia Stretch with Towel  - 2-3 x daily - 5 x weekly - 1 sets - 2 reps - 30s hold - Ankle Inversion Eversion Towel Slide  - 2-3 x daily - 5 x weekly - 2 sets - 10 reps - Towel Scrunches  - 2-3 x daily - 5 x weekly - 2 sets - 15 reps Red TB circles CW/CCW Red tb PF    ASSESSMENT:  CLINICAL IMPRESSION:   Patient presents to PT reporting that he feels much better,  that the pain isn't as frequent. Patient tolerated exercises well. Today's session focused on strengthening for ankle stability and ankle mobility. Patient will benefit from skilled PT in order to increase functional mobility and ankle flexibility.    EVAL: Patient is a 58 y.o. male who was seen today for physical therapy evaluation and treatment for R foot/ankle pain.  Patient presents with pes planus and talar tilt dysfunction.  ROM and strength deficits identified.  TTP along posterior tibialis tendon.  Patient unable to supinate in SLS or unilateral heel raise.  Patient is a good rehab candidate and may benefit from arch supports and more supportive footwear.   OBJECTIVE IMPAIRMENTS: Abnormal gait, decreased activity tolerance, decreased knowledge of condition, decreased mobility, difficulty walking, decreased ROM, decreased strength, hypomobility, postural dysfunction, and pain.   ACTIVITY LIMITATIONS: standing and stairs  PERSONAL FACTORS: Age, Fitness, Past/current experiences, and Time since onset of injury/illness/exacerbation are also affecting patient's functional outcome.   REHAB POTENTIAL: Good  CLINICAL DECISION MAKING:  Stable/uncomplicated  EVALUATION COMPLEXITY: Moderate   GOALS: Goals reviewed with patient? No  SHORT TERM GOALS: Target date: 02/24/2024   Patient to demonstrate independence in HEP  Baseline: [id number] Goal status: INITIAL  2.  Patient to bring in footwear for assessment of support and cushioning Baseline: TBD Goal status: INITIAL   LONG TERM GOALS: Target date: 03/30/2024    Patient will acknowledge 4/10 pain at least once during episode of care   Baseline: 10/10 worst pain Goal status: INITIAL  2.  Patient will increase 30s chair stand reps from 4 to 10 without arms to demonstrate and improved functional ability with less pain/difficulty as well as reduce fall risk.  Baseline: 4 Goal status: INITIAL  3.  Patient will score at least 60/80 on LEFS to signify clinically meaningful improvement in functional abilities.   Baseline: 19/56 Goal status: INITIAL  4.  Increase AROM R DF to 15d Baseline: 8d Goal status: INITIAL  5.  Increase R PF strength to 4/5 Baseline: 4-/5 Goal status: INITIAL     PLAN:  PT FREQUENCY: 1-2x/week  PT DURATION: 6 weeks  PLANNED INTERVENTIONS: 97110-Therapeutic exercises, 97530- Therapeutic activity, [id number]- Neuromuscular re-education, 97535- Self Care, [id number]- Manual therapy, [phone]- Gait training, Patient/Family education, Balance training, and Stair training  PLAN FOR NEXT SESSION: HEP assessment and progression, symptom modulation, and loading (isolated and/or functional). Manual therapy, aerobic, gait, and NME training as needed. R ankle strength and control, BL hip and BL LE strengthening.     Shanda Code, SPTA 02/19/2024, 9:57 AM

## 2024-02-24 ENCOUNTER — Ambulatory Visit: Admitting: Podiatry

## 2024-02-24 ENCOUNTER — Encounter: Payer: Self-pay | Admitting: Podiatry

## 2024-02-24 DIAGNOSIS — M66871 Spontaneous rupture of other tendons, right ankle and foot: Secondary | ICD-10-CM | POA: Diagnosis not present

## 2024-02-24 NOTE — Therapy (Incomplete)
 OUTPATIENT PHYSICAL THERAPY TREATMENT NOTE   Patient Name: Marcus Wilkins MRN: [id number] DOB:[birth year], 58 y.o., male Today's Date: 02/24/2024  END OF SESSION:    Past Medical History:  Diagnosis Date   Abdominal pain    right side radiating to left side   Allergy     DM (diabetes mellitus) (HCC)    ELECTROCARDIOGRAM, ABNORMAL 12/28/2008   Qualifier: Diagnosis of  By: Delford, MD, CODY Maude Dunnings    GERD (gastroesophageal reflux disease)    hx ulcer   H. pylori infection    Osteoarthritis    Plantar fasciitis    Post-operative nausea and vomiting    1 time   Sinusitis, chronic 12/22/2014   -hx sinus surgery, sees ENT    Past Surgical History:  Procedure Laterality Date   CHOLECYSTECTOMY N/A 06/16/2022   Procedure: LAPAROSCOPIC CHOLECYSTECTOMY;  Surgeon: Tanda Locus, MD;  Location: [facility] OR;  Service: General;  Laterality: N/A;   KNEE ARTHROSCOPY W/ MENISCAL REPAIR     bilateral   WISDOM TOOTH EXTRACTION     Patient Active Problem List   Diagnosis Date Noted   Right foot pain 10/23/2023   OSA on CPAP, mild 08/08/2023   Multinodular goiter 04/25/2023   Trapezius strain, right, initial encounter 04/25/2023   Acquired trigger finger 09/27/2021   Pain in finger 09/27/2021   Allergic urticaria 11/24/2020   Seasonal and perennial allergic rhinitis 11/24/2020   Hyperlipidemia associated with type 2 diabetes mellitus (HCC) 09/14/2016   Hypertension associated with diabetes (HCC) 09/14/2016   Diabetes mellitus without complication (HCC) 06/18/2016   Allergic rhinitis due to pollen 01/24/2015   Plantar fasciitis 07/28/2013   Osteoarthritis of both knees 07/28/2013   GERD 12/28/2008    PCP: Geofm Glade PARAS, MD   REFERRING PROVIDER: Silva Juliene SAUNDERS, DPM  REFERRING DIAG: [phone] (ICD-10-CM) - Posterior tibial tendon dysfunction (PTTD) of right lower extremity  THERAPY DIAG:  No diagnosis found.  Rationale for Evaluation and Treatment: Rehabilitation  ONSET DATE:  3-4 months  SUBJECTIVE:   SUBJECTIVE STATEMENT: ***  Patient reports that he feels much better today, he isn't having any pain today.     Foot Pain      [name] Patient is here to f/u on PTTD. Pt states using the support brace with little relief.      58 y.o. male presents with the above complaint. History confirmed with patient.  He has been using the Tri-Lock brace not quite as intense of the pain as it was able to work now. Pain ongoing 3-4 months, insidious onset, localized to R arch and dorsum of foot, no better since onset, bracing ineffective. PERTINENT HISTORY: Has had some improvement continue utilize the Tri-Lock ankle brace and anti-inflammatories as needed.  I recommended formal physical therapy for strengthening balance and stability training referral was sent for this.  Return in 6 weeks to reevaluate if no improvement or worsens before then would recommend MRI and surgical considerations.   Return in about 6 weeks (around 02/24/2024) for f/u PTTD.    PAIN:   Are you having pain? Yes: NPRS scale: 10/10 Pain location: R ankle/foot Pain description: sharp, ache Aggravating factors: initial step, stretch Relieving factors: rest  PRECAUTIONS: None  RED FLAGS: None   WEIGHT BEARING RESTRICTIONS: No  FALLS:  Has patient fallen in last 6 months? No   OCCUPATION: paramedic  PLOF: Independent  PATIENT GOALS: To manage my ankle pain  NEXT MD VISIT: 02/25/24  OBJECTIVE:  Note: Objective measures were  completed at Evaluation unless otherwise noted.  DIAGNOSTIC FINDINGS: none  PATIENT SURVEYS:  LEFS 19/56 (did not fill out 3rd page)  MUSCLE LENGTH: Not tested  POSTURE: pes planus, talar tilt  PALPATION: Mild TTP posterior tibialis    LOWER EXTREMITY ROM:  Passive ROM Right eval Left eval  Hip flexion    Hip extension    Hip abduction    Hip adduction    Hip internal rotation    Hip external rotation    Knee flexion    Knee extension     Ankle dorsiflexion 8/10d   Ankle plantarflexion WFL   Ankle inversion    Ankle eversion     (Blank rows = not tested)  LOWER EXTREMITY MMT:  MMT Right eval Left eval  Hip flexion    Hip extension    Hip abduction    Hip adduction    Hip internal rotation    Hip external rotation    Knee flexion    Knee extension    Ankle dorsiflexion    Ankle plantarflexion 4-   Ankle inversion    Ankle eversion     (Blank rows = not tested)  LOWER EXTREMITY SPECIAL TESTS:  Ankle special tests: Talar tilt test: positive , Tinel's test-Posterior tibialis: negative, and positive pes planus  FUNCTIONAL TESTS:  30 seconds chair stand test 4  GAIT: Distance walked: 24ftx2 Assistive device utilized: None Level of assistance: Complete Independence Comments: slower cadence, slightly antalgic                                                                                                                                TREATMENT: [facility] Adult PT Treatment:                                                DATE: 02/25/24 Therapeutic Exercise: Nustep level 4 x 8 mins Gastroc stretch with slant board 2x30 Neuromuscular re-ed: Seated DF toe raises 2x15 Soleus heel raise 15# x50 Seated arch raises 2x15 Heel raises in supinated position SLS 2x30 RLE   [facility] Adult PT Treatment:                                                DATE: 02/19/24 Therapeutic Exercise: Gastroc stretch with slant board 2x30 Neuromuscular re-ed: Standing heel raises 3x8 Seated arch raises 2x15 Seated rocker board PF/DF/inv/ev x25 Seated DF toe raises 2x15 Soleus heel raise 15# x50  [facility] Adult PT Treatment:  DATE: 02/18/24  Therapeutic Exercise: Nustep L4 8 min  Manual Therapy: R subtalar and talocrural mobs to promote DF and supination  Neuromuscular re-ed: Soleus heel raise 15# x50 R gastroc stretch in supinated position 30s x 3  PF stretch 60s R Seated arch  lifting 2 x 15  B heel raise in supinated position 15x2    [facility] Adult PT Treatment:                                                DATE: 02/14/24 Therapeutic Exercise: Nustep L4 8 min Manual Therapy: R subtalar and talocrural mobs to promote DF and supination Neuromuscular re-ed: Soleus heel raise 15# 50x R gastroc stretch in supinated position 60s x2 PF stretch 60s R B heel raise in supinated position 15x2   [facility] Adult PT Treatment:                                                DATE: 02/13/24 Therapeutic Exercise: Seated toe scrunches 2x1' Seated toe yoga x2' Neuromuscular re-ed: Seated arch lifting 2x15 Seated rocker board PF/DF/inv/ev x25 Seated heel raises with ball btw heels 2x15 Seated heel raises on 4 step 2x10 Seated DF toe raises 2x10 STS with RLE back 2x10 (from low table)   Treatment 02/06/24 Therapeutic Exercise: Hep reassessment and update Toe scrunches 2x8x3s Toe extension 2x8x3s Red TB clamshell x12x3s   Neuromuscular Re-Education Functional Reassessment  Red TB circles x8 CW/CCW Red TB inversion x8x3s Red TB eversion x8x3s Red TB PF x8x3s   [facility] Adult PT Treatment:                                                DATE: 02/03/24 Eval and HEP Self Care: Additional minutes spent for educating on updated Therapeutic Home Exercise Program as well as comparing current status to condition at start of symptoms. This included exercises focusing on stretching, strengthening, with focus on eccentric aspects. Long term goals include an improvement in range of motion, strength, endurance as well as avoiding reinjury. Patient's frequency would include in 1-2 times a day, 3-5 times a week for a duration of 6-12 weeks. Proper technique shown and discussed handout in great detail. All questions were discussed and addressed.     PATIENT EDUCATION:  Education details: Discussed eval findings, rehab rationale and POC and patient is in agreement  Person educated:  Patient Education method: Explanation and Handouts Education comprehension: verbalized understanding and needs further education  HOME EXERCISE PROGRAM: Access Code: [email or web address] URL: [email or web address] Date: 02/03/2024 Prepared by: Reyes Kohut  Exercises - Long Sitting Plantar Fascia Stretch with Towel  - 2-3 x daily - 5 x weekly - 1 sets - 2 reps - 30s hold - Ankle Inversion Eversion Towel Slide  - 2-3 x daily - 5 x weekly - 2 sets - 10 reps - Towel Scrunches  - 2-3 x daily - 5 x weekly - 2 sets - 15 reps Red TB circles CW/CCW Red tb PF    ASSESSMENT:  CLINICAL IMPRESSION:   ***  Patient presents to PT reporting that he feels  much better, that the pain isn't as frequent. Patient tolerated exercises well. Today's session focused on strengthening for ankle stability and ankle mobility. Patient will benefit from skilled PT in order to increase functional mobility and ankle flexibility.    EVAL: Patient is a 58 y.o. male who was seen today for physical therapy evaluation and treatment for R foot/ankle pain.  Patient presents with pes planus and talar tilt dysfunction.  ROM and strength deficits identified.  TTP along posterior tibialis tendon.  Patient unable to supinate in SLS or unilateral heel raise.  Patient is a good rehab candidate and may benefit from arch supports and more supportive footwear.   OBJECTIVE IMPAIRMENTS: Abnormal gait, decreased activity tolerance, decreased knowledge of condition, decreased mobility, difficulty walking, decreased ROM, decreased strength, hypomobility, postural dysfunction, and pain.   ACTIVITY LIMITATIONS: standing and stairs  PERSONAL FACTORS: Age, Fitness, Past/current experiences, and Time since onset of injury/illness/exacerbation are also affecting patient's functional outcome.   REHAB POTENTIAL: Good  CLINICAL DECISION MAKING: Stable/uncomplicated  EVALUATION COMPLEXITY: Moderate   GOALS: Goals reviewed with  patient? No  SHORT TERM GOALS: Target date: 02/24/2024   Patient to demonstrate independence in HEP  Baseline: [id number] Goal status: INITIAL  2.  Patient to bring in footwear for assessment of support and cushioning Baseline: TBD Goal status: INITIAL   LONG TERM GOALS: Target date: 03/30/2024    Patient will acknowledge 4/10 pain at least once during episode of care   Baseline: 10/10 worst pain Goal status: INITIAL  2.  Patient will increase 30s chair stand reps from 4 to 10 without arms to demonstrate and improved functional ability with less pain/difficulty as well as reduce fall risk.  Baseline: 4 Goal status: INITIAL  3.  Patient will score at least 60/80 on LEFS to signify clinically meaningful improvement in functional abilities.   Baseline: 19/56 Goal status: INITIAL  4.  Increase AROM R DF to 15d Baseline: 8d Goal status: INITIAL  5.  Increase R PF strength to 4/5 Baseline: 4-/5 Goal status: INITIAL     PLAN:  PT FREQUENCY: 1-2x/week  PT DURATION: 6 weeks  PLANNED INTERVENTIONS: 97110-Therapeutic exercises, 97530- Therapeutic activity, [id number]- Neuromuscular re-education, 97535- Self Care, [id number]- Manual therapy, [phone]- Gait training, Patient/Family education, Balance training, and Stair training  PLAN FOR NEXT SESSION: HEP assessment and progression, symptom modulation, and loading (isolated and/or functional). Manual therapy, aerobic, gait, and NME training as needed. R ankle strength and control, BL hip and BL LE strengthening.     Shanda Code, SPTA 02/24/2024, 11:07 AM

## 2024-02-25 ENCOUNTER — Ambulatory Visit

## 2024-02-25 NOTE — Progress Notes (Signed)
  Subjective:  Patient ID: Marcus Wilkins, male    DOB: November 09, 1965,  MRN: [id number]  Chief Complaint  Patient presents with   Tendonitis    One minute it feels like it's getting better but then when I walk on it at night while at work, it's a totally different story.    58 y.o. male presents with the above complaint. History confirmed with patient.  He says is about the same, has been doing physical therapy and feels better after but as soon as he goes back to work and gets on his foot against her to hurt in the same way.  Objective:  Physical Exam: warm, good capillary refill, no trophic changes or ulcerative lesions, normal DP and PT pulses, normal sensory exam, onychomycosis, and still pain and tenderness around the insertion of the TA tendon and PT tendon.   Prior radiographs shows mild midfoot degenerative changes no fracture accessory navicular   Assessment:   1. Nontraumatic rupture of right posterior tibial tendon      Plan:  Patient was evaluated and treated and all questions answered.  Has been in physical therapy for 6 weeks.  Injury started 4 months ago.  Has had minimal meaningful progress.  He will continue to utilize bracing I did recommend an MRI at this point to evaluate for tendon tearing of the posterior tibial tendon.  He will follow-up me after the study to reevaluate options.  No follow-ups on file.

## 2024-02-27 ENCOUNTER — Telehealth: Payer: Self-pay | Admitting: Podiatry

## 2024-02-27 ENCOUNTER — Ambulatory Visit

## 2024-02-27 DIAGNOSIS — M6281 Muscle weakness (generalized): Secondary | ICD-10-CM | POA: Diagnosis present

## 2024-02-27 DIAGNOSIS — M25571 Pain in right ankle and joints of right foot: Secondary | ICD-10-CM | POA: Insufficient documentation

## 2024-02-27 NOTE — Telephone Encounter (Signed)
 Patient reported that when attempting to schedule the MRI, he was informed the order was written for the left ankle instead of the right. Please verify the correct side and resubmit the MRI order if needed. Thank you.

## 2024-02-27 NOTE — Addendum Note (Signed)
 Addended byBETHA MEDICINE, Tiaja Hagan R on: 02/27/2024 04:44 PM   Modules accepted: Orders

## 2024-02-27 NOTE — Therapy (Signed)
 OUTPATIENT PHYSICAL THERAPY TREATMENT NOTE   Patient Name: Marcus Wilkins MRN: [id number] DOB:09/02/1965, 58 y.o., male Today's Date: 02/27/2024  END OF SESSION:  PT End of Session - 02/27/24 1058     Visit Number 7    Number of Visits 12    Date for Recertification  04/04/24    Authorization Type [employer]    PT Start Time 1052    PT Stop Time 1125    PT Time Calculation (min) 33 min    Activity Tolerance Patient tolerated treatment well    Behavior During Therapy WFL for tasks assessed/performed           Past Medical History:  Diagnosis Date   Abdominal pain    right side radiating to left side   Allergy     DM (diabetes mellitus) (HCC)    ELECTROCARDIOGRAM, ABNORMAL 12/28/2008   Qualifier: Diagnosis of  By: Delford, MD, CODY Maude Dunnings    GERD (gastroesophageal reflux disease)    hx ulcer   H. pylori infection    Osteoarthritis    Plantar fasciitis    Post-operative nausea and vomiting    1 time   Sinusitis, chronic 12/22/2014   -hx sinus surgery, sees ENT    Past Surgical History:  Procedure Laterality Date   CHOLECYSTECTOMY N/A 06/16/2022   Procedure: LAPAROSCOPIC CHOLECYSTECTOMY;  Surgeon: Tanda Locus, MD;  Location: [facility] OR;  Service: General;  Laterality: N/A;   KNEE ARTHROSCOPY W/ MENISCAL REPAIR     bilateral   WISDOM TOOTH EXTRACTION     Patient Active Problem List   Diagnosis Date Noted   Right foot pain 10/23/2023   OSA on CPAP, mild 08/08/2023   Multinodular goiter 04/25/2023   Trapezius strain, right, initial encounter 04/25/2023   Acquired trigger finger 09/27/2021   Pain in finger 09/27/2021   Allergic urticaria 11/24/2020   Seasonal and perennial allergic rhinitis 11/24/2020   Hyperlipidemia associated with type 2 diabetes mellitus (HCC) 09/14/2016   Hypertension associated with diabetes (HCC) 09/14/2016   Diabetes mellitus without complication (HCC) 06/18/2016   Allergic rhinitis due to pollen 01/24/2015   Plantar fasciitis 07/28/2013    Osteoarthritis of both knees 07/28/2013   GERD 12/28/2008    PCP: Geofm Glade PARAS, MD   REFERRING PROVIDER: Silva Juliene SAUNDERS, DPM  REFERRING DIAG: [phone] (ICD-10-CM) - Posterior tibial tendon dysfunction (PTTD) of right lower extremity  THERAPY DIAG:  Acute right ankle pain  Muscle weakness (generalized)  Rationale for Evaluation and Treatment: Rehabilitation  ONSET DATE: 3-4 months  SUBJECTIVE:   SUBJECTIVE STATEMENT: Patient reports that he feels much better today, he isn't having any pain today.     Foot Pain      [name] Patient is here to f/u on PTTD. Pt states using the support brace with little relief.      58 y.o. male presents with the above complaint. History confirmed with patient.  He has been using the Tri-Lock brace not quite as intense of the pain as it was able to work now. Pain ongoing 3-4 months, insidious onset, localized to R arch and dorsum of foot, no better since onset, bracing ineffective. PERTINENT HISTORY: Has had some improvement continue utilize the Tri-Lock ankle brace and anti-inflammatories as needed.  I recommended formal physical therapy for strengthening balance and stability training referral was sent for this.  Return in 6 weeks to reevaluate if no improvement or worsens before then would recommend MRI and surgical considerations.   Return in about 6  weeks (around 02/24/2024) for f/u PTTD.    PAIN:   Are you having pain? Yes: NPRS scale: 10/10 Pain location: R ankle/foot Pain description: sharp, ache Aggravating factors: initial step, stretch Relieving factors: rest  PRECAUTIONS: None  RED FLAGS: None   WEIGHT BEARING RESTRICTIONS: No  FALLS:  Has patient fallen in last 6 months? No   OCCUPATION: paramedic  PLOF: Independent  PATIENT GOALS: To manage my ankle pain  NEXT MD VISIT: 02/25/24  OBJECTIVE:  Note: Objective measures were completed at Evaluation unless otherwise noted.  DIAGNOSTIC FINDINGS: none  PATIENT  SURVEYS:  LEFS 19/56 (did not fill out 3rd page)  MUSCLE LENGTH: Not tested  POSTURE: pes planus, talar tilt  PALPATION: Mild TTP posterior tibialis    LOWER EXTREMITY ROM:  Passive ROM Right eval Left eval  Hip flexion    Hip extension    Hip abduction    Hip adduction    Hip internal rotation    Hip external rotation    Knee flexion    Knee extension    Ankle dorsiflexion 8/10d   Ankle plantarflexion WFL   Ankle inversion    Ankle eversion     (Blank rows = not tested)  LOWER EXTREMITY MMT:  MMT Right eval Left eval  Hip flexion    Hip extension    Hip abduction    Hip adduction    Hip internal rotation    Hip external rotation    Knee flexion    Knee extension    Ankle dorsiflexion    Ankle plantarflexion 4-   Ankle inversion    Ankle eversion     (Blank rows = not tested)  LOWER EXTREMITY SPECIAL TESTS:  Ankle special tests: Talar tilt test: positive , Tinel's test-Posterior tibialis: negative, and positive pes planus  FUNCTIONAL TESTS:  30 seconds chair stand test 4  GAIT: Distance walked: 59ftx2 Assistive device utilized: None Level of assistance: Complete Independence Comments: slower cadence, slightly antalgic                                                                                                                                TREATMENT:   [facility] Adult PT Treatment:                                                DATE: 02/27/2024  Therapeutic Exercise: Nustep L4 8 min Gastroc stretch with slant board 2x30  Neuromuscular re-ed: Standing heel raises 2x15 Seated arch raises x 20 Seated dynadisc PF/DF x25 Seated dynadisc inv/ev x25 Seated DF toe raises 2x15 Soleus heel raise 15# x50    [facility] Adult PT Treatment:  DATE: 02/19/24 Therapeutic Exercise: Gastroc stretch with slant board 2x30 Neuromuscular re-ed: Standing heel raises 3x8 Seated arch raises 2x15 Seated rocker board  PF/DF/inv/ev x25 Seated DF toe raises 2x15 Soleus heel raise 15# x50  [facility] Adult PT Treatment:                                                DATE: 02/18/24  Therapeutic Exercise: Nustep L4 8 min  Manual Therapy: R subtalar and talocrural mobs to promote DF and supination  Neuromuscular re-ed: Soleus heel raise 15# x50 R gastroc stretch in supinated position 30s x 3  PF stretch 60s R Seated arch lifting 2 x 15  B heel raise in supinated position 15x2       PATIENT EDUCATION:  Education details: Discussed eval findings, rehab rationale and POC and patient is in agreement  Person educated: Patient Education method: Explanation and Handouts Education comprehension: verbalized understanding and needs further education  HOME EXERCISE PROGRAM: Access Code: [email or web address] URL: [email or web address] Date: 02/03/2024 Prepared by: Reyes Kohut  Exercises - Long Sitting Plantar Fascia Stretch with Towel  - 2-3 x daily - 5 x weekly - 1 sets - 2 reps - 30s hold - Ankle Inversion Eversion Towel Slide  - 2-3 x daily - 5 x weekly - 2 sets - 10 reps - Towel Scrunches  - 2-3 x daily - 5 x weekly - 2 sets - 15 reps Red TB circles CW/CCW Red tb PF    ASSESSMENT:  CLINICAL IMPRESSION:   Patient tolerated today's exercises well. States that he would like to pause PT after his next visit, to have MRI and f/u with Dr. Silva. We will  progress exercises as appropriate and review HEP at next visit.     EVAL: Patient is a 58 y.o. male who was seen today for physical therapy evaluation and treatment for R foot/ankle pain.  Patient presents with pes planus and talar tilt dysfunction.  ROM and strength deficits identified.  TTP along posterior tibialis tendon.  Patient unable to supinate in SLS or unilateral heel raise.  Patient is a good rehab candidate and may benefit from arch supports and more supportive footwear.   OBJECTIVE IMPAIRMENTS: Abnormal gait, decreased activity  tolerance, decreased knowledge of condition, decreased mobility, difficulty walking, decreased ROM, decreased strength, hypomobility, postural dysfunction, and pain.   ACTIVITY LIMITATIONS: standing and stairs  PERSONAL FACTORS: Age, Fitness, Past/current experiences, and Time since onset of injury/illness/exacerbation are also affecting patient's functional outcome.   REHAB POTENTIAL: Good  CLINICAL DECISION MAKING: Stable/uncomplicated  EVALUATION COMPLEXITY: Moderate   GOALS: Goals reviewed with patient? No  SHORT TERM GOALS: Target date: 02/24/2024   Patient to demonstrate independence in HEP  Baseline: [id number] Goal status: INITIAL  2.  Patient to bring in footwear for assessment of support and cushioning Baseline: TBD Goal status: INITIAL   LONG TERM GOALS: Target date: 03/30/2024    Patient will acknowledge 4/10 pain at least once during episode of care   Baseline: 10/10 worst pain Goal status: INITIAL  2.  Patient will increase 30s chair stand reps from 4 to 10 without arms to demonstrate and improved functional ability with less pain/difficulty as well as reduce fall risk.  Baseline: 4 Goal status: INITIAL  3.  Patient will score at least 60/80 on LEFS to signify clinically meaningful improvement in functional abilities.  Baseline: 19/56 Goal status: INITIAL  4.  Increase AROM R DF to 15d Baseline: 8d Goal status: INITIAL  5.  Increase R PF strength to 4/5 Baseline: 4-/5 Goal status: INITIAL     PLAN:  PT FREQUENCY: 1-2x/week  PT DURATION: 6 weeks  PLANNED INTERVENTIONS: 97110-Therapeutic exercises, 97530- Therapeutic activity, [id number]- Neuromuscular re-education, 97535- Self Care, 02859- Manual therapy, [phone]- Gait training, Patient/Family education, Balance training, and Stair training  PLAN FOR NEXT SESSION: HEP assessment and progression, symptom modulation, and loading (isolated and/or functional). Manual therapy, aerobic, gait, and NME training  as needed. R ankle strength and control, BL hip and BL LE strengthening.     Marko Molt, PT, DPT  02/27/2024 1:23 PM

## 2024-03-02 ENCOUNTER — Encounter: Payer: Self-pay | Admitting: Podiatry

## 2024-03-03 ENCOUNTER — Encounter: Payer: Self-pay | Admitting: Podiatry

## 2024-03-04 ENCOUNTER — Ambulatory Visit

## 2024-03-04 DIAGNOSIS — M25571 Pain in right ankle and joints of right foot: Secondary | ICD-10-CM

## 2024-03-04 DIAGNOSIS — M6281 Muscle weakness (generalized): Secondary | ICD-10-CM

## 2024-03-04 NOTE — Therapy (Signed)
 OUTPATIENT PHYSICAL THERAPY TREATMENT NOTE   Patient Name: Marcus Wilkins MRN: [id number] DOB:[birth year], 58 y.o., male Today's Date: 03/04/2024  END OF SESSION:  PT End of Session - 03/04/24 1140     Visit Number 8    Number of Visits 12    Date for Recertification  04/04/24    Authorization Type [employer]    PT Start Time 1140    PT Stop Time 1218    PT Time Calculation (min) 38 min    Activity Tolerance Patient tolerated treatment well    Behavior During Therapy WFL for tasks assessed/performed          Past Medical History:  Diagnosis Date   Abdominal pain    right side radiating to left side   Allergy     DM (diabetes mellitus) (HCC)    ELECTROCARDIOGRAM, ABNORMAL 12/28/2008   Qualifier: Diagnosis of  By: Delford, MD, CODY Maude Dunnings    GERD (gastroesophageal reflux disease)    hx ulcer   H. pylori infection    Osteoarthritis    Plantar fasciitis    Post-operative nausea and vomiting    1 time   Sinusitis, chronic 12/22/2014   -hx sinus surgery, sees ENT    Past Surgical History:  Procedure Laterality Date   CHOLECYSTECTOMY N/A 06/16/2022   Procedure: LAPAROSCOPIC CHOLECYSTECTOMY;  Surgeon: Tanda Locus, MD;  Location: [facility] OR;  Service: General;  Laterality: N/A;   KNEE ARTHROSCOPY W/ MENISCAL REPAIR     bilateral   WISDOM TOOTH EXTRACTION     Patient Active Problem List   Diagnosis Date Noted   Right foot pain 10/23/2023   OSA on CPAP, mild 08/08/2023   Multinodular goiter 04/25/2023   Trapezius strain, right, initial encounter 04/25/2023   Acquired trigger finger 09/27/2021   Pain in finger 09/27/2021   Allergic urticaria 11/24/2020   Seasonal and perennial allergic rhinitis 11/24/2020   Hyperlipidemia associated with type 2 diabetes mellitus (HCC) 09/14/2016   Hypertension associated with diabetes (HCC) 09/14/2016   Diabetes mellitus without complication (HCC) 06/18/2016   Allergic rhinitis due to pollen 01/24/2015   Plantar fasciitis 07/28/2013    Osteoarthritis of both knees 07/28/2013   GERD 12/28/2008    PCP: Geofm Glade PARAS, MD   REFERRING PROVIDER: Silva Juliene SAUNDERS, DPM  REFERRING DIAG: [phone] (ICD-10-CM) - Posterior tibial tendon dysfunction (PTTD) of right lower extremity  THERAPY DIAG:  Acute right ankle pain  Muscle weakness (generalized)  Rationale for Evaluation and Treatment: Rehabilitation  ONSET DATE: 3-4 months  SUBJECTIVE:   SUBJECTIVE STATEMENT: Patient reports that he is having some pain today, no issues after last session. He has MRI scheduled for 03/10/24.     Foot Pain      [name] Patient is here to f/u on PTTD. Pt states using the support brace with little relief.      EVAL: 58 y.o. male presents with the above complaint. History confirmed with patient.  He has been using the Tri-Lock brace not quite as intense of the pain as it was able to work now. Pain ongoing 3-4 months, insidious onset, localized to R arch and dorsum of foot, no better since onset, bracing ineffective.  PERTINENT HISTORY: Has had some improvement continue utilize the Tri-Lock ankle brace and anti-inflammatories as needed.  I recommended formal physical therapy for strengthening balance and stability training referral was sent for this.  Return in 6 weeks to reevaluate if no improvement or worsens before then would recommend MRI and surgical  considerations.   Return in about 6 weeks (around 02/24/2024) for f/u PTTD.    PAIN:   Are you having pain? Yes: NPRS scale: 10/10 Pain location: R ankle/foot Pain description: sharp, ache Aggravating factors: initial step, stretch Relieving factors: rest  PRECAUTIONS: None  RED FLAGS: None   WEIGHT BEARING RESTRICTIONS: No  FALLS:  Has patient fallen in last 6 months? No   OCCUPATION: paramedic  PLOF: Independent  PATIENT GOALS: To manage my ankle pain  NEXT MD VISIT: 02/25/24  OBJECTIVE:  Note: Objective measures were completed at Evaluation unless otherwise  noted.  DIAGNOSTIC FINDINGS: none  PATIENT SURVEYS:  LEFS 19/56 (did not fill out 3rd page)  MUSCLE LENGTH: Not tested  POSTURE: pes planus, talar tilt  PALPATION: Mild TTP posterior tibialis    LOWER EXTREMITY ROM:  Passive ROM Right eval Left eval  Hip flexion    Hip extension    Hip abduction    Hip adduction    Hip internal rotation    Hip external rotation    Knee flexion    Knee extension    Ankle dorsiflexion 8/10d   Ankle plantarflexion WFL   Ankle inversion    Ankle eversion     (Blank rows = not tested)  LOWER EXTREMITY MMT:  MMT Right eval Left eval  Hip flexion    Hip extension    Hip abduction    Hip adduction    Hip internal rotation    Hip external rotation    Knee flexion    Knee extension    Ankle dorsiflexion    Ankle plantarflexion 4-   Ankle inversion    Ankle eversion     (Blank rows = not tested)  LOWER EXTREMITY SPECIAL TESTS:  Ankle special tests: Talar tilt test: positive , Tinel's test-Posterior tibialis: negative, and positive pes planus  FUNCTIONAL TESTS:  30 seconds chair stand test 4  GAIT: Distance walked: 66ftx2 Assistive device utilized: None Level of assistance: Complete Independence Comments: slower cadence, slightly antalgic                                                                                                                               TREATMENT: [facility] Adult PT Treatment:                                                DATE: 03/04/24 Therapeutic Exercise: Nustep L4 8 min Gastroc stretch with slant board 2x1' Neuromuscular re-ed: Standing heel raises x15 (painful today) Seated dynadisc PF/DF x25 Seated dynadisc inv/ev x25 Seated DF toe raises 2x15 Soleus heel raise 2 step 15# KB above knee x50  [facility] Adult PT Treatment:  DATE: 02/27/2024  Therapeutic Exercise: Nustep L4 8 min Gastroc stretch with slant board 2x30  Neuromuscular  re-ed: Standing heel raises 2x15 Seated arch raises x 20 Seated dynadisc PF/DF x25 Seated dynadisc inv/ev x25 Seated DF toe raises 2x15 Soleus heel raise 15# x50    [facility] Adult PT Treatment:                                                DATE: 02/19/24 Therapeutic Exercise: Gastroc stretch with slant board 2x30 Neuromuscular re-ed: Standing heel raises 3x8 Seated arch raises 2x15 Seated rocker board PF/DF/inv/ev x25 Seated DF toe raises 2x15 Soleus heel raise 15# x50   PATIENT EDUCATION:  Education details: Discussed eval findings, rehab rationale and POC and patient is in agreement  Person educated: Patient Education method: Explanation and Handouts Education comprehension: verbalized understanding and needs further education  HOME EXERCISE PROGRAM: Access Code: [email or web address] URL: [email or web address] Date: 02/03/2024 Prepared by: Reyes Kohut  Exercises - Long Sitting Plantar Fascia Stretch with Towel  - 2-3 x daily - 5 x weekly - 1 sets - 2 reps - 30s hold - Ankle Inversion Eversion Towel Slide  - 2-3 x daily - 5 x weekly - 2 sets - 10 reps - Towel Scrunches  - 2-3 x daily - 5 x weekly - 2 sets - 15 reps Red TB circles CW/CCW Red tb PF    ASSESSMENT:  CLINICAL IMPRESSION:   Patient reports that he is having more pain today, has been compliant with HEP but feels like the exercises thus far has not been very helpful overall. He has an MRI scheduled for 12/16 and will follow up with Dr. Silva afterwards. Session today continued to focus on distal LE strengthening and stretching. Reviewed HEP. Will pause PT for the time being until f/u appointment.    EVAL: Patient is a 58 y.o. male who was seen today for physical therapy evaluation and treatment for R foot/ankle pain.  Patient presents with pes planus and talar tilt dysfunction.  ROM and strength deficits identified.  TTP along posterior tibialis tendon.  Patient unable to supinate in SLS or unilateral  heel raise.  Patient is a good rehab candidate and may benefit from arch supports and more supportive footwear.   OBJECTIVE IMPAIRMENTS: Abnormal gait, decreased activity tolerance, decreased knowledge of condition, decreased mobility, difficulty walking, decreased ROM, decreased strength, hypomobility, postural dysfunction, and pain.   ACTIVITY LIMITATIONS: standing and stairs  PERSONAL FACTORS: Age, Fitness, Past/current experiences, and Time since onset of injury/illness/exacerbation are also affecting patient's functional outcome.   REHAB POTENTIAL: Good  CLINICAL DECISION MAKING: Stable/uncomplicated  EVALUATION COMPLEXITY: Moderate   GOALS: Goals reviewed with patient? No  SHORT TERM GOALS: Target date: 02/24/2024   Patient to demonstrate independence in HEP  Baseline: [email or web address] Goal status: MET 03/04/24: Pt reports adherence  2.  Patient to bring in footwear for assessment of support and cushioning Baseline: TBD Goal status: INITIAL   LONG TERM GOALS: Target date: 03/30/2024    Patient will acknowledge 4/10 pain at least once during episode of care   Baseline: 10/10 worst pain Goal status: INITIAL  2.  Patient will increase 30s chair stand reps from 4 to 10 without arms to demonstrate and improved functional ability with less pain/difficulty as well as reduce fall risk.  Baseline: 4 Goal status: INITIAL  3.  Patient  will score at least 60/80 on LEFS to signify clinically meaningful improvement in functional abilities.   Baseline: 19/56 Goal status: INITIAL  4.  Increase AROM R DF to 15d Baseline: 8d Goal status: INITIAL  5.  Increase R PF strength to 4/5 Baseline: 4-/5 Goal status: INITIAL     PLAN:  PT FREQUENCY: 1-2x/week  PT DURATION: 6 weeks  PLANNED INTERVENTIONS: 97110-Therapeutic exercises, 97530- Therapeutic activity, [id number]- Neuromuscular re-education, 97535- Self Care, [id number]- Manual therapy, [phone]- Gait training, Patient/Family education,  Balance training, and Stair training  PLAN FOR NEXT SESSION: HEP assessment and progression, symptom modulation, and loading (isolated and/or functional). Manual therapy, aerobic, gait, and NME training as needed. R ankle strength and control, BL hip and BL LE strengthening.     Corean Pouch PTA  03/04/2024 12:19 PM

## 2024-03-10 ENCOUNTER — Inpatient Hospital Stay: Admission: RE | Admit: 2024-03-10 | Discharge: 2024-03-10 | Attending: Podiatry

## 2024-03-10 DIAGNOSIS — M66871 Spontaneous rupture of other tendons, right ankle and foot: Secondary | ICD-10-CM

## 2024-03-12 ENCOUNTER — Encounter: Payer: Self-pay | Admitting: Podiatry

## 2024-03-12 ENCOUNTER — Ambulatory Visit: Admitting: Podiatry

## 2024-03-12 VITALS — Ht 73.5 in | Wt 213.0 lb

## 2024-03-12 DIAGNOSIS — M76821 Posterior tibial tendinitis, right leg: Secondary | ICD-10-CM

## 2024-03-12 DIAGNOSIS — R937 Abnormal findings on diagnostic imaging of other parts of musculoskeletal system: Secondary | ICD-10-CM | POA: Diagnosis not present

## 2024-03-15 NOTE — Progress Notes (Signed)
"  °  Subjective:  Patient ID: Marcus Wilkins, male    DOB: 1965-12-09,  MRN: [id number]  Chief Complaint  Patient presents with   Foot Problem    Rm 20 Patient is here for MRI results    58 y.o. male presents with the above complaint. History confirmed with patient.  He says is about the same, he completed the MRI   Objective:  Physical Exam: warm, good capillary refill, no trophic changes or ulcerative lesions, normal DP and PT pulses, normal sensory exam, onychomycosis, and still pain and tenderness around the insertion of the TA tendon and PT tendon.   Prior radiographs shows mild midfoot degenerative changes no fracture accessory navicular  Narrative & Impression  CLINICAL DATA:  Right ankle pain.   EXAM: MRI OF THE RIGHT ANKLE WITHOUT CONTRAST   TECHNIQUE: Multiplanar, multisequence MR imaging of the ankle was performed. No intravenous contrast was administered.   COMPARISON:  Right foot x-ray 11/03/2023   FINDINGS: TENDONS   Peroneal: Peroneal longus tendon intact. Peroneal brevis intact.   Posteromedial: Posterior tibial tendon intact. Flexor hallucis longus tendon intact. Flexor digitorum longus tendon intact.   Anterior: Tibialis anterior tendon intact. Extensor hallucis longus tendon intact Extensor digitorum longus tendon intact.   Achilles:  Intact.   Plantar Fascia: Mild plantar fasciitis of the central and lateral cord of the plantar fascia towards the calcaneal insertion. Small plantar calcaneal enthesophyte.   LIGAMENTS   Lateral: Anterior talofibular ligament intact. Calcaneofibular ligament intact. Posterior talofibular ligament intact. Anterior and posterior tibiofibular ligaments intact.   Medial: Deltoid ligament intact. Spring ligament intact.   CARTILAGE   Ankle Joint: No joint effusion. Normal ankle mortise. No chondral defect.   Subtalar Joints/Sinus Tarsi: Normal subtalar joints. No subtalar joint effusion. Normal sinus tarsi.    Bones: No aggressive osseous lesion. No fracture or dislocation. Mild marrow edema in the medial aspect of the navicular, medial cuneiform, lateral cuneiform, middle cuneiform, distal dorsal cuboid, second metatarsal base, third metatarsal base, and proximal fourth metatarsal diaphysis concerning for stress reaction. Mild osteoarthritis of the calcaneocuboid joint.   Soft Tissue: No fluid collection or hematoma. Muscles are normal without edema or atrophy. Tarsal tunnel is normal.   IMPRESSION: 1. Mild plantar fasciitis of the central and lateral cord of the plantar fascia towards the calcaneal insertion. 2. Mild marrow edema in the medial aspect of the navicular, medial cuneiform, lateral cuneiform, middle cuneiform, distal dorsal cuboid, second metatarsal base, third metatarsal base, and proximal fourth metatarsal diaphysis concerning for stress reaction.     Electronically Signed   By: Julaine Blanch M.D.   On: 03/10/2024 13:12   Assessment:   1. Bone marrow edema   2. Posterior tibial tendon dysfunction (PTTD) of right lower extremity      Plan:  Patient was evaluated and treated and all questions answered.  Discussed MRI findings primarily consistent with a bone marrow edema syndrome and treatment options we discussed RICE protocol and I recommended rest and a cam walker boot.  This will take him out of work and he will file for FMLA and short-term disability.  Follow-up with me in 6 weeks to reevaluate.  No follow-ups on file.   "

## 2024-03-20 ENCOUNTER — Other Ambulatory Visit

## 2024-03-23 DIAGNOSIS — Z0279 Encounter for issue of other medical certificate: Secondary | ICD-10-CM

## 2024-03-24 ENCOUNTER — Telehealth: Payer: Self-pay | Admitting: Podiatry

## 2024-03-24 NOTE — Telephone Encounter (Signed)
 no mess can be lft on pt's vmail. Forms are completed and we need to know where to send them or if he will pick up from office

## 2024-03-24 NOTE — Telephone Encounter (Signed)
 Pt came into [place] office and picked up forms Ph was vrf- see prev notes of mess could not be left for him.

## 2024-04-21 ENCOUNTER — Ambulatory Visit (INDEPENDENT_AMBULATORY_CARE_PROVIDER_SITE_OTHER): Admitting: Podiatry

## 2024-04-21 ENCOUNTER — Encounter: Payer: Self-pay | Admitting: Podiatry

## 2024-04-21 VITALS — Ht 73.5 in | Wt 213.0 lb

## 2024-04-21 DIAGNOSIS — R937 Abnormal findings on diagnostic imaging of other parts of musculoskeletal system: Secondary | ICD-10-CM

## 2024-04-21 DIAGNOSIS — Z0279 Encounter for issue of other medical certificate: Secondary | ICD-10-CM

## 2024-04-21 MED ORDER — PREDNISONE 10 MG PO TABS: ORAL_TABLET | ORAL | 0 refills | Status: AC

## 2024-04-21 NOTE — Progress Notes (Signed)
"  °  Subjective:  Patient ID: CHRISTINA WALDROP, male    DOB: [birth year],  MRN: [id number]  Chief Complaint  Patient presents with   Foot Problem    Rm 2 Patient is here to f/u on PTTD and marrow edema of the right foot. Pt states some improvement in pain while wearing walking boot.    59 y.o. male presents with the above complaint. History confirmed with patient.  He some improvement with the boot but pain has not gone away  Objective:  Physical Exam: warm, good capillary refill, no trophic changes or ulcerative lesions, normal DP and PT pulses, normal sensory exam, onychomycosis, and still pain and tenderness around the insertion of the TA tendon and PT tendon.  Improved in severity but location is unchanged   Prior radiographs shows mild midfoot degenerative changes no fracture accessory navicular  Narrative & Impression  CLINICAL DATA:  Right ankle pain.   EXAM: MRI OF THE RIGHT ANKLE WITHOUT CONTRAST   TECHNIQUE: Multiplanar, multisequence MR imaging of the ankle was performed. No intravenous contrast was administered.   COMPARISON:  Right foot x-ray 11/03/2023   FINDINGS: TENDONS   Peroneal: Peroneal longus tendon intact. Peroneal brevis intact.   Posteromedial: Posterior tibial tendon intact. Flexor hallucis longus tendon intact. Flexor digitorum longus tendon intact.   Anterior: Tibialis anterior tendon intact. Extensor hallucis longus tendon intact Extensor digitorum longus tendon intact.   Achilles:  Intact.   Plantar Fascia: Mild plantar fasciitis of the central and lateral cord of the plantar fascia towards the calcaneal insertion. Small plantar calcaneal enthesophyte.   LIGAMENTS   Lateral: Anterior talofibular ligament intact. Calcaneofibular ligament intact. Posterior talofibular ligament intact. Anterior and posterior tibiofibular ligaments intact.   Medial: Deltoid ligament intact. Spring ligament intact.   CARTILAGE   Ankle Joint: No joint effusion.  Normal ankle mortise. No chondral defect.   Subtalar Joints/Sinus Tarsi: Normal subtalar joints. No subtalar joint effusion. Normal sinus tarsi.   Bones: No aggressive osseous lesion. No fracture or dislocation. Mild marrow edema in the medial aspect of the navicular, medial cuneiform, lateral cuneiform, middle cuneiform, distal dorsal cuboid, second metatarsal base, third metatarsal base, and proximal fourth metatarsal diaphysis concerning for stress reaction. Mild osteoarthritis of the calcaneocuboid joint.   Soft Tissue: No fluid collection or hematoma. Muscles are normal without edema or atrophy. Tarsal tunnel is normal.   IMPRESSION: 1. Mild plantar fasciitis of the central and lateral cord of the plantar fascia towards the calcaneal insertion. 2. Mild marrow edema in the medial aspect of the navicular, medial cuneiform, lateral cuneiform, middle cuneiform, distal dorsal cuboid, second metatarsal base, third metatarsal base, and proximal fourth metatarsal diaphysis concerning for stress reaction.     Electronically Signed   By: Julaine Blanch M.D.   On: 03/10/2024 13:12   Assessment:   1. Bone marrow edema      Plan:  Patient was evaluated and treated and all questions answered.  Has had some improvement but has not resolved fully.  I recommended continue weightbearing as tolerated in the cam walker boot and transition back to regular supportive shoe gear gradually over the next couple of weeks.  I did recommend a steroid taper utilizing we discussed the risk of hyperglycemia.  Rx for prednisone  taper sent to pharmacy.  Return in 4 weeks to reevaluate.  He will remain out of work until this time.  Return in about 4 weeks (around 05/19/2024) for f/u on right foot pain (new xrays).   "

## 2024-04-22 ENCOUNTER — Telehealth: Payer: Self-pay | Admitting: Podiatry

## 2024-04-22 NOTE — Telephone Encounter (Signed)
 tried to call to let him know the forms are ready for pick up at [facility] office and can't leave a mess for him. I will email him to adv he can pick up them

## 2024-05-04 ENCOUNTER — Encounter: Admitting: Internal Medicine

## 2024-05-13 ENCOUNTER — Telehealth: Admitting: Neurology

## 2024-05-26 ENCOUNTER — Ambulatory Visit: Admitting: Podiatry
